# Patient Record
Sex: Male | Born: 1955 | Race: Black or African American | Hispanic: No | Marital: Single | Smoking: Never smoker
Health system: Southern US, Community
[De-identification: ages and names within clinical notes are randomized; demographics above are authoritative.]

---

## 2019-08-16 ENCOUNTER — Inpatient Hospital Stay (HOSPITAL_COMMUNITY): Payer: Self-pay

## 2019-08-16 ENCOUNTER — Inpatient Hospital Stay (HOSPITAL_COMMUNITY)
Admission: EM | Admit: 2019-08-16 | Discharge: 2019-09-12 | DRG: 870 | Disposition: E | Payer: Self-pay | Attending: Internal Medicine | Admitting: Internal Medicine

## 2019-08-16 ENCOUNTER — Emergency Department (HOSPITAL_COMMUNITY): Payer: Self-pay

## 2019-08-16 ENCOUNTER — Ambulatory Visit (HOSPITAL_COMMUNITY): Admit: 2019-08-16 | Payer: Self-pay | Admitting: Cardiology

## 2019-08-16 ENCOUNTER — Other Ambulatory Visit (HOSPITAL_COMMUNITY): Payer: Self-pay

## 2019-08-16 ENCOUNTER — Inpatient Hospital Stay (HOSPITAL_COMMUNITY)
Admission: EM | Admit: 2019-08-16 | Discharge: 2019-08-16 | Disposition: A | Discharge status: Still In Hospital | Payer: Self-pay | Source: Home / Self Care | Attending: Pulmonary Disease | Admitting: Pulmonary Disease

## 2019-08-16 ENCOUNTER — Encounter (HOSPITAL_COMMUNITY)
Admission: EM | Disposition: A | Discharge status: Still In Hospital | Payer: Self-pay | Source: Home / Self Care | Attending: Pulmonary Disease

## 2019-08-16 DIAGNOSIS — T85528A Displacement of other gastrointestinal prosthetic devices, implants and grafts, initial encounter: Secondary | ICD-10-CM

## 2019-08-16 DIAGNOSIS — Z452 Encounter for adjustment and management of vascular access device: Secondary | ICD-10-CM

## 2019-08-16 DIAGNOSIS — R0602 Shortness of breath: Secondary | ICD-10-CM

## 2019-08-16 DIAGNOSIS — E872 Acidosis: Secondary | ICD-10-CM | POA: Diagnosis present

## 2019-08-16 DIAGNOSIS — Z9889 Other specified postprocedural states: Secondary | ICD-10-CM

## 2019-08-16 DIAGNOSIS — R6521 Severe sepsis with septic shock: Secondary | ICD-10-CM | POA: Diagnosis present

## 2019-08-16 DIAGNOSIS — J690 Pneumonitis due to inhalation of food and vomit: Secondary | ICD-10-CM | POA: Diagnosis present

## 2019-08-16 DIAGNOSIS — R410 Disorientation, unspecified: Secondary | ICD-10-CM | POA: Diagnosis present

## 2019-08-16 DIAGNOSIS — J156 Pneumonia due to other aerobic Gram-negative bacteria: Secondary | ICD-10-CM | POA: Diagnosis present

## 2019-08-16 DIAGNOSIS — R404 Transient alteration of awareness: Secondary | ICD-10-CM

## 2019-08-16 DIAGNOSIS — J9383 Other pneumothorax: Secondary | ICD-10-CM | POA: Diagnosis present

## 2019-08-16 DIAGNOSIS — I742 Embolism and thrombosis of arteries of the upper extremities: Secondary | ICD-10-CM | POA: Diagnosis not present

## 2019-08-16 DIAGNOSIS — N179 Acute kidney failure, unspecified: Secondary | ICD-10-CM

## 2019-08-16 DIAGNOSIS — I469 Cardiac arrest, cause unspecified: Secondary | ICD-10-CM | POA: Diagnosis present

## 2019-08-16 DIAGNOSIS — Z6823 Body mass index (BMI) 23.0-23.9, adult: Secondary | ICD-10-CM

## 2019-08-16 DIAGNOSIS — Z7289 Other problems related to lifestyle: Secondary | ICD-10-CM

## 2019-08-16 DIAGNOSIS — I96 Gangrene, not elsewhere classified: Secondary | ICD-10-CM | POA: Diagnosis not present

## 2019-08-16 DIAGNOSIS — Z4682 Encounter for fitting and adjustment of non-vascular catheter: Secondary | ICD-10-CM

## 2019-08-16 DIAGNOSIS — A4153 Sepsis due to Serratia: Principal | ICD-10-CM | POA: Diagnosis present

## 2019-08-16 DIAGNOSIS — R680 Hypothermia, not associated with low environmental temperature: Secondary | ICD-10-CM | POA: Diagnosis present

## 2019-08-16 DIAGNOSIS — J948 Other specified pleural conditions: Secondary | ICD-10-CM

## 2019-08-16 DIAGNOSIS — Y838 Other surgical procedures as the cause of abnormal reaction of the patient, or of later complication, without mention of misadventure at the time of the procedure: Secondary | ICD-10-CM | POA: Diagnosis not present

## 2019-08-16 DIAGNOSIS — R6520 Severe sepsis without septic shock: Secondary | ICD-10-CM | POA: Diagnosis present

## 2019-08-16 DIAGNOSIS — R7401 Elevation of levels of liver transaminase levels: Secondary | ICD-10-CM | POA: Diagnosis present

## 2019-08-16 DIAGNOSIS — D649 Anemia, unspecified: Secondary | ICD-10-CM | POA: Diagnosis present

## 2019-08-16 DIAGNOSIS — E876 Hypokalemia: Secondary | ICD-10-CM | POA: Diagnosis not present

## 2019-08-16 DIAGNOSIS — Z9289 Personal history of other medical treatment: Secondary | ICD-10-CM

## 2019-08-16 DIAGNOSIS — J939 Pneumothorax, unspecified: Secondary | ICD-10-CM

## 2019-08-16 DIAGNOSIS — R451 Restlessness and agitation: Secondary | ICD-10-CM | POA: Diagnosis not present

## 2019-08-16 DIAGNOSIS — E43 Unspecified severe protein-calorie malnutrition: Secondary | ICD-10-CM

## 2019-08-16 DIAGNOSIS — L89811 Pressure ulcer of head, stage 1: Secondary | ICD-10-CM | POA: Diagnosis not present

## 2019-08-16 DIAGNOSIS — J9690 Respiratory failure, unspecified, unspecified whether with hypoxia or hypercapnia: Secondary | ICD-10-CM

## 2019-08-16 DIAGNOSIS — J9601 Acute respiratory failure with hypoxia: Secondary | ICD-10-CM | POA: Diagnosis present

## 2019-08-16 DIAGNOSIS — N19 Unspecified kidney failure: Secondary | ICD-10-CM

## 2019-08-16 DIAGNOSIS — J9602 Acute respiratory failure with hypercapnia: Secondary | ICD-10-CM | POA: Diagnosis present

## 2019-08-16 DIAGNOSIS — G931 Anoxic brain damage, not elsewhere classified: Secondary | ICD-10-CM | POA: Diagnosis present

## 2019-08-16 DIAGNOSIS — R739 Hyperglycemia, unspecified: Secondary | ICD-10-CM | POA: Diagnosis not present

## 2019-08-16 DIAGNOSIS — R0689 Other abnormalities of breathing: Secondary | ICD-10-CM

## 2019-08-16 DIAGNOSIS — Z789 Other specified health status: Secondary | ICD-10-CM

## 2019-08-16 DIAGNOSIS — J9811 Atelectasis: Secondary | ICD-10-CM | POA: Diagnosis not present

## 2019-08-16 DIAGNOSIS — I4892 Unspecified atrial flutter: Secondary | ICD-10-CM | POA: Diagnosis not present

## 2019-08-16 DIAGNOSIS — L89892 Pressure ulcer of other site, stage 2: Secondary | ICD-10-CM | POA: Diagnosis not present

## 2019-08-16 DIAGNOSIS — E162 Hypoglycemia, unspecified: Secondary | ICD-10-CM

## 2019-08-16 DIAGNOSIS — R64 Cachexia: Secondary | ICD-10-CM | POA: Diagnosis present

## 2019-08-16 DIAGNOSIS — T801XXA Vascular complications following infusion, transfusion and therapeutic injection, initial encounter: Secondary | ICD-10-CM | POA: Diagnosis not present

## 2019-08-16 DIAGNOSIS — Z515 Encounter for palliative care: Secondary | ICD-10-CM

## 2019-08-16 DIAGNOSIS — Z9911 Dependence on respirator [ventilator] status: Secondary | ICD-10-CM

## 2019-08-16 DIAGNOSIS — R791 Abnormal coagulation profile: Secondary | ICD-10-CM | POA: Diagnosis present

## 2019-08-16 DIAGNOSIS — Z20822 Contact with and (suspected) exposure to covid-19: Secondary | ICD-10-CM | POA: Diagnosis present

## 2019-08-16 DIAGNOSIS — J189 Pneumonia, unspecified organism: Secondary | ICD-10-CM

## 2019-08-16 DIAGNOSIS — G9341 Metabolic encephalopathy: Secondary | ICD-10-CM | POA: Diagnosis present

## 2019-08-16 DIAGNOSIS — R34 Anuria and oliguria: Secondary | ICD-10-CM | POA: Diagnosis present

## 2019-08-16 DIAGNOSIS — A419 Sepsis, unspecified organism: Secondary | ICD-10-CM | POA: Diagnosis present

## 2019-08-16 DIAGNOSIS — Z781 Physical restraint status: Secondary | ICD-10-CM

## 2019-08-16 DIAGNOSIS — I4891 Unspecified atrial fibrillation: Secondary | ICD-10-CM | POA: Diagnosis not present

## 2019-08-16 DIAGNOSIS — J984 Other disorders of lung: Secondary | ICD-10-CM | POA: Diagnosis present

## 2019-08-16 DIAGNOSIS — N170 Acute kidney failure with tubular necrosis: Secondary | ICD-10-CM | POA: Diagnosis present

## 2019-08-16 DIAGNOSIS — J95812 Postprocedural air leak: Secondary | ICD-10-CM | POA: Diagnosis not present

## 2019-08-16 DIAGNOSIS — I468 Cardiac arrest due to other underlying condition: Secondary | ICD-10-CM | POA: Diagnosis present

## 2019-08-16 DIAGNOSIS — R601 Generalized edema: Secondary | ICD-10-CM | POA: Diagnosis not present

## 2019-08-16 DIAGNOSIS — E870 Hyperosmolality and hypernatremia: Secondary | ICD-10-CM | POA: Diagnosis present

## 2019-08-16 DIAGNOSIS — G319 Degenerative disease of nervous system, unspecified: Secondary | ICD-10-CM | POA: Diagnosis present

## 2019-08-16 DIAGNOSIS — J9600 Acute respiratory failure, unspecified whether with hypoxia or hypercapnia: Secondary | ICD-10-CM

## 2019-08-16 DIAGNOSIS — Z9689 Presence of other specified functional implants: Secondary | ICD-10-CM

## 2019-08-16 DIAGNOSIS — F919 Conduct disorder, unspecified: Secondary | ICD-10-CM | POA: Diagnosis present

## 2019-08-16 DIAGNOSIS — Z66 Do not resuscitate: Secondary | ICD-10-CM | POA: Diagnosis not present

## 2019-08-16 DIAGNOSIS — R0600 Dyspnea, unspecified: Secondary | ICD-10-CM

## 2019-08-16 DIAGNOSIS — L8990 Pressure ulcer of unspecified site, unspecified stage: Secondary | ICD-10-CM | POA: Insufficient documentation

## 2019-08-16 DIAGNOSIS — T82594A Other mechanical complication of infusion catheter, initial encounter: Secondary | ICD-10-CM

## 2019-08-16 DIAGNOSIS — J90 Pleural effusion, not elsewhere classified: Secondary | ICD-10-CM | POA: Diagnosis present

## 2019-08-16 DIAGNOSIS — K7201 Acute and subacute hepatic failure with coma: Secondary | ICD-10-CM | POA: Diagnosis present

## 2019-08-16 LAB — CBC WITH DIFFERENTIAL/PLATELET
Abs Immature Granulocytes: 0.4 10*3/uL — ABNORMAL HIGH (ref 0.00–0.07)
Basophils Absolute: 0 10*3/uL (ref 0.0–0.1)
Basophils Relative: 0 %
Eosinophils Absolute: 0 10*3/uL (ref 0.0–0.5)
Eosinophils Relative: 0 %
HCT: 46.2 % (ref 39.0–52.0)
Hemoglobin: 13.8 g/dL (ref 13.0–17.0)
Lymphocytes Relative: 3 %
Lymphs Abs: 1.2 10*3/uL (ref 0.7–4.0)
MCH: 32.8 pg (ref 26.0–34.0)
MCHC: 29.9 g/dL — ABNORMAL LOW (ref 30.0–36.0)
MCV: 109.7 fL — ABNORMAL HIGH (ref 80.0–100.0)
Monocytes Absolute: 1.6 10*3/uL — ABNORMAL HIGH (ref 0.1–1.0)
Monocytes Relative: 4 %
Neutro Abs: 37.6 10*3/uL — ABNORMAL HIGH (ref 1.7–7.7)
Neutrophils Relative %: 92 %
Platelets: 476 10*3/uL — ABNORMAL HIGH (ref 150–400)
Promyelocytes Relative: 1 %
RBC: 4.21 MIL/uL — ABNORMAL LOW (ref 4.22–5.81)
RDW: 13.5 % (ref 11.5–15.5)
WBC: 40.9 10*3/uL — ABNORMAL HIGH (ref 4.0–10.5)
nRBC: 0 /100 WBC
nRBC: 0.2 % (ref 0.0–0.2)

## 2019-08-16 LAB — LACTIC ACID, PLASMA
Lactic Acid, Venous: >11 mmol/L (ref 0.5–1.9)
Lactic Acid, Venous: >11 mmol/L (ref 0.5–1.9)

## 2019-08-16 LAB — RESPIRATORY PANEL BY RT PCR (FLU A&B, COVID)
Influenza A by PCR: NEGATIVE
Influenza B by PCR: NEGATIVE
SARS Coronavirus 2 by RT PCR: NEGATIVE

## 2019-08-16 LAB — COMPREHENSIVE METABOLIC PANEL
ALT: 103 U/L — ABNORMAL HIGH (ref 0–44)
AST: 395 U/L — ABNORMAL HIGH (ref 15–41)
Albumin: 2.2 g/dL — ABNORMAL LOW (ref 3.5–5.0)
Alkaline Phosphatase: 80 U/L (ref 38–126)
Anion gap: 29 — ABNORMAL HIGH (ref 5–15)
BUN: 34 mg/dL — ABNORMAL HIGH (ref 8–23)
CO2: 8 mmol/L — ABNORMAL LOW (ref 22–32)
Calcium: 7.9 mg/dL — ABNORMAL LOW (ref 8.9–10.3)
Chloride: 99 mmol/L (ref 98–111)
Creatinine, Ser: 4.69 mg/dL — ABNORMAL HIGH (ref 0.61–1.24)
GFR calc Af Amer: 14 mL/min — ABNORMAL LOW (ref >60)
GFR calc non Af Amer: 12 mL/min — ABNORMAL LOW (ref >60)
Glucose, Bld: 57 mg/dL — ABNORMAL LOW (ref 70–99)
Potassium: 5.6 mmol/L — ABNORMAL HIGH (ref 3.5–5.1)
Sodium: 136 mmol/L (ref 135–145)
Total Bilirubin: 1.1 mg/dL (ref 0.3–1.2)
Total Protein: 7.2 g/dL (ref 6.5–8.1)

## 2019-08-16 LAB — I-STAT CHEM 8, ED
BUN: 37 mg/dL — ABNORMAL HIGH (ref 8–23)
Calcium, Ion: 0.9 mmol/L — ABNORMAL LOW (ref 1.15–1.40)
Chloride: 112 mmol/L — ABNORMAL HIGH (ref 98–111)
Creatinine, Ser: 4.6 mg/dL — ABNORMAL HIGH (ref 0.61–1.24)
Glucose, Bld: 49 mg/dL — ABNORMAL LOW (ref 70–99)
HCT: 45 % (ref 39.0–52.0)
Hemoglobin: 15.3 g/dL (ref 13.0–17.0)
Potassium: 5.1 mmol/L (ref 3.5–5.1)
Sodium: 132 mmol/L — ABNORMAL LOW (ref 135–145)
TCO2: 11 mmol/L — ABNORMAL LOW (ref 22–32)

## 2019-08-16 LAB — POCT I-STAT 7, (LYTES, BLD GAS, ICA,H+H)
Acid-base deficit: 22 mmol/L — ABNORMAL HIGH (ref 0.0–2.0)
Acid-base deficit: 15 mmol/L — ABNORMAL HIGH (ref 0.0–2.0)
Acid-base deficit: 8 mmol/L — ABNORMAL HIGH (ref 0.0–2.0)
Bicarbonate: 10.3 mmol/L — ABNORMAL LOW (ref 20.0–28.0)
Bicarbonate: 11.1 mmol/L — ABNORMAL LOW (ref 20.0–28.0)
Bicarbonate: 16.3 mmol/L — ABNORMAL LOW (ref 20.0–28.0)
Calcium, Ion: 1.05 mmol/L — ABNORMAL LOW (ref 1.15–1.40)
Calcium, Ion: 1.04 mmol/L — ABNORMAL LOW (ref 1.15–1.40)
Calcium, Ion: 1.07 mmol/L — ABNORMAL LOW (ref 1.15–1.40)
HCT: 44 % (ref 39.0–52.0)
HCT: 43 % (ref 39.0–52.0)
HCT: 45 % (ref 39.0–52.0)
Hemoglobin: 15 g/dL (ref 13.0–17.0)
Hemoglobin: 14.6 g/dL (ref 13.0–17.0)
Hemoglobin: 15.3 g/dL (ref 13.0–17.0)
O2 Saturation: 100 %
O2 Saturation: 94 %
O2 Saturation: 97 %
Patient temperature: 95.1
Patient temperature: 93.2
Patient temperature: 36.1
Potassium: 5.3 mmol/L — ABNORMAL HIGH (ref 3.5–5.1)
Potassium: 4.2 mmol/L (ref 3.5–5.1)
Potassium: 4.2 mmol/L (ref 3.5–5.1)
Sodium: 131 mmol/L — ABNORMAL LOW (ref 135–145)
Sodium: 136 mmol/L (ref 135–145)
Sodium: 139 mmol/L (ref 135–145)
TCO2: 12 mmol/L — ABNORMAL LOW (ref 22–32)
TCO2: 12 mmol/L — ABNORMAL LOW (ref 22–32)
TCO2: 17 mmol/L — ABNORMAL LOW (ref 22–32)
pCO2 arterial: 42 mmHg (ref 32.0–48.0)
pCO2 arterial: 24.2 mmHg — ABNORMAL LOW (ref 32.0–48.0)
pCO2 arterial: 29.9 mmHg — ABNORMAL LOW (ref 32.0–48.0)
pH, Arterial: 6.985 — CL (ref 7.350–7.450)
pH, Arterial: 7.251 — ABNORMAL LOW (ref 7.350–7.450)
pH, Arterial: 7.34 — ABNORMAL LOW (ref 7.350–7.450)
pO2, Arterial: 296 mmHg — ABNORMAL HIGH (ref 83.0–108.0)
pO2, Arterial: 69 mmHg — ABNORMAL LOW (ref 83.0–108.0)
pO2, Arterial: 94 mmHg (ref 83.0–108.0)

## 2019-08-16 LAB — ABO/RH: ABO/RH(D): A POS

## 2019-08-16 LAB — GLUCOSE, CAPILLARY: Glucose-Capillary: 131 mg/dL — ABNORMAL HIGH (ref 70–99)

## 2019-08-16 LAB — TROPONIN I (HIGH SENSITIVITY)
Troponin I (High Sensitivity): 40 ng/L — ABNORMAL HIGH (ref <18)
Troponin I (High Sensitivity): 57 ng/L — ABNORMAL HIGH (ref <18)

## 2019-08-16 LAB — ACETAMINOPHEN LEVEL: Acetaminophen (Tylenol), Serum: <10 ug/mL — ABNORMAL LOW (ref 10–30)

## 2019-08-16 LAB — CBG MONITORING, ED
Glucose-Capillary: 29 mg/dL — CL (ref 70–99)
Glucose-Capillary: 79 mg/dL (ref 70–99)
Glucose-Capillary: 111 mg/dL — ABNORMAL HIGH (ref 70–99)
Glucose-Capillary: 154 mg/dL — ABNORMAL HIGH (ref 70–99)

## 2019-08-16 LAB — PROTIME-INR
INR: 2 — ABNORMAL HIGH (ref 0.8–1.2)
Prothrombin Time: 23 seconds — ABNORMAL HIGH (ref 11.4–15.2)

## 2019-08-16 LAB — TYPE AND SCREEN
ABO/RH(D): A POS
Antibody Screen: NEGATIVE

## 2019-08-16 LAB — SALICYLATE LEVEL: Salicylate Lvl: <7 mg/dL — ABNORMAL LOW (ref 7.0–30.0)

## 2019-08-16 LAB — ETHANOL: Alcohol, Ethyl (B): <10 mg/dL (ref <10)

## 2019-08-16 LAB — BRAIN NATRIURETIC PEPTIDE: B Natriuretic Peptide: 1142.9 pg/mL — ABNORMAL HIGH (ref 0.0–100.0)

## 2019-08-16 LAB — MRSA PCR SCREENING: MRSA by PCR: NEGATIVE

## 2019-08-16 LAB — LIPASE, BLOOD: Lipase: 19 U/L (ref 11–51)

## 2019-08-16 SURGERY — LEFT HEART CATH AND CORONARY ANGIOGRAPHY
Anesthesia: LOCAL

## 2019-08-16 MED ORDER — FOLIC ACID 1 MG PO TABS: 1.0000 mg | ORAL_TABLET | Freq: Every day | ORAL | Status: DC

## 2019-08-16 MED ORDER — HEPARIN SODIUM (PORCINE) 5000 UNIT/ML IJ SOLN
5000.0000 [IU] | Freq: Three times a day (TID) | INTRAMUSCULAR | Status: DC
  Administered 2019-08-16 – 2019-08-28 (×34): 5000 [IU] via SUBCUTANEOUS
  Filled 2019-08-16 (×34): qty 1

## 2019-08-16 MED ORDER — VANCOMYCIN HCL IN DEXTROSE 1-5 GM/200ML-% IV SOLN: 1000.0000 mg | Freq: Once | INTRAVENOUS | Status: DC

## 2019-08-16 MED ORDER — HEPARIN SODIUM (PORCINE) 5000 UNIT/ML IJ SOLN: 5000.0000 [IU] | Freq: Three times a day (TID) | INTRAMUSCULAR | Status: DC

## 2019-08-16 MED ORDER — FENTANYL CITRATE (PF) 100 MCG/2ML IJ SOLN
50.0000 ug | Freq: Once | INTRAMUSCULAR | Status: AC
  Administered 2019-08-16: 50 ug via INTRAVENOUS
  Filled 2019-08-16: qty 2

## 2019-08-16 MED ORDER — MIDAZOLAM HCL 2 MG/2ML IJ SOLN
2.0000 mg | INTRAMUSCULAR | Status: DC | PRN
  Administered 2019-08-17 – 2019-08-20 (×7): 2 mg via INTRAVENOUS
  Filled 2019-08-16 (×7): qty 2

## 2019-08-16 MED ORDER — AMIODARONE HCL IN DEXTROSE 360-4.14 MG/200ML-% IV SOLN
60.0000 mg/h | INTRAVENOUS | Status: DC
  Administered 2019-08-16: 19:00:00 60 mg/h via INTRAVENOUS
  Filled 2019-08-16: qty 200

## 2019-08-16 MED ORDER — NOREPINEPHRINE 16 MG/250ML-% IV SOLN
0.0000 ug/min | INTRAVENOUS | Status: DC
  Administered 2019-08-16: 5 ug/min via INTRAVENOUS
  Administered 2019-08-17: 50 ug/min via INTRAVENOUS
  Administered 2019-08-17: 5 ug/min via INTRAVENOUS
  Administered 2019-08-17 (×2): 50 ug/min via INTRAVENOUS
  Administered 2019-08-18: 15:00:00 38 ug/min via INTRAVENOUS
  Administered 2019-08-18: 07:00:00 25 ug/min via INTRAVENOUS
  Administered 2019-08-19: 09:00:00 36 ug/min via INTRAVENOUS
  Administered 2019-08-19: 18:00:00 27 ug/min via INTRAVENOUS
  Administered 2019-08-19: 36 ug/min via INTRAVENOUS
  Administered 2019-08-20: 10 ug/min via INTRAVENOUS
  Filled 2019-08-16 (×7): qty 250
  Filled 2019-08-16: qty 500
  Filled 2019-08-16 (×3): qty 250

## 2019-08-16 MED ORDER — SODIUM BICARBONATE 8.4 % IV SOLN
INTRAVENOUS | Status: AC
  Filled 2019-08-16: qty 100

## 2019-08-16 MED ORDER — ETOMIDATE 2 MG/ML IV SOLN
INTRAVENOUS | Status: DC | PRN
  Administered 2019-08-16: 20 mg via INTRAVENOUS

## 2019-08-16 MED ORDER — LACTATED RINGERS IV BOLUS
1000.0000 mL | Freq: Once | INTRAVENOUS | Status: AC
  Administered 2019-08-16: 22:00:00 250 mL via INTRAVENOUS

## 2019-08-16 MED ORDER — FENTANYL CITRATE (PF) 100 MCG/2ML IJ SOLN: 50.0000 ug | INTRAMUSCULAR | Status: DC | PRN

## 2019-08-16 MED ORDER — SODIUM BICARBONATE 8.4 % IV SOLN
50.0000 meq | Freq: Once | INTRAVENOUS | Status: AC
  Administered 2019-08-16: 17:00:00 50 meq via INTRAVENOUS
  Filled 2019-08-16: qty 50

## 2019-08-16 MED ORDER — VANCOMYCIN VARIABLE DOSE PER UNSTABLE RENAL FUNCTION (PHARMACIST DOSING): Status: DC

## 2019-08-16 MED ORDER — VANCOMYCIN HCL IN DEXTROSE 1-5 GM/200ML-% IV SOLN
1000.0000 mg | Freq: Once | INTRAVENOUS | Status: AC
  Administered 2019-08-16: 1000 mg via INTRAVENOUS
  Filled 2019-08-16: qty 200

## 2019-08-16 MED ORDER — NOREPINEPHRINE 4 MG/250ML-% IV SOLN: 0.0000 ug/min | INTRAVENOUS | Status: DC

## 2019-08-16 MED ORDER — DILTIAZEM HCL-DEXTROSE 125-5 MG/125ML-% IV SOLN (PREMIX)
5.0000 mg/h | INTRAVENOUS | Status: DC
  Administered 2019-08-16: 16:00:00 5 mg/h via INTRAVENOUS
  Filled 2019-08-16: qty 125

## 2019-08-16 MED ORDER — BISACODYL 10 MG RE SUPP: 10.0000 mg | Freq: Every day | RECTAL | Status: DC | PRN

## 2019-08-16 MED ORDER — AMIODARONE LOAD VIA INFUSION
150.0000 mg | Freq: Once | INTRAVENOUS | Status: AC
  Administered 2019-08-16: 19:00:00 150 mg via INTRAVENOUS
  Filled 2019-08-16: qty 83.34

## 2019-08-16 MED ORDER — MIDAZOLAM HCL 2 MG/2ML IJ SOLN: 2.0000 mg | INTRAMUSCULAR | Status: DC | PRN

## 2019-08-16 MED ORDER — THIAMINE HCL 100 MG/ML IJ SOLN: 100.0000 mg | Freq: Every day | INTRAMUSCULAR | Status: DC

## 2019-08-16 MED ORDER — PROPOFOL 1000 MG/100ML IV EMUL
INTRAVENOUS | Status: AC
  Filled 2019-08-16: qty 100

## 2019-08-16 MED ORDER — SODIUM CHLORIDE 0.9 % IV SOLN: 2.0000 g | INTRAVENOUS | Status: DC

## 2019-08-16 MED ORDER — SODIUM CHLORIDE 0.9 % IV SOLN
80.0000 mg | Freq: Once | INTRAVENOUS | Status: DC
  Filled 2019-08-16: qty 80

## 2019-08-16 MED ORDER — SODIUM CHLORIDE 0.9 % IV SOLN
8.0000 mg/h | INTRAVENOUS | Status: DC
  Filled 2019-08-16: qty 80

## 2019-08-16 MED ORDER — SODIUM CHLORIDE 0.9 % IV SOLN
80.0000 mg | Freq: Once | INTRAVENOUS | Status: AC
  Administered 2019-08-16: 80 mg via INTRAVENOUS
  Filled 2019-08-16: qty 80

## 2019-08-16 MED ORDER — SODIUM CHLORIDE 0.9 % IV SOLN
2.0000 g | INTRAVENOUS | Status: DC
  Administered 2019-08-16 – 2019-08-19 (×4): 2 g via INTRAVENOUS
  Filled 2019-08-16 (×5): qty 2

## 2019-08-16 MED ORDER — AMIODARONE HCL IN DEXTROSE 360-4.14 MG/200ML-% IV SOLN
30.0000 mg/h | INTRAVENOUS | Status: DC
  Administered 2019-08-17 – 2019-08-20 (×6): 30 mg/h via INTRAVENOUS
  Filled 2019-08-16 (×5): qty 200

## 2019-08-16 MED ORDER — DOCUSATE SODIUM 50 MG/5ML PO LIQD
100.0000 mg | Freq: Two times a day (BID) | ORAL | Status: DC | PRN
  Filled 2019-08-16: qty 10

## 2019-08-16 MED ORDER — NOREPINEPHRINE 4 MG/250ML-% IV SOLN
0.0000 ug/min | INTRAVENOUS | Status: DC
  Administered 2019-08-16: 22:00:00 5 ug/min via INTRAVENOUS
  Filled 2019-08-16: qty 250

## 2019-08-16 MED ORDER — MIDAZOLAM HCL 2 MG/2ML IJ SOLN
2.0000 mg | INTRAMUSCULAR | Status: AC | PRN
  Administered 2019-08-16 – 2019-08-18 (×3): 2 mg via INTRAVENOUS
  Filled 2019-08-16 (×4): qty 2

## 2019-08-16 MED ORDER — DEXTROSE 50 % IV SOLN
INTRAVENOUS | Status: DC | PRN
  Administered 2019-08-16: 1 via INTRAVENOUS

## 2019-08-16 MED ORDER — AMIODARONE HCL IN DEXTROSE 360-4.14 MG/200ML-% IV SOLN
60.0000 mg/h | INTRAVENOUS | Status: AC
  Administered 2019-08-16 – 2019-08-17 (×3): 60 mg/h via INTRAVENOUS
  Filled 2019-08-16: qty 200

## 2019-08-16 MED ORDER — SODIUM BICARBONATE 8.4 % IV SOLN
INTRAVENOUS | Status: DC
  Filled 2019-08-16 (×5): qty 100

## 2019-08-16 MED ORDER — FENTANYL 2500MCG IN NS 250ML (10MCG/ML) PREMIX INFUSION
50.0000 ug/h | INTRAVENOUS | Status: DC
  Administered 2019-08-16: 21:00:00 100 ug/h via INTRAVENOUS
  Administered 2019-08-16: 23:00:00 50 ug/h via INTRAVENOUS
  Administered 2019-08-17: 12:00:00 200 ug/h via INTRAVENOUS
  Administered 2019-08-18 – 2019-08-19 (×4): 300 ug/h via INTRAVENOUS
  Administered 2019-08-20: 50 ug/h via INTRAVENOUS
  Administered 2019-08-20: 125 ug/h via INTRAVENOUS
  Administered 2019-08-21: 200 ug/h via INTRAVENOUS
  Filled 2019-08-16 (×11): qty 250

## 2019-08-16 MED ORDER — PANTOPRAZOLE SODIUM 40 MG IV SOLR: 40.0000 mg | Freq: Two times a day (BID) | INTRAVENOUS | Status: DC

## 2019-08-16 MED ORDER — SODIUM BICARBONATE 8.4 % IV SOLN: INTRAVENOUS | Status: DC

## 2019-08-16 MED ORDER — ASPIRIN 300 MG RE SUPP
300.0000 mg | RECTAL | Status: AC
  Administered 2019-08-16: 23:00:00 300 mg via RECTAL
  Filled 2019-08-16: qty 1

## 2019-08-16 MED ORDER — SODIUM CHLORIDE 0.9 % IV SOLN
8.0000 mg/h | INTRAVENOUS | Status: DC
  Administered 2019-08-16 – 2019-08-19 (×6): 8 mg/h via INTRAVENOUS
  Filled 2019-08-16 (×6): qty 80

## 2019-08-16 MED ORDER — SODIUM CHLORIDE 0.9 % IV BOLUS
1000.0000 mL | Freq: Once | INTRAVENOUS | Status: AC
  Administered 2019-08-16: 17:00:00 1000 mL via INTRAVENOUS

## 2019-08-16 MED ORDER — SUCCINYLCHOLINE CHLORIDE 20 MG/ML IJ SOLN
INTRAMUSCULAR | Status: DC | PRN
  Administered 2019-08-16: 150 mg via INTRAVENOUS

## 2019-08-16 MED ORDER — PHENYLEPHRINE HCL-NACL 10-0.9 MG/250ML-% IV SOLN
0.0000 ug/min | INTRAVENOUS | Status: DC
  Filled 2019-08-16: qty 250

## 2019-08-16 MED ORDER — CALCIUM GLUCONATE-NACL 1-0.675 GM/50ML-% IV SOLN
1.0000 g | Freq: Once | INTRAVENOUS | Status: AC
  Administered 2019-08-16: 18:00:00 1000 mg via INTRAVENOUS
  Filled 2019-08-16: qty 50

## 2019-08-16 MED ORDER — SODIUM BICARBONATE-DEXTROSE 150-5 MEQ/L-% IV SOLN
150.0000 meq | INTRAVENOUS | Status: DC
  Filled 2019-08-16: qty 1000

## 2019-08-16 MED ORDER — SODIUM CHLORIDE 0.9 % IV SOLN: INTRAVENOUS | Status: DC

## 2019-08-16 MED ORDER — PROPOFOL 1000 MG/100ML IV EMUL
5.0000 ug/kg/min | INTRAVENOUS | Status: DC
  Administered 2019-08-16: 16:00:00 5 ug/kg/min via INTRAVENOUS

## 2019-08-16 MED ORDER — AMIODARONE HCL IN DEXTROSE 360-4.14 MG/200ML-% IV SOLN: 30.0000 mg/h | INTRAVENOUS | Status: DC

## 2019-08-16 MED ORDER — FENTANYL BOLUS VIA INFUSION
50.0000 ug | INTRAVENOUS | Status: DC | PRN
  Administered 2019-08-17 – 2019-08-18 (×5): 50 ug via INTRAVENOUS
  Filled 2019-08-16: qty 50

## 2019-08-16 MED ORDER — MAGNESIUM SULFATE 2 GM/50ML IV SOLN
2.0000 g | Freq: Once | INTRAVENOUS | Status: AC
  Administered 2019-08-16: 18:00:00 2 g via INTRAVENOUS
  Filled 2019-08-16: qty 50

## 2019-08-16 NOTE — Progress Notes (Signed)
RT unsuccessful at Arterial Line. CCM at bedside to place.

## 2019-08-16 NOTE — Progress Notes (Signed)
Pharmacy Antibiotic Note  Philip Wells is a 64 y.o. male admitted on Aug 29, 2019 with post-cardiac arrest. Pharmacy has been consulted for vancomycin and cefepime dosing. TTM 36 degrees protocol underway. SCr 4.7 on admit.  **Note: orders previously placed on different patient during day-shift due to admission MRN error. Per MAR and nursing, no vancomycin or cefepime administered.  Plan: -Cefepime 2g IV q24hr  -Vancomycin 1000mg  IV x1 -F/U renal function in am for further doses   Height: 5\' 11"  (180.3 cm) Weight: 127 lb 5.1 oz (57.7 kg) IBW/kg (Calculated) : 75.3  Temp (24hrs), Avg:93.2 F (34 C), Min:93.2 F (34 C), Max:93.2 F (34 C)  No results for input(s): WBC, CREATININE, LATICACIDVEN, VANCOTROUGH, VANCOPEAK, VANCORANDOM, GENTTROUGH, GENTPEAK, GENTRANDOM, TOBRATROUGH, TOBRAPEAK, TOBRARND, AMIKACINPEAK, AMIKACINTROU, AMIKACIN in the last 168 hours.  CrCl cannot be calculated (No successful lab value found.).    No Known Allergies  Antimicrobials this admission: Cefepime 2/2 >>  Vancomycin 2/2 >>   , PharmD, BCPS Clinical Pharmacist [phone] Please check AMION for all [facility] Pharmacy numbers 08/29/19

## 2019-08-16 NOTE — Progress Notes (Addendum)
eLink Physician-Brief Progress Note Patient Name: Davarion Cuffee DOB: 19-Nov-1955 MRN: [id number]   Date of Service  2019-09-09  HPI/Events of Note  Called by Radiology - Patient has large L hydro pneumothorax.   eICU Interventions  Will notify PCCM ground team of radiology findings.      Intervention Category Major Interventions: Other:  Lenell Antu September 09, 2019, 8:40 PM

## 2019-08-16 NOTE — Plan of Care (Signed)
PCCM family communication note  I called the patient's wife, Ms. Booty, at number provided in chart. She verified name and DOB of her husband. I informed her of his presence in the patient in the ED. She states she will come to the hospital and ended the phone call.   Tessie Fass MSN, AGACNP-BC [place] [facility] [phone] If no answer, [phone] 08/20/2019, 6:23 PM

## 2019-08-16 NOTE — Progress Notes (Signed)
Responded to ED page to support patient and staff. Pt arrives via EMS from home as post CPR. Pt found my child and reports limited downtime. Pt. Going to get scan then possible admission.  No direct contact with patient.  Chaplain available as needed.  Venida Jarvis, [place], [facility], Pager [phone]

## 2019-08-16 NOTE — Procedures (Signed)
Central Venous Catheter Insertion Procedure Note Philip Wells [facility] [id number] 17-Sep-1955  Procedure: Insertion of Central Venous Catheter Indications: Drug and/or fluid administration  Procedure Details Consent: Unable to obtain consent because of altered level of consciousness. Time Out: Verified patient identification, verified procedure, site/side was marked, verified correct patient position, special equipment/implants available, medications/allergies/relevent history reviewed, required imaging and test results available.  Performed  Maximum sterile technique was used including antiseptics, cap, gloves, gown, hand hygiene, mask and sheet. Skin prep: Chlorhexidine; local anesthetic administered A antimicrobial bonded/coated triple lumen catheter was placed in the left subclavian vein using the Seldinger technique.  Evaluation Blood flow good Complications: No apparent complications Patient did tolerate procedure well. Chest X-ray ordered to verify placement.  CXR: normal.  Philip Wells 09/06/2019, 9:29 PM

## 2019-08-16 NOTE — ED Notes (Signed)
Delay in blood cultures due to central line placement procedure

## 2019-08-16 NOTE — Progress Notes (Signed)
Pharmacy Antibiotic Note  Philip Wells is a 64 y.o. male admitted on 09/09/19 post-cardiac arrest.  Pharmacy has been consulted for vancomycin and cefepime dosing for suspected sepsis.   WBC elevated at 40 on admission. Patient hypothermic. Variable vancomycin dosing ordered d/t possible acute renal failure with Scr of 4.6. Pharmacy will follow for further dosing.   Plan: Cefepime 2g IV q24hr  Vancomycin loading dose 1000 mg IV x1 Variable vancomycin dosing ordered; will f/u maintenance dose Monitor clinical improvement, c/s, vancomycin levels at steady state as needed  Height: 5\' 6"  (167.6 cm) Weight: 130 lb (59 kg) IBW/kg (Calculated) : 63.8  Temp (24hrs), Avg:93.9 F (34.4 C), Min:91.7 F (33.2 C), Max:95.1 F (35.1 C)  Recent Labs  Lab 09-09-19 1543 2019-09-09 1544 09-09-19 1554  WBC 40.9*  --   --   CREATININE 4.69*  --  4.60*  LATICACIDVEN  --  >11.0*  --     Estimated Creatinine Clearance: 13.7 mL/min (A) (by C-G formula based on SCr of 4.6 mg/dL (H)).    No Known Allergies  Antimicrobials this admission: Vancomcyin 2/2 >>  Cefepime 2/2 >>   Dose adjustments this admission: Vancomycin Cefepime  Microbiology results: 2/2 BCx: sent 2/2 UCx: sent  2/2 Resp Cx: sent  Thank you for allowing pharmacy to be a part of this patient's care.  10/14/19, PharmD PGY1 Pharmacy Resident

## 2019-08-16 NOTE — ED Triage Notes (Signed)
Pt arrives via EMS from home as post CPR. Pt found my child and reports limited donwtime. 6 rounds of chest compressions before ROSC given by Fire. 100 mcg fentanyl and 5 versed given by EMS. King airway.

## 2019-08-16 NOTE — H&P (Deleted)
NAME:  Philip Wells, MRN:  [id number], DOB:  07/01/1957 (verified [birth year]), LOS: 0 ADMISSION DATE:  08/25/2019, CONSULTATION DATE:   REFERRING MD:  Dr. Judd Lien, CHIEF COMPLAINT:  Cardiac arrest   Brief History   12 yoM presenting post cardiac arrest.  Little known but apparently found down, unknown down time, ROSC after 6 rounds of CPR, no shock advised.  Initial temp 33 celsius.  Since has remained unresponsive and progressively hypotensive complicated by afib with RVR, severe metabolic acidosis, and multiple metabolic derangements.    History of present illness   HPI obtained from sister at bedside, Philip Wells [phone].  Patient's initial registration was different [employer].  Sister verifies patient's DOB of Apr 19, 1956. MRN [id number] is not the correct patient.   Per sister, patient is 36 year AA male with no known past medical history. NKDA, no known medications, non smoker, unknown drug history, and does drink wine. Patient lives with his girlfriend, Philip Wells in [place].  Story is unclear, but he was found unresponsive, unclear time down.  CPR performed by Fire department, reportedly 6 rounds of CPR with non-shockable rhythm prior to ROSC with [facility] airway placed.    In ER, patient remained unresponsive, initial temp 33 degree celsius, initially normotensive, hypoglycemia, and in atrial tachycardia.  King airway replaced by ETT in ER.    Initially in coded by EMS as possible STEMI, however on cardiology eval in ER, initial rhythm showing aflutter with ratge 140s and STE in V3-4 which had resolved on arrival to ER.  Placed on cardizem gtt for rate control.    Workup noted for WBC 41K, Hgb 13.8, platelets 476, K 5.6, CO2 8, BUN 34, sCr 4.69, corrected Ca 9.3, glucose 57, AST 395, ALT 103, BNP 1142, trop hs 40, lactic  >11, INR 2, neg ETOH, neg tylenol/ salicylate level, SARS2/ flu neg, CXR with good placement of ETT/ OGT, and nonspecific right perihilar density in which    CT was recommended for followup.  CTH pending.  Patient treated D50, finishing 3L NS, and multiple bicarb pushes.  PCCM asked to admit.   Past Medical History  ETOH use- wine  Significant Hospital Events   2/2 Admit  Consults:  Cardiology in ER  Procedures:  2/2 ETT >> 2/2 foley >> 2/2 R [place] CVL >>  Significant Diagnostic Tests:  2/2 CXR >> 1. Right perihilar density, differential include scarring versus parenchymal lung mass. CT chest with IV contrast may be useful for further evaluation when clinical situation permits. 2. No complication after intubation. 3. Otherwise no acute process.  2/2 CTH >>  Micro Data:  2/2 SARS 2 / Flu A/b >> neg 2/2 BCx 2 >> 2/2 UC >> 2/2 trach asp >>  Antimicrobials:  2/2 vanc >> 2/2 cefepime >>  Interim history/subjective:    Objective   Blood pressure 116/72, pulse (!) 133, temperature (!) 93.9 F (34.4 C), resp. rate (!) 27, height 5\' 6"  (1.676 m), weight 59 kg, SpO2 96 %.    Vent Mode: PRVC FiO2 (%):  [100 %] 100 % Set Rate:  [16 bmp] 16 bmp Vt Set:  [510 mL] 510 mL PEEP:  [5 cmH20] 5 cmH20 Plateau Pressure:  [19 cmH20] 19 cmH20   Intake/Output Summary (Last 24 hours) at  1743 Last data filed at 08/23/2019 1738 Gross per 24 hour  Intake 1000 ml  Output --  Net 1000 ml   Filed Weights   09/08/2019 1553  Weight: 59 kg    Examination:  General:  Thin older male unresponsive on MV HEENT: MM pink/ dry, pupils 3/ reactive, absent corneals, ETT, OGT - with frank bloody secretions ~250 ml thus far Neuro:  Unresponsive, minimal reflex/ posturing in LE with noxious stimuli CV: irir, no murmur  PULM:  Breathing over MV rate, diffuse rhonchi, scattered wheeze GI: soft, bs hypoactive  Extremities: cool /dry, no edema  Skin: no rashes   Resolved Hospital Problem list    Assessment & Plan:   Cardiac Arrest - unclear etiology Hypotension P:  ICU admit [place] CVL being placed Check CVP Neo as needed for goal MAP >  65 Check CVP TTE in am  Trend troponin, lactic Assess cortisol  Check CK    Acute encephalopathy  Hx of ETOH use, wine - rule out toxic/ metabolic, concerning for anoxic injury as well as patient remains off sedation thus far without neurological improvement  P:  TTM 36, as patient is already cooled (poor prognostic indicator) and multiple metabolic derangements freuqent neuro exams LTM Correct metabolic derangements CTH pending official read, no obvious ICH, large ventricles  Consider MRI brain  Checking UDS  Empiric thiamine/ folate    Leukocytosis concerning for septic shock  - unclear etiology at this point P:  Pan culture UA pending  Empiric vanc / cefepime CT abd/ pelvis to look for possible infection Trend WBC./ fever curve    Hypoxic respiratory failure Right perihilar density P:  Full MV support, PRVC 8 cc/kg, rate 28 Wean FiO2 for sat goal > 94% ABG in 1 hour   VAP bundle Trend CXR Consider CT of chest when stable to further evaluate right perihilar density    New onset Afib with RVR P:  D/c cardizem given hypotension Getting calicum, mag bolus, and starting amio gtt with bolus/ infusion Hold AC at this time given bloody OGT   AKI Severe AGMA/ lactic acidosis  P:  Bicarb gtt  CT abd/ pelvis pending to rule out obstruction  Send urine lytes Continue foley, strict I/O  BMP q 12, check mag    Possible UGIB - initial Hgb reassuring  P:  protonix bolus and gtt Check stools/ hemoccult Trend H/H q 6    Mild Transaminitis - ? Shock vs ETOH hx   P:  Trend LFTs/ coags   Best practice:  Diet: NPO Pain/Anxiety/Delirium protocol (if indicated): prn only fentanyl/ versed  VAP protocol (if indicated): yes DVT prophylaxis: SCDs only for now till UGIB evaluated  GI prophylaxis: PPI  Glucose control: q 1hr or prn Mobility: BR Code Status: Full  Family Communication: Irving Burton [phone] Disposition: ICU  Labs   CBC: Recent  Labs  Lab 08/28/2019 1543 08/30/2019 1554 09/06/2019 1623  WBC 40.9*  --   --   NEUTROABS 37.6*  --   --   HGB 13.8 15.3 15.0  HCT 46.2 45.0 44.0  MCV 109.7*  --   --   PLT 476*  --   --     Basic Metabolic Panel: Recent Labs  Lab 08/31/2019 1543 08/20/2019 1554 08/20/2019 1623  NA 136 132* 131*  K 5.6* 5.1 5.3*  CL 99 112*  --   CO2 8*  --   --   GLUCOSE 57* 49*  --   BUN 34* 37*  --   CREATININE 4.69* 4.60*  --   CALCIUM 7.9*  --   --    GFR: Estimated Creatinine Clearance: 13.7 mL/min (A) (by C-G formula based on SCr of 4.6 mg/dL (H)).  Recent Labs  Lab 08/18/2019 1543 08/30/2019 1544  WBC 40.9*  --   LATICACIDVEN  --  >11.0*    Liver Function Tests: Recent Labs  Lab 09/04/2019 1543  AST 395*  ALT 103*  ALKPHOS 80  BILITOT 1.1  PROT 7.2  ALBUMIN 2.2*   Recent Labs  Lab 08/26/2019 1543  LIPASE 19   No results for input(s): AMMONIA in the last 168 hours.  ABG    Component Value Date/Time   PHART 6.985 (LL) 09/07/2019 1623   PCO2ART 42.0 08/15/2019 1623   PO2ART 296.0 (H) 08/25/2019 1623   HCO3 10.3 (L) 09/08/2019 1623   TCO2 12 (L) 08/23/2019 1623   ACIDBASEDEF 22.0 (H) 09/06/2019 1623   O2SAT 100.0 08/27/2019 1623     Coagulation Profile: Recent Labs  Lab 08/29/2019 1543  INR 2.0*    Cardiac Enzymes: No results for input(s): CKTOTAL, CKMB, CKMBINDEX, TROPONINI in the last 168 hours.  HbA1C: No results found for: HGBA1C  CBG: Recent Labs  Lab 09/11/2019 1547 09/07/2019 1549 09/08/2019 1600 09/01/2019 1632  GLUCAP 21* 29* 79 111*    Review of Systems:   Unable   Past Medical History  Unknown   Surgical History   unknown  Social History  Non smoker Unknown recreational drug use  Yes ETOH- wine  Family History   His family history is not on file.   Allergies No Known Allergies   Home Medications  Unknown   Critical care time: 63 mins       Kennieth Rad, MSN, AGACNP-BC [facility] 08/18/2019, 7:21  PM

## 2019-08-16 NOTE — Progress Notes (Signed)
eLink Physician-Brief Progress Note Patient Name: Philip Wells DOB: 12-21-1955 MRN: [id number]   Date of Service  Aug 19, 2019  HPI/Events of Note  Notified by Radiology of large L hydropneumothorax seen on CT Scan of Abdomen/Pelvis.  eICU Interventions  Ground team notified of radiology findings.      Intervention Category Major Interventions: Other:  Lenell Antu 08/19/19, 8:49 PM

## 2019-08-16 NOTE — ED Notes (Signed)
All CT rooms full at this time. CT stated pt is next

## 2019-08-16 NOTE — Progress Notes (Addendum)
RT Note:  Airway added to ventilator flowsheet. Pt intubated in ED prior to arrival to unit. 7.5 @ 23 cm at lips. Vent settings changed to reflect 8cc per CCM. 600-24-60%-5. Repeat ABG

## 2019-08-16 NOTE — Progress Notes (Signed)
  Amiodarone Drug - Drug Interaction Consult Note  Recommendations: Monitor for bradycardia while patient is receiving both amiodarone and diltiazem.   Amiodarone is metabolized by the cytochrome P450 system and therefore has the potential to cause many drug interactions. Amiodarone has an average plasma half-life of 50 days (range 20 to 100 days).   There is potential for drug interactions to occur several weeks or months after stopping treatment and the onset of drug interactions may be slow after initiating amiodarone.   []  Statins: Increased risk of myopathy. Simvastatin- restrict dose to 20mg  daily. Other statins: counsel patients to report any muscle pain or weakness immediately.  []  Anticoagulants: Amiodarone can increase anticoagulant effect. Consider warfarin dose reduction. Patients should be monitored closely and the dose of anticoagulant altered accordingly, remembering that amiodarone levels take several weeks to stabilize.  []  Antiepileptics: Amiodarone can increase plasma concentration of phenytoin, the dose should be reduced. Note that small changes in phenytoin dose can result in large changes in levels. Monitor patient and counsel on signs of toxicity.  []  Beta blockers: increased risk of bradycardia, AV block and myocardial depression. Sotalol - avoid concomitant use.  [x]   Calcium channel blockers (diltiazem and verapamil): increased risk of bradycardia, AV block and myocardial depression.  []   Cyclosporine: Amiodarone increases levels of cyclosporine. Reduced dose of cyclosporine is recommended.  []  Digoxin dose should be halved when amiodarone is started.  []  Diuretics: increased risk of cardiotoxicity if hypokalemia occurs.  []  Oral hypoglycemic agents (glyburide, glipizide, glimepiride): increased risk of hypoglycemia. Patient's glucose levels should be monitored closely when initiating amiodarone therapy.   []  Drugs that prolong the QT interval:  Torsades de  pointes risk may be increased with concurrent use - avoid if possible.  Monitor QTc, also keep magnesium/potassium WNL if concurrent therapy can't be avoided. Antibiotics: e.g. fluoroquinolones, erythromycin. . Antiarrhythmics: e.g. quinidine, procainamide, disopyramide, sotalol. . Antipsychotics: e.g. phenothiazines, haloperidol.  . Lithium, tricyclic antidepressants, and methadone. Thank You,   09/01/2019 6:46 PM

## 2019-08-16 NOTE — Consult Note (Addendum)
Cardiology Consultation:   Patient ID: Jeovany Huitron MRN: [id number]; DOB: 07/01/1957  Admit date: 08/15/2019 Date of Consult: 09/10/2019  Primary Care Provider: Patient, No Pcp Per Primary Cardiologist: New Primary Electrophysiologist:  None    Patient Profile:   Bayron Dalto is a 64 y.o. male with a hx of diabetes, seizures, possible prior drug use who is being seen today for the evaluation of cardiac arrest and possible STEMI at the request of Dr. Judd Lien.  History of Present Illness:   History was obtained through chart review given that patient was intubated and unresponsive. It appears he has not seen cardiology in the past. Chart review shows h/o of DM on metformin. EMS reported possible h/o of drug use and more recent history of possible acute illness.   Mr. Donaway was brought to the ED via EMS 09/04/2019 for possible STEMI and unwitnessed cardiac arrest. Initial EKG showed ?Aflutter, 142 bpm with STE in V3 and V4. EMS reported that he was at home and apparently there were other people at the house. He was found down in a room by a child. CPR was initiated. 6 rounds of CPR were done. EMS was called. No shocks given and no medications administered. He was intubated and brought to the ED. On the way to the ED rhythm noted to be in sinus tachycardia.  In the ED BP 114/72, pulse 140, RR 24. Rates slowly improved. The second EKG obtained showed sinus tachycardia, STE elevation had resolved and CODE STEMI was canceled. Third EKG showed sinus tach with PACs.   Heart Pathway Score:     Past Medical History:  Diagnosis Date  . Diabetes mellitus without complication (HCC)     No past surgical history on file.   Home Medications:  Prior to Admission medications   Medication Sig Start Date End Date Taking? Authorizing Provider  cyclobenzaprine (FLEXERIL) 10 MG tablet Take 1 tablet (10 mg total) by mouth at bedtime as needed for muscle spasms. 04/18/17   Cristina Gong, PA-C  gabapentin  (NEURONTIN) 100 MG capsule Take 2 capsules (200 mg total) by mouth at bedtime. 05/11/17   Judi Saa, DO  ibuprofen (ADVIL,MOTRIN) 200 MG tablet Take 200-600 mg by mouth every 6 (six) hours as needed (for headaches or pain).    [provider]  meloxicam (MOBIC) 15 MG tablet Take 1 tablet (15 mg total) by mouth daily. 04/27/17   Judi Saa, DO  METFORMIN HCL PO Take 1 tablet by mouth 3 (three) times daily.    [provider]  venlafaxine XR (EFFEXOR-XR) 75 MG 24 hr capsule Take 1 capsule (75 mg total) daily with breakfast by mouth. 06/01/17   Judi Saa, DO  Vitamin D, Ergocalciferol, (DRISDOL) 50000 units CAPS capsule Take 1 capsule (50,000 Units total) by mouth every 7 (seven) days. 04/20/17   Judi Saa, DO    Inpatient Medications: Scheduled Meds:  Continuous Infusions: . propofol     PRN Meds: etomidate, succinylcholine  Allergies:   No Known Allergies  Social History:   Social History   Socioeconomic History  . Marital status: Married    Spouse name: Not on file  . Number of children: Not on file  . Years of education: Not on file  . Highest education level: Not on file  Occupational History  . Not on file  Tobacco Use  . Smoking status: Never Smoker  . Smokeless tobacco: Never Used  Substance and Sexual Activity  . Alcohol use: Yes  .  Drug use: No  . Sexual activity: Not on file  Other Topics Concern  . Not on file  Social History Narrative  . Not on file   Social Determinants of Health   Financial Resource Strain:   . Difficulty of Paying Living Expenses: Not on file  Food Insecurity:   . Worried About Charity fundraiser in the Last Year: Not on file  . Ran Out of Food in the Last Year: Not on file  Transportation Needs:   . Lack of Transportation (Medical): Not on file  . Lack of Transportation (Non-Medical): Not on file  Physical Activity:   . Days of Exercise per Week: Not on file  . Minutes of Exercise per  Session: Not on file  Stress:   . Feeling of Stress : Not on file  Social Connections:   . Frequency of Communication with Friends and Family: Not on file  . Frequency of Social Gatherings with Friends and Family: Not on file  . Attends Religious Services: Not on file  . Active Member of Clubs or Organizations: Not on file  . Attends Archivist Meetings: Not on file  . Marital Status: Not on file  Intimate Partner Violence:   . Fear of Current or Ex-Partner: Not on file  . Emotionally Abused: Not on file  . Physically Abused: Not on file  . Sexually Abused: Not on file    Family History:   *No family history on file.   ROS:  Please see the history of present illness.  All other ROS reviewed and negative.     Physical Exam/Data:   Vitals:   09/01/2019 1533 09/05/2019 1543  BP: 114/72   Resp: (!) 24   Height:  5\' 6"  (1.676 m)   No intake or output data in the 24 hours ending 08/22/2019 1548 Last 3 Weights 06/01/2017 05/11/2017 04/27/2017  Weight (lbs) 197 lb 209 lb 191 lb  Weight (kg) 89.359 kg 94.802 kg 86.637 kg     Body mass index is 31.8 kg/m.  General:  Thin AAM intubated and nonresponsive HEENT: normal Lymph: no adenopathy Neck: no JVD Endocrine:  No thryomegaly Vascular: No carotid bruits; FA pulses 2+ bilaterally without bruits  Cardiac:  normal S1, S2; RRR; no murmur  Lungs:  clear to auscultation bilaterally, no wheezing, rhonchi or rales  Abd: soft, nontender, no hepatomegaly  Ext: no edema Musculoskeletal:  No deformities, BUE and BLE strength normal and equal Skin: warm and dry  Neuro:  CNs 2-12 intact, no focal abnormalities noted Psych:  Normal affect   EKG:  The EKG was personally reviewed and demonstrates:  Initial EKG with ?aflutter STE V3 and V4 Third EKG showed sinus tachycardia with PAcs Telemetry:  Telemetry was personally reviewed and demonstrates:  Sinus tachycardia with PACs  Relevant CV Studies:  Will order an echo  Laboratory  Data:  High Sensitivity Troponin:  No results for input(s): TROPONINIHS in the last 720 hours.   ChemistryNo results for input(s): NA, K, CL, CO2, GLUCOSE, BUN, CREATININE, CALCIUM, GFRNONAA, GFRAA, ANIONGAP in the last 168 hours.  No results for input(s): PROT, ALBUMIN, AST, ALT, ALKPHOS, BILITOT in the last 168 hours. HematologyNo results for input(s): WBC, RBC, HGB, HCT, MCV, MCH, MCHC, RDW, PLT in the last 168 hours. BNPNo results for input(s): BNP, PROBNP in the last 168 hours.  DDimer No results for input(s): DDIMER in the last 168 hours.   Radiology/Studies:  No results found. {  Assessment and Plan:  S/P cardiac arrest Patient had unwitnessed cardiac arrest. ROSC achieved after 6 rounds of CPR. No shockable rhythms noted and no meds given. Initial EKG showed possible alfutter with STE in V3 and V4 and Code STEMI was activated. However, on arrival rates slowed and subsequent EKGs showed sinus tachycardia with PACs, no STE. CODE STEMI was canceled. IV dilt ordered for tachycardia.  - Unsure etiology of cardiac arrest - istat labs creatinine 4.6 and glucose 29 - check echo - check Troponins  - Labs and CXR pending - will follow along - Further work-up per ED/CCM  For questions or updates, please contact [facility] Please consult [email or web address] for contact info under     Signed, Yerick Eggebrecht David Stall, PA-C  08/21/2019 3:48 PM

## 2019-08-16 NOTE — Procedures (Signed)
Chest Tube Insertion Procedure Note  Indications:  Clinically significant hydropneumothorax.  Pre-operative Diagnosis: hydropneumothorax.  Post-operative Diagnosis: hydropneumothorax.  Procedure Details  Informed consent was obtained for the procedure, including sedation.  Risks of lung perforation, hemorrhage, arrhythmia, and adverse drug reaction were discussed.   After sterile skin prep, using standard technique, a 14 French tube was placed in the left lateral 4th rib space.  Findings: Gush of air and yellow fluid.  Estimated Blood Loss:  Minimal         Specimens:  None              Complications:  None; patient tolerated the procedure well.         Disposition: ICU - intubated and critically ill.         Condition: stable.  Rutherford Guys, [place] [facility] 09/06/2019, 9:57 PM

## 2019-08-16 NOTE — H&P (Signed)
NAME:  Philip Wells, MRN:  [id number], DOB:  12/03/1955, LOS: 0 ADMISSION DATE:  08/24/2019, CONSULTATION DATE:  August 24, 2019 REFERRING MD:  ED - [facility], CHIEF COMPLAINT:  Status post cardiac arrest.   Brief History   33 yoM presenting post cardiac arrest.  Little known but apparently found down, unknown down time, ROSC after 6 rounds of CPR, no shock advised.  Initial temp 33 celsius.  Since has remained unresponsive and progressively hypotensive complicated by afib with RVR, severe metabolic acidosis, and multiple metabolic derangements.    History of present illness   HPI obtained from sister at bedside, Karlyne Greenspan [phone].  Patient's initial registration was different [employer].  Sister verifies patient's DOB of 08-28-1955. MRN [id number] is not the correct patient.   Per sister, patient is 44 year AA male with no known past medical history. NKDA, no known medications, non smoker, unknown drug history, and does drink wine. Patient lives with his girlfriend, Carlye Grippe in [place].  Story is unclear, but he was found unresponsive, unclear time down.  CPR performed by Fire department, reportedly 6 rounds of CPR with non-shockable rhythm prior to ROSC with [facility] airway placed.    In ER, patient remained unresponsive, initial temp 33 degree celsius, initially normotensive, hypoglycemia, and in atrial tachycardia.  King airway replaced by ETT in ER.    Initially in coded by EMS as possible STEMI, however on cardiology eval in ER, initial rhythm showing aflutter with ratge 140s and STE in V3-4 which had resolved on arrival to ER.  Placed on cardizem gtt for rate control.    Workup noted for WBC 41K, Hgb 13.8, platelets 476, K 5.6, CO2 8, BUN 34, sCr 4.69, corrected Ca 9.3, glucose 57, AST 395, ALT 103, BNP 1142, trop hs 40, lactic  >11, INR 2, neg ETOH, neg tylenol/ salicylate level, SARS2/ flu neg, CXR with good placement of ETT/ OGT, and nonspecific right perihilar density in which   CT was recommended for followup.  CTH pending.  Patient treated D50, finishing 3L NS, and multiple bicarb pushes.  PCCM asked to admit.   Past Medical History  ETOH use- wine  Significant Hospital Events   N/A  Consults:  Cardiology - Patient R/O for STEMI  Procedures:  2/2 ETT >> 2/2 foley >> 2/2 R [place] CVL >>  Significant Diagnostic Tests:  2/2 CXR >> 1. Right perihilar density, differential include scarring versus parenchymal lung mass. CT chest with IV contrast may be useful for further evaluation when clinical situation permits. 2. No complication after intubation. 3. Otherwise no acute process.  2/2 CTH >>  Micro Data:  2/2 SARS 2 / Flu A/b >> neg 2/2 BCx 2 >> 2/2 UC >> 2/2 trach asp >>  Antimicrobials:  2/2 vanc >> 2/2 cefepime >>  Interim history/subjective:  No neurological improvement off propofol.  Objective   Blood pressure 112/78, pulse (!) 118, temperature (!) 93.2 F (34 C), temperature source Bladder, resp. rate (!) 31, height 5\' 11"  (1.803 m), weight 57.7 kg, SpO2 94 %.    FiO2 (%):  [60 %] 60 %   Intake/Output Summary (Last 24 hours) at 2019-08-24 2049 Last data filed at 08-24-19 2000 Gross per 24 hour  Intake --  Output 25 ml  Net -25 ml   Filed Weights   24-Aug-2019 2000  Weight: 57.7 kg    Examination: General:  Thin older male unresponsive on MV HEENT: MM pink/ dry, pupils 3/ reactive, absent corneals, ETT, OGT -  with frank bloody secretions ~250 ml thus far Neuro:  Unresponsive, minimal reflex/ posturing in LE with noxious stimuli CV: irir, no murmur  PULM:  Breathing over MV rate, diffuse rhonchi, scattered wheeze GI: soft, bs hypoactive  Extremities: cool /dry, no edema  Skin: no rashes   I performed a bedside limited echocardiogram. Limited accoustic windows. Tachycardia made assessment of LV function difficult. LV/RV size appear normal. No regional WMA, LV function appears low normal. RV function normal. No valvular  abnormalities. IVC 1.9 with 15% respiratory variation.  Resolved Hospital Problem list   Cardiac arrest   Assessment & Plan:  Cardiac Arrest - unclear etiology. Doesn't appear to be primary cardiac or PE given relatively normal TTE. Given marked leukocytosis, septic shock strong possibility. No spontaneous neurological recovery to this point and downtime unknown. -TTM to 36 -EEG -Neuroprognositication at 48h.  Critically ill due to undifferentiated shock  -Titrate phenylephrine to keep MAP>65 - CVP monitoring to guide fluid therapy - Assess cortisol   Acute kidney injury Check CK to rule out rhabdomyolysis Follow Creatinine  Acute encephalopathy  Hx of ETOH use, wine - rule out toxic/ metabolic, concerning for anoxic injury as well as patient remains off sedation thus far without neurological improvement  -EEG -Thiamine -Follow up on ventriculomegaly on CT.   Leukocytosis concerning for septic shock  - unclear etiology at this point P:  Pan culture UA pending  Empiric vanc / cefepime CT abd/ pelvis to look for possible infection Trend WBC./ fever curve   Critically ill due to hypoxic respiratory failure Right perihilar density -Full MV support, PRVC 8 cc/kg, rate 28 -Wean FiO2 for sat goal > 90%  Critically ill due New onset Afib with RVR P:  D/c cardizem given hypotension Getting calicum, mag bolus, and starting amio gtt with bolus/ infusion Hold AC at this time given bloody OGT   AKI Severe AGMA/ lactic acidosis  P:  Bicarb gtt  CT abd/ pelvis pending to rule out obstruction  Send urine lytes Continue foley, strict I/O  BMP q 12, check mag    Possible UGIB - initial Hgb reassuring  P:  protonix bolus and gtt Check stools/ hemoccult Trend H/H q 6    Mild Transaminitis - ? Shock vs ETOH hx   P:  Trend LFTs/ coags   Daily Goals Checklist  Pain/Anxiety/Delirium protocol (if indicated): no sedation  VAP protocol (if indicated): bundle  in place  Respiratory support goals: full support Blood pressure target: PE to keep MAP>65 DVT prophylaxis: SCD's only Nutrition Status: high nutritional risk, initiate feeds in am GI prophylaxis: PPI infusion Fluid status goals: CVP targeted fluid resuscitation Urinary catheter: Assessment of intravascular volume Central line: left subclavian TLC Glucose control: monitor for hypoglycemia, consider stress steroids Mobility/therapy needs: bed rest Antibiotic de-escalation: continue empiric pending cultures. Home medication reconciliation: none relevant. Daily labs: CBC, CMP Code Status: Full Family Communication: Spoke to sister and have explained seriousness of his condition. Explained risk of poor neurological recovery. Disposition: ICU   Labs   CBC: No results for input(s): WBC, NEUTROABS, HGB, HCT, MCV, PLT in the last 168 hours.  Basic Metabolic Panel: No results for input(s): NA, K, CL, CO2, GLUCOSE, BUN, CREATININE, CALCIUM, MG, PHOS in the last 168 hours. GFR: CrCl cannot be calculated (No successful lab value found.). No results for input(s): PROCALCITON, WBC, LATICACIDVEN in the last 168 hours.  Liver Function Tests: No results for input(s): AST, ALT, ALKPHOS, BILITOT, PROT, ALBUMIN in the last 168 hours.  No results for input(s): LIPASE, AMYLASE in the last 168 hours. No results for input(s): AMMONIA in the last 168 hours.  ABG No results found for: PHART, PCO2ART, PO2ART, HCO3, TCO2, ACIDBASEDEF, O2SAT   Coagulation Profile: No results for input(s): INR, PROTIME in the last 168 hours.  Cardiac Enzymes: No results for input(s): CKTOTAL, CKMB, CKMBINDEX, TROPONINI in the last 168 hours.  HbA1C: No results found for: HGBA1C  CBG: No results for input(s): GLUCAP in the last 168 hours.  Review of Systems:   Unable to perform  Past Medical History  He,  has no past medical history on file.   Surgical History   None    Social History      Family  History   His family history is not on file.   Allergies No Known Allergies   Home Medications  Prior to Admission medications   Not on File    CRITICAL CARE Performed by: Kipp Brood   Total critical care time: 45 minutes  Critical care time was exclusive of separately billable procedures and treating other patients.  Critical care was necessary to treat or prevent imminent or life-threatening deterioration.  Critical care was time spent personally by me on the following activities: development of treatment plan with patient and/or surrogate as well as nursing, discussions with consultants, evaluation of patient's response to treatment, examination of patient, obtaining history from patient or surrogate, ordering and performing treatments and interventions, ordering and review of laboratory studies, ordering and review of radiographic studies, pulse oximetry, re-evaluation of patient's condition and participation in multidisciplinary rounds.  Kipp Brood, MD [facility] ICU Physician [place]  Pager: [phone] Mobile: [phone] After hours: [phone].

## 2019-08-16 NOTE — ED Provider Notes (Signed)
[facility] EMERGENCY DEPARTMENT Provider Note   CSN: [id number] Arrival date & time: 08/22/19  1527     History Chief Complaint  Patient presents with  . post CPR    Dalonte Hardage is a 64 y.o. male.  Patient is a 64 year old male with past medical history of diabetes.  He is brought by EMS for evaluation of altered mental status.  According to EMS, the patient was with his family.  His wife left the room, then returned and found him not responding.  911 was called and the first on scene was the fire department.  Apparently they performed several rounds of CPR.  When paramedics arrived, no shock was advised on the AED and the patient had apparently achieved ROSC.  A King airway was placed and the patient transported here.  Patient has no additional history secondary to acuity of condition.  The history is provided by the patient.       Past Medical History:  Diagnosis Date  . Diabetes mellitus without complication [facility])     Patient Active Problem List   Diagnosis Date Noted  . Mild concussion 04/20/2017    No past surgical history on file.     No family history on file.  Social History   Tobacco Use  . Smoking status: Never Smoker  . Smokeless tobacco: Never Used  Substance Use Topics  . Alcohol use: Yes  . Drug use: No    Home Medications Prior to Admission medications   Medication Sig Start Date End Date Taking? Authorizing Provider  cyclobenzaprine (FLEXERIL) 10 MG tablet Take 1 tablet (10 mg total) by mouth at bedtime as needed for muscle spasms. 04/18/17   Cristina Gong, PA-C  gabapentin (NEURONTIN) 100 MG capsule Take 2 capsules (200 mg total) by mouth at bedtime. 05/11/17   Judi Saa, DO  ibuprofen (ADVIL,MOTRIN) 200 MG tablet Take 200-600 mg by mouth every 6 (six) hours as needed (for headaches or pain).    [provider]  meloxicam (MOBIC) 15 MG tablet Take 1 tablet (15 mg total) by mouth daily. 04/27/17   Judi Saa, DO  metFORMIN (GLUCOPHAGE) 850 MG tablet Take 850 mg by mouth 3 (three) times daily. 07/07/19   [provider]  METFORMIN HCL PO Take 1 tablet by mouth 3 (three) times daily.    [provider]  venlafaxine XR (EFFEXOR-XR) 75 MG 24 hr capsule Take 1 capsule (75 mg total) daily with breakfast by mouth. 06/01/17   Judi Saa, DO  Vitamin D, Ergocalciferol, (DRISDOL) 50000 units CAPS capsule Take 1 capsule (50,000 Units total) by mouth every 7 (seven) days. 04/20/17   Judi Saa, DO    Allergies    Patient has no known allergies.  Review of Systems   Review of Systems  Unable to perform ROS: Acuity of condition    Physical Exam Updated Vital Signs BP 140/83   Pulse (!) 133   Temp (!) 95 F (35 C)   Resp (!) 21   Ht 5\' 6"  (1.676 m)   Wt 59 kg   SpO2 96%   BMI 20.98 kg/m   Physical Exam Vitals and nursing note reviewed.  Constitutional:      General: He is not in acute distress.    Appearance: He is well-developed. He is not diaphoretic.  HENT:     Head: Normocephalic and atraumatic.  Cardiovascular:     Rate and Rhythm: Normal rate and regular rhythm.  Heart sounds: No murmur. No friction rub.  Pulmonary:     Comments: Patient being ventilated with a King airway upon arrival.  Breath sounds are coarse bilaterally. Abdominal:     General: Bowel sounds are normal. There is no distension.     Palpations: Abdomen is soft.     Tenderness: There is no abdominal tenderness.  Musculoskeletal:        General: Normal range of motion.     Cervical back: Normal range of motion and neck supple.  Skin:    General: Skin is warm and dry.  Neurological:     Mental Status: He is alert.     Comments: Upon arrival, not making any spontaneous movement.  He is attempting to take spontaneous respirations.     ED Results / Procedures / Treatments   Labs (all labs ordered are listed, but only abnormal results are displayed) Labs Reviewed  CBC  WITH DIFFERENTIAL/PLATELET - Abnormal; Notable for the following components:      Result Value   WBC 40.9 (*)    RBC 4.21 (*)    MCV 109.7 (*)    MCHC 29.9 (*)    Platelets 476 (*)    All other components within normal limits  I-STAT CHEM 8, ED - Abnormal; Notable for the following components:   Sodium 132 (*)    Chloride 112 (*)    BUN 37 (*)    Creatinine, Ser 4.60 (*)    Glucose, Bld 49 (*)    Calcium, Ion 0.90 (*)    TCO2 11 (*)    All other components within normal limits  CBG MONITORING, ED - Abnormal; Notable for the following components:   Glucose-Capillary 21 (*)    All other components within normal limits  CBG MONITORING, ED - Abnormal; Notable for the following components:   Glucose-Capillary 29 (*)    All other components within normal limits  RESPIRATORY PANEL BY RT PCR (FLU A&B, COVID)  COMPREHENSIVE METABOLIC PANEL  ETHANOL  LIPASE, BLOOD  BRAIN NATRIURETIC PEPTIDE  SALICYLATE LEVEL  ACETAMINOPHEN LEVEL  PROTIME-INR  LACTIC ACID, PLASMA  LACTIC ACID, PLASMA  BLOOD GAS, ARTERIAL  CBG MONITORING, ED  TYPE AND SCREEN  TROPONIN I (HIGH SENSITIVITY)    EKG EKG Interpretation  Date/Time:  Tuesday August 16 2019 15:39:36 EST Ventricular Rate:  122 PR Interval:    QRS Duration: 76 QT Interval:  251 QTC Calculation: 347 R Axis:   82 Text Interpretation: Sinus tachycardia Atrial premature complexes Borderline right axis deviation Borderline T wave abnormalities ST elevation, consider inferior injury Confirmed by Veryl Speak [phone]) on 08/30/2019 3:46:13 PM   Radiology No results found.  Procedures Procedures (including critical care time)  Medications Ordered in ED Medications  etomidate (AMIDATE) injection (20 mg Intravenous Given 08/20/2019 1534)  succinylcholine (ANECTINE) injection (150 mg Intravenous Given 08/20/2019 1535)  dextrose 50 % solution (1 ampule Intravenous Given 08/20/2019 1550)  propofol (DIPRIVAN) 1000 MG/100ML infusion (5 mcg/kg/min  59  kg Intravenous New Bag/Given 08/26/2019 1605)  diltiazem (CARDIZEM) 125 mg in dextrose 5% 125 mL (1 mg/mL) infusion (has no administration in time range)    ED Course  I have reviewed the triage vital signs and the nursing notes.  Pertinent labs & imaging results that were available during my care of the patient were reviewed by me and considered in my medical decision making (see chart for details).    MDM Rules/Calculators/A&P  Patient brought by EMS for evaluation of altered mental status.  Patient with his significant other when he became acutely unresponsive.  There was CPR performed by the fire department.  When EMS arrived, patient had achieved ROSC and a King airway was placed.  The patient was transported here as a possible code STEMI due to potential EKG changes.  Patient arrived here unresponsive being ventilated with a King airway.  He was in atrial fibrillation with adequate blood pressure.  Using etomidate and succinylcholine for RSI, the [facility] airway was replaced with a 7.5 endotracheal tube.  The cords were easily visualized using a #3 glide scope blade.  Tube placement confirmed with direct visualization, end-tidal CO2, and auscultation over the heart and lungs.  Chest x-ray shows appropriate positioning.  Patient's work-up reveals multiple abnormalities including white cell count of 41,000, lactate greater than 11,000, acute renal failure, evidence for shock liver.  His head CT was performed and was unremarkable.  I am uncertain as to the etiology of the altered mental status, however sepsis was considered.  Blood cultures were obtained and the patient given broad-spectrum antibiotics.  I have consulted critical care who will evaluate the patient and admit.  I updated the patient's nephew and sister on the patient's status.  CRITICAL CARE Performed by: Geoffery Lyons Total critical care time: 70 minutes Critical care time was exclusive of separately billable procedures and  treating other patients. Critical care was necessary to treat or prevent imminent or life-threatening deterioration. Critical care was time spent personally by me on the following activities: development of treatment plan with patient and/or surrogate as well as nursing, discussions with consultants, evaluation of patient's response to treatment, examination of patient, obtaining history from patient or surrogate, ordering and performing treatments and interventions, ordering and review of laboratory studies, ordering and review of radiographic studies, pulse oximetry and re-evaluation of patient's condition.   Final Clinical Impression(s) / ED Diagnoses Final diagnoses:  None    Rx / DC Orders ED Discharge Orders    None       Geoffery Lyons, MD 08/30/2019 2207

## 2019-08-16 NOTE — Progress Notes (Signed)
Multiple attempts to do EEG  Patient having other procedures.   Per RN do first in am tomorrow

## 2019-08-17 ENCOUNTER — Inpatient Hospital Stay (HOSPITAL_COMMUNITY): Payer: Self-pay

## 2019-08-17 DIAGNOSIS — J939 Pneumothorax, unspecified: Secondary | ICD-10-CM

## 2019-08-17 DIAGNOSIS — J9602 Acute respiratory failure with hypercapnia: Secondary | ICD-10-CM

## 2019-08-17 DIAGNOSIS — J930 Spontaneous tension pneumothorax: Secondary | ICD-10-CM

## 2019-08-17 DIAGNOSIS — I469 Cardiac arrest, cause unspecified: Secondary | ICD-10-CM

## 2019-08-17 DIAGNOSIS — J9601 Acute respiratory failure with hypoxia: Secondary | ICD-10-CM

## 2019-08-17 LAB — BASIC METABOLIC PANEL
Anion gap: 16 — ABNORMAL HIGH (ref 5–15)
BUN: 38 mg/dL — ABNORMAL HIGH (ref 8–23)
CO2: 18 mmol/L — ABNORMAL LOW (ref 22–32)
Calcium: 6.7 mg/dL — ABNORMAL LOW (ref 8.9–10.3)
Chloride: 105 mmol/L (ref 98–111)
Creatinine, Ser: 2.7 mg/dL — ABNORMAL HIGH (ref 0.61–1.24)
GFR calc Af Amer: 28 mL/min — ABNORMAL LOW (ref >60)
GFR calc non Af Amer: 24 mL/min — ABNORMAL LOW (ref >60)
Glucose, Bld: 128 mg/dL — ABNORMAL HIGH (ref 70–99)
Potassium: 4.1 mmol/L (ref 3.5–5.1)
Sodium: 139 mmol/L (ref 135–145)

## 2019-08-17 LAB — CBC WITH DIFFERENTIAL/PLATELET
Abs Immature Granulocytes: 0 10*3/uL (ref 0.00–0.07)
Basophils Absolute: 0 10*3/uL (ref 0.0–0.1)
Basophils Relative: 0 %
Eosinophils Absolute: 0 10*3/uL (ref 0.0–0.5)
Eosinophils Relative: 0 %
HCT: 41 % (ref 39.0–52.0)
Hemoglobin: 13.5 g/dL (ref 13.0–17.0)
Lymphocytes Relative: 1 %
Lymphs Abs: 0.2 10*3/uL — ABNORMAL LOW (ref 0.7–4.0)
MCH: 32.5 pg (ref 26.0–34.0)
MCHC: 32.9 g/dL (ref 30.0–36.0)
MCV: 98.8 fL (ref 80.0–100.0)
Monocytes Absolute: 0.5 10*3/uL (ref 0.1–1.0)
Monocytes Relative: 3 %
Neutro Abs: 15.7 10*3/uL — ABNORMAL HIGH (ref 1.7–7.7)
Neutrophils Relative %: 96 %
Platelets: 337 10*3/uL (ref 150–400)
RBC: 4.15 MIL/uL — ABNORMAL LOW (ref 4.22–5.81)
RDW: 13.2 % (ref 11.5–15.5)
WBC: 16.4 10*3/uL — ABNORMAL HIGH (ref 4.0–10.5)
nRBC: 1.3 % — ABNORMAL HIGH (ref 0.0–0.2)
nRBC: 3 /100 WBC — ABNORMAL HIGH

## 2019-08-17 LAB — PROTIME-INR
INR: 2.1 — ABNORMAL HIGH (ref 0.8–1.2)
Prothrombin Time: 23 seconds — ABNORMAL HIGH (ref 11.4–15.2)

## 2019-08-17 LAB — POCT I-STAT 7, (LYTES, BLD GAS, ICA,H+H)
Acid-base deficit: 6 mmol/L — ABNORMAL HIGH (ref 0.0–2.0)
Acid-base deficit: 12 mmol/L — ABNORMAL HIGH (ref 0.0–2.0)
Acid-base deficit: 5 mmol/L — ABNORMAL HIGH (ref 0.0–2.0)
Bicarbonate: 19.8 mmol/L — ABNORMAL LOW (ref 20.0–28.0)
Bicarbonate: 15.8 mmol/L — ABNORMAL LOW (ref 20.0–28.0)
Bicarbonate: 20.5 mmol/L (ref 20.0–28.0)
Calcium, Ion: 1.03 mmol/L — ABNORMAL LOW (ref 1.15–1.40)
Calcium, Ion: 0.95 mmol/L — ABNORMAL LOW (ref 1.15–1.40)
Calcium, Ion: 0.82 mmol/L — CL (ref 1.15–1.40)
HCT: 45 % (ref 39.0–52.0)
HCT: 43 % (ref 39.0–52.0)
HCT: 34 % — ABNORMAL LOW (ref 39.0–52.0)
Hemoglobin: 15.3 g/dL (ref 13.0–17.0)
Hemoglobin: 14.6 g/dL (ref 13.0–17.0)
Hemoglobin: 11.6 g/dL — ABNORMAL LOW (ref 13.0–17.0)
O2 Saturation: 94 %
O2 Saturation: 94 %
O2 Saturation: 95 %
Patient temperature: 36.2
Patient temperature: 36
Patient temperature: 36
Potassium: 4 mmol/L (ref 3.5–5.1)
Potassium: 4.5 mmol/L (ref 3.5–5.1)
Potassium: 4.1 mmol/L (ref 3.5–5.1)
Sodium: 139 mmol/L (ref 135–145)
Sodium: 139 mmol/L (ref 135–145)
Sodium: 145 mmol/L (ref 135–145)
TCO2: 21 mmol/L — ABNORMAL LOW (ref 22–32)
TCO2: 17 mmol/L — ABNORMAL LOW (ref 22–32)
TCO2: 22 mmol/L (ref 22–32)
pCO2 arterial: 38.3 mmHg (ref 32.0–48.0)
pCO2 arterial: 41 mmHg (ref 32.0–48.0)
pCO2 arterial: 38.8 mmHg (ref 32.0–48.0)
pH, Arterial: 7.318 — ABNORMAL LOW (ref 7.350–7.450)
pH, Arterial: 7.187 — CL (ref 7.350–7.450)
pH, Arterial: 7.326 — ABNORMAL LOW (ref 7.350–7.450)
pO2, Arterial: 72 mmHg — ABNORMAL LOW (ref 83.0–108.0)
pO2, Arterial: 84 mmHg (ref 83.0–108.0)
pO2, Arterial: 80 mmHg — ABNORMAL LOW (ref 83.0–108.0)

## 2019-08-17 LAB — PROCALCITONIN: Procalcitonin: 72.79 ng/mL

## 2019-08-17 LAB — BASIC METABOLIC PANEL WITH GFR
Anion gap: 17 — ABNORMAL HIGH (ref 5–15)
BUN: 37 mg/dL — ABNORMAL HIGH (ref 8–23)
CO2: 15 mmol/L — ABNORMAL LOW (ref 22–32)
Calcium: 7.2 mg/dL — ABNORMAL LOW (ref 8.9–10.3)
Chloride: 103 mmol/L (ref 98–111)
Creatinine, Ser: 3.38 mg/dL — ABNORMAL HIGH (ref 0.61–1.24)
GFR calc Af Amer: 21 mL/min — ABNORMAL LOW
GFR calc non Af Amer: 18 mL/min — ABNORMAL LOW
Glucose, Bld: 204 mg/dL — ABNORMAL HIGH (ref 70–99)
Potassium: 4.2 mmol/L (ref 3.5–5.1)
Sodium: 135 mmol/L (ref 135–145)

## 2019-08-17 LAB — ECHOCARDIOGRAM COMPLETE
Height: 71 in
Weight: 2197.9 oz

## 2019-08-17 LAB — CBC
HCT: 43.3 % (ref 39.0–52.0)
Hemoglobin: 14.3 g/dL (ref 13.0–17.0)
MCH: 32.4 pg (ref 26.0–34.0)
MCHC: 33 g/dL (ref 30.0–36.0)
MCV: 98.2 fL (ref 80.0–100.0)
Platelets: 416 K/uL — ABNORMAL HIGH (ref 150–400)
RBC: 4.41 MIL/uL (ref 4.22–5.81)
RDW: 13.2 % (ref 11.5–15.5)
WBC: 20.7 K/uL — ABNORMAL HIGH (ref 4.0–10.5)
nRBC: 0.2 % (ref 0.0–0.2)

## 2019-08-17 LAB — APTT: aPTT: 44 seconds — ABNORMAL HIGH (ref 24–36)

## 2019-08-17 LAB — TSH: TSH: 4.704 u[IU]/mL — ABNORMAL HIGH (ref 0.350–4.500)

## 2019-08-17 LAB — COOXEMETRY PANEL
Carboxyhemoglobin: 0.7 % (ref 0.5–1.5)
Methemoglobin: 1 % (ref 0.0–1.5)
O2 Saturation: 74.2 %
Total hemoglobin: 12.2 g/dL (ref 12.0–16.0)

## 2019-08-17 LAB — GLUCOSE, CAPILLARY
Glucose-Capillary: 152 mg/dL — ABNORMAL HIGH (ref 70–99)
Glucose-Capillary: 116 mg/dL — ABNORMAL HIGH (ref 70–99)
Glucose-Capillary: 61 mg/dL — ABNORMAL LOW (ref 70–99)
Glucose-Capillary: 221 mg/dL — ABNORMAL HIGH (ref 70–99)
Glucose-Capillary: 94 mg/dL (ref 70–99)
Glucose-Capillary: 90 mg/dL (ref 70–99)
Glucose-Capillary: 83 mg/dL (ref 70–99)

## 2019-08-17 LAB — LACTIC ACID, PLASMA: Lactic Acid, Venous: 6.5 mmol/L (ref 0.5–1.9)

## 2019-08-17 LAB — CORTISOL: Cortisol, Plasma: 34.3 ug/dL

## 2019-08-17 MED ORDER — DEXTROSE 50 % IV SOLN
INTRAVENOUS | Status: AC
  Administered 2019-08-17: 12:00:00 50 mL
  Filled 2019-08-17: qty 50

## 2019-08-17 MED ORDER — PHENYLEPHRINE HCL-NACL 10-0.9 MG/250ML-% IV SOLN
0.0000 ug/min | INTRAVENOUS | Status: DC
  Administered 2019-08-17: 400 ug/min via INTRAVENOUS
  Administered 2019-08-17: 20:00:00 20 ug/min via INTRAVENOUS
  Filled 2019-08-17: qty 250
  Filled 2019-08-17 (×2): qty 500

## 2019-08-17 MED ORDER — SODIUM BICARBONATE 8.4 % IV SOLN
INTRAVENOUS | Status: DC
  Filled 2019-08-17 (×4): qty 150

## 2019-08-17 MED ORDER — SODIUM BICARBONATE 8.4 % IV SOLN
50.0000 meq | Freq: Once | INTRAVENOUS | Status: AC
  Administered 2019-08-17: 50 meq via INTRAVENOUS
  Filled 2019-08-17: qty 50

## 2019-08-17 MED ORDER — CALCIUM GLUCONATE-NACL 1-0.675 GM/50ML-% IV SOLN
INTRAVENOUS | Status: AC
  Administered 2019-08-17: 22:00:00 1000 mg via INTRAVENOUS
  Filled 2019-08-17: qty 100

## 2019-08-17 MED ORDER — SODIUM CHLORIDE 0.9 % IV BOLUS
500.0000 mL | Freq: Once | INTRAVENOUS | Status: AC
  Administered 2019-08-17: 500 mL via INTRAVENOUS

## 2019-08-17 MED ORDER — SODIUM CHLORIDE 0.9% FLUSH
10.0000 mL | INTRAVENOUS | Status: DC | PRN
  Administered 2019-08-22: 10 mL

## 2019-08-17 MED ORDER — SODIUM CHLORIDE 0.9% FLUSH
10.0000 mL | Freq: Two times a day (BID) | INTRAVENOUS | Status: DC
  Administered 2019-08-17 – 2019-08-26 (×14): 10 mL
  Administered 2019-08-26 – 2019-08-27 (×2): 30 mL
  Administered 2019-08-28 – 2019-08-30 (×5): 10 mL
  Administered 2019-08-30: 20 mL
  Administered 2019-08-31: 09:00:00 10 mL
  Administered 2019-09-01: 20 mL
  Administered 2019-09-01: 09:00:00 10 mL
  Administered 2019-09-02: 09:00:00 30 mL

## 2019-08-17 MED ORDER — PHENYLEPHRINE CONCENTRATED 100MG/250ML (0.4 MG/ML) INFUSION SIMPLE
0.0000 ug/min | INTRAVENOUS | Status: DC
  Administered 2019-08-17: 20 ug/min via INTRAVENOUS
  Administered 2019-08-18: 400 ug/min via INTRAVENOUS
  Administered 2019-08-18: 330 ug/min via INTRAVENOUS
  Filled 2019-08-17 (×9): qty 250

## 2019-08-17 MED ORDER — SODIUM CHLORIDE 0.9 % IV BOLUS
1000.0000 mL | Freq: Once | INTRAVENOUS | Status: AC
  Administered 2019-08-17: 05:00:00 1000 mL via INTRAVENOUS

## 2019-08-17 MED ORDER — HYDROCORTISONE NA SUCCINATE PF 100 MG IJ SOLR
100.0000 mg | Freq: Two times a day (BID) | INTRAMUSCULAR | Status: DC
  Administered 2019-08-17: 23:00:00 100 mg via INTRAVENOUS
  Filled 2019-08-17: qty 2

## 2019-08-17 MED ORDER — EPINEPHRINE 1 MG/10ML IJ SOSY
PREFILLED_SYRINGE | INTRAMUSCULAR | Status: AC
  Filled 2019-08-17: qty 10

## 2019-08-17 MED ORDER — SODIUM BICARBONATE 8.4 % IV SOLN
100.0000 meq | Freq: Once | INTRAVENOUS | Status: AC
  Administered 2019-08-17: 100 meq via INTRAVENOUS

## 2019-08-17 MED ORDER — PHENYLEPHRINE HCL-NACL 10-0.9 MG/250ML-% IV SOLN
INTRAVENOUS | Status: AC
  Filled 2019-08-17: qty 500

## 2019-08-17 MED ORDER — ORAL CARE MOUTH RINSE
15.0000 mL | OROMUCOSAL | Status: DC
  Administered 2019-08-17 – 2019-08-22 (×57): 15 mL via OROMUCOSAL

## 2019-08-17 MED ORDER — CALCIUM GLUCONATE-NACL 2-0.675 GM/100ML-% IV SOLN
2.0000 g | Freq: Once | INTRAVENOUS | Status: AC
  Administered 2019-08-17: 22:00:00 2000 mg via INTRAVENOUS
  Filled 2019-08-17: qty 100

## 2019-08-17 MED ORDER — SODIUM BICARBONATE 8.4 % IV SOLN
INTRAVENOUS | Status: AC
  Filled 2019-08-17: qty 100

## 2019-08-17 MED ORDER — SODIUM BICARBONATE 8.4 % IV SOLN
INTRAVENOUS | Status: DC
  Filled 2019-08-17: qty 100

## 2019-08-17 MED ORDER — CHLORHEXIDINE GLUCONATE 0.12% ORAL RINSE (MEDLINE KIT)
15.0000 mL | Freq: Two times a day (BID) | OROMUCOSAL | Status: DC
  Administered 2019-08-17 – 2019-08-22 (×12): 15 mL via OROMUCOSAL

## 2019-08-17 MED ORDER — VASOPRESSIN 20 UNIT/ML IV SOLN
0.0300 [IU]/min | INTRAVENOUS | Status: DC
  Administered 2019-08-17 – 2019-08-19 (×3): 0.03 [IU]/min via INTRAVENOUS
  Filled 2019-08-17 (×5): qty 2

## 2019-08-17 MED ORDER — CHLORHEXIDINE GLUCONATE CLOTH 2 % EX PADS
6.0000 | MEDICATED_PAD | Freq: Every day | CUTANEOUS | Status: DC
  Administered 2019-08-17 – 2019-09-02 (×17): 6 via TOPICAL

## 2019-08-17 MED ORDER — SODIUM CHLORIDE 0.9 % IV BOLUS
1000.0000 mL | Freq: Once | INTRAVENOUS | Status: AC
  Administered 2019-08-17: 1000 mL via INTRAVENOUS

## 2019-08-17 NOTE — Progress Notes (Signed)
EEG complete - results pending 

## 2019-08-17 NOTE — Progress Notes (Signed)
eLink Physician-Brief Progress Note Patient Name: Philip Wells DOB: 01-22-1956 MRN: [id number]   Date of Service  08/17/2019  HPI/Events of Note  Oliguria - CVP = 6-8.   eICU Interventions  Will order: 1. Bolus with 0.9 NaCl 1 liter IV over 1 hour now.      Intervention Category Major Interventions: Other:  Philip Wells Dennard Nip 08/17/2019, 4:23 AM

## 2019-08-17 NOTE — Procedures (Signed)
Arterial Catheter Insertion Procedure Note Philip Wells [facility] [id number] [birth year]  Procedure: Insertion of Arterial Catheter  Indications: Blood pressure monitoring and Frequent blood sampling  Procedure Details Consent: Unable to obtain consent because of emergent medical necessity. Time Out: Verified patient identification, verified procedure, site/side was marked, verified correct patient position, special equipment/implants available, medications/allergies/relevent history reviewed, required imaging and test results available.  Performed  Maximum sterile technique was used including antiseptics, cap, gloves, hand hygiene, mask and sheet. Skin prep: Chlorhexidine; local anesthetic administered 20 gauge catheter was inserted into right radial artery using the Seldinger technique. ULTRASOUND GUIDANCE USED: YES Evaluation Blood flow good; BP tracing good. Complications: No apparent complications.  Initially attempted on R Radial after RT had attempted, unable to access tiny artery.    Charlotte Sanes 08/17/2019

## 2019-08-17 NOTE — Procedures (Signed)
Chest Tube Insertion Procedure Note  Indications:  Clinically significant LT Pneumothorax inspite of chest tube #1  Pre-operative Diagnosis: Pneumothorax  Post-operative Diagnosis: Pneumothorax  Procedure Details  Informed consent was obtained for the procedure, including sedation.  Risks of lung perforation, hemorrhage, arrhythmia, and adverse drug reaction were discussed.   After sterile skin prep, using standard technique, a second  14 French tube was placed in the left anterior 6th rib space.  Findings: Gush of air , good tidaling in pleurovac  Estimated Blood Loss:  Minimal         Specimens:  None              Complications:  None; patient tolerated the procedure well.         Disposition: ICU - intubated and critically ill.         Condition: stable  Attending Attestation: I performed the procedure.

## 2019-08-17 NOTE — Progress Notes (Signed)
eLink Physician-Brief Progress Note Patient Name: Philip Wells DOB: [birth year] MRN: [id number]   Date of Service  08/17/2019  HPI/Events of Note  Bedside RN calling with worsening shock despite max norepi, vasopressin, HCO3 gtt. SBP 70-80 MAP 50's.   eICU Interventions  Stat CBC, CXR, ABG  Monitor CVP  NS bolus  Add neo gtt      Intervention Category Major Interventions: Shock - evaluation and management  Danford Bad 08/17/2019, 7:59 PM

## 2019-08-17 NOTE — Progress Notes (Signed)
LTM EEG running.  No initial skin breakdown. Push button tested. Neuro notified. 

## 2019-08-17 NOTE — Progress Notes (Signed)
  Echocardiogram 2D Echocardiogram has been performed.  Philip Wells 08/17/2019, 10:27 AM

## 2019-08-17 NOTE — Progress Notes (Signed)
eLink Physician-Brief Progress Note Patient Name: Philip Wells DOB: 22-Jun-1956 MRN: [id number]   Date of Service  08/17/2019  HPI/Events of Note  Oliguria - BP = 94/63 with MAP = 74. Hgb = 14.3.   eICU Interventions  Will order: 1. Monitor CVP now and Q 4 hours.  2. Bolus with 0.9 NaCl 1 liter IV over 1 hour now.      Intervention Category Major Interventions: Other:  Tiny Chaudhary Dennard Nip 08/17/2019, 1:12 AM

## 2019-08-17 NOTE — Progress Notes (Signed)
Hypoglycemic Event  CBG: 61 Treatment: 1 amp D50  Symptoms: none  Follow-up CBG: Time:1230 CBG Result:221  Possible Reasons for Event: NPO  Comments/MD notified:    Marinda Elk

## 2019-08-17 NOTE — Procedures (Signed)
Patient Name: Mikolaj Woolstenhulme  MRN: [id number]  Epilepsy Attending: Charlsie Quest  Referring Physician/Provider: Rutherford Guys, NP Date: 08/17/2019 Duration: 31.41 mins  Patient history: 15 yoM presenting post cardiac arrest. Little known but apparently found down, unknown down time, ROSC after 6 rounds of CPR. Now on TTM. EEG to evaluate for seizure.  Level of alertness: comatose  AEDs during EEG study: None   Technical aspects: This EEG study was done with scalp electrodes positioned according to the 10-20 International system of electrode placement. Electrical activity was acquired at a sampling rate of 500Hz  and reviewed with a high frequency filter of 70Hz  and a low frequency filter of 1Hz . EEG data were recorded continuously and digitally stored.   DESCRIPTION: EEG showed continuous generalized low amplitude 3 to 5 Hz theta-delta slowing.  EEG was reactive to tactile stimulation.  Hyperventilation and photic stimulation were not performed due to AMS.  Abnormality -Continuous slow, generalized  IMPRESSION: This study is suggestive of severe to profound diffuse encephalopathy, nonspecific etiology. No seizures or epileptiform discharges were seen throughout the recording.      Tequilla Cousineau 

## 2019-08-17 NOTE — Progress Notes (Signed)
NAME:  Philip Wells, MRN:  [id number], DOB:  1955-11-29, LOS: 1 ADMISSION DATE:  08/23/2019, CONSULTATION DATE:  09/01/2019 REFERRING MD:  ED - [facility], CHIEF COMPLAINT:  Status post cardiac arrest.   Brief History   13 yoM presenting post cardiac arrest.  Little known but apparently found down, unknown down time, ROSC after 6 rounds of CPR, no shock advised.  Initial temp 33 celsius.  Since has remained unresponsive and progressively hypotensive complicated by afib with RVR, severe metabolic acidosis, and multiple metabolic derangements.    History of present illness   HPI obtained from sister at bedside, Philip Wells [phone].  Patient's initial registration was different [employer].  Sister verifies patient's DOB of September 14, 1955. MRN [id number] is not the correct patient.   Per sister, patient is 83 year AA male with no known past medical history. NKDA, no known medications, non smoker, unknown drug history, and does drink wine. Patient lives with his girlfriend, Philip Wells in [place].  Story is unclear, but he was found unresponsive, unclear time down.  CPR performed by Fire department, reportedly 6 rounds of CPR with non-shockable rhythm prior to [place] [facility] airway placed.    In ER, patient remained unresponsive, initial temp 33 degree celsius, initially normotensive, hypoglycemia, and in atrial tachycardia.  King airway replaced by ETT in ER.    Initially in coded by EMS as possible STEMI, however on cardiology eval in ER, initial rhythm showing aflutter with ratge 140s and STE in V3-4 which had resolved on arrival to ER.  Placed on cardizem gtt for rate control.    Workup noted for WBC 41K, Hgb 13.8, platelets 476, K 5.6, CO2 8, BUN 34, sCr 4.69, corrected Ca 9.3, glucose 57, AST 395, ALT 103, BNP 1142, trop hs 40, lactic  >11, INR 2, neg ETOH, neg tylenol/ salicylate level, MBWG6/ flu neg, CXR with good placement of ETT/ OGT, and nonspecific right perihilar density in which   CT was recommended for followup.  CTH pending.  Patient treated D50, finishing 3L NS, and multiple bicarb pushes.  PCCM asked to admit.   Past Medical History  ETOH use- wine  Significant Hospital Events   N/A  Consults:  Cardiology - Patient R/O for STEMI  Procedures:  2/2 ETT >> 2/2 foley >> 2/2 lSC CVL >> 08/30/2019 left chest tube>> 09/10/2019 left radial A-line>> Significant Diagnostic Tests:  2/2 CXR >> 1. Right perihilar density, differential include scarring versus parenchymal lung mass. CT chest with IV contrast may be useful for further evaluation when clinical situation permits. 2. No complication after intubation. 3. Otherwise no acute process.  2/2 CTH >>  Micro Data:  2/2 SARS 2 / Flu A/b >> neg 2/2 BCx 2 >> 2/2 UC >> 2/2 trach asp >>  Antimicrobials:  2/2 vanc >> 2/2 cefepime >>  Interim history/subjective:  Remains on low-dose vasopressor.  Demonstrates purposeful movement.  Objective   Blood pressure 116/62, pulse 71, temperature 97.7 F (36.5 C), temperature source Bladder, resp. rate (!) 24, height 5\' 11"  (1.803 m), weight 62.3 kg, SpO2 (!) 89 %. CVP:  [6 mmHg-18 mmHg] 11 mmHg  Vent Mode: PRVC FiO2 (%):  [50 %-60 %] 50 % Set Rate:  [24 bmp-28 bmp] 24 bmp Vt Set:  [510 mL-600 mL] 600 mL PEEP:  [5 cmH20] 5 cmH20 Plateau Pressure:  [21 cmH20-29 cmH20] 27 cmH20   Intake/Output Summary (Last 24 hours) at 08/17/2019 0946 Last data filed at 08/17/2019 0900 Gross per 24 hour  Intake 4171.36 ml  Output 1445 ml  Net 2726.36 ml   Filed Weights   06-Sep-2019 2000 08/17/19 0500  Weight: 57.7 kg 62.3 kg    Examination: General: Thin male who sedated on full mechanical ventilatory support HEENT: MM pink/moist, endotracheal tube is in place gastric tube is in place Neuro: Currently sedated on fentanyl.  Reaches for tube attempts to pull out CV: Heart sounds are regular currently in sinus rhythm with a rate of 72 on amiodarone drip PULM: Diminished in bases  left chest tube approximately 1000 cc of cloudy drainage Vent pressure regulated volume control FIO2 50% with sats of 100% PEEP 5 RATE 24 VT 600 plateau pressure of 27  GI: soft, bsx4 active  GU: Extremities: warm/dry,  edema  Skin: no rashes or lesions   Resolved Hospital Problem list   Cardiac arrest   Assessment & Plan:  Cardiac Arrest - unclear etiology. Doesn't appear to be primary cardiac or PE given relatively normal TTE. 08/17/2019 showing purposeful movement EEG is in place Continue to monitor  Critically ill due to undifferentiated shock  Levophed drip wean per protocol    Acute encephalopathy  Hx of ETOH use, wine Minimal sedation Shows purposeful movement EEG   Leukocytosis concerning for septic shock  - unclear etiology at this point P:  Panculture Continue antimicrobial therapy day 2 of vancomycin and cefepime Check procalcitonin   Critically ill due to hypoxic respiratory failure Right perihilar density Vent bundle Status post left chest tube with over 1000 cc for left hydropneumothorax drain Continue left chest tube Wean per protocol  Critically ill due New onset Afib with RVR P:  Currently on amiodarone and has reverted to a sinus rhythm  Monitor electrolytes T   AKI Severe AGMA/ lactic acidosis  Lab Results  Component Value Date   CREATININE 2.70 (H) 08/17/2019   CREATININE 3.38 (H) 09/06/2019    P:  Acidosis resolved but total CO2 remains 18 therefore continue bicarb drip Renal ultrasound without acute obstruction Continue to monitor creatinine noted to be slowly improving   Possible UGIB Recent Labs    09-06-19 2322 06-Sep-2019 2347  HGB 15.3 14.3    P:  Continue to monitor   Mild Transaminitis - ? Shock vs ETOH hx   P:  Continue to monitor LFT and coagulations   Daily Goals Checklist  Pain/Anxiety/Delirium protocol (if indicated): no sedation  VAP protocol (if indicated): bundle in place  Respiratory  support goals: full support Blood pressure target: PE to keep MAP>65 DVT prophylaxis: SCD's only Nutrition Status: high nutritional risk, initiate feeds in am GI prophylaxis: PPI infusion Fluid status goals: CVP targeted fluid resuscitation Urinary catheter: Assessment of intravascular volume Central line: left subclavian TLC Glucose control: monitor for hypoglycemia, consider stress steroids Mobility/therapy needs: bed rest Antibiotic de-escalation: continue empiric pending cultures. Home medication reconciliation: none relevant. Daily labs: CBC, CMP Code Status: Full Family Communication: Spoke to sister and have explained seriousness of his condition. Explained risk of poor neurological recovery. Disposition: ICU   Labs   CBC: Recent Labs  Lab 2019/09/06 2322 Sep 06, 2019 2347  WBC  --  20.7*  HGB 15.3 14.3  HCT 45.0 43.3  MCV  --  98.2  PLT  --  416*    Basic Metabolic Panel: Recent Labs  Lab 09-06-2019 2322 09-06-2019 2347 08/17/19 0714  NA 139 135 139  K 4.2 4.2 4.1  CL  --  103 105  CO2  --  15* 18*  GLUCOSE  --  204* 128*  BUN  --  37* 38*  CREATININE  --  3.38* 2.70*  CALCIUM  --  7.2* 6.7*   GFR: Estimated Creatinine Clearance: 24.4 mL/min (A) (by C-G formula based on SCr of 2.7 mg/dL (H)). Recent Labs  Lab 08/19/2019 2347  WBC 20.7*    Liver Function Tests: No results for input(s): AST, ALT, ALKPHOS, BILITOT, PROT, ALBUMIN in the last 168 hours. No results for input(s): LIPASE, AMYLASE in the last 168 hours. No results for input(s): AMMONIA in the last 168 hours.  ABG    Component Value Date/Time   PHART 7.340 (L) 08/28/2019 2322   PCO2ART 29.9 (L) 08/15/2019 2322   PO2ART 94.0 09/06/2019 2322   HCO3 16.3 (L) 08/15/2019 2322   TCO2 17 (L) 09/06/2019 2322   ACIDBASEDEF 8.0 (H) 08/26/2019 2322   O2SAT 97.0 09/07/2019 2322     Coagulation Profile: Recent Labs  Lab 08/26/2019 2347  INR 2.1*    Cardiac Enzymes: No results for input(s): CKTOTAL,  CKMB, CKMBINDEX, TROPONINI in the last 168 hours.  HbA1C: No results found for: HGBA1C  CBG: Recent Labs  Lab 09/05/2019 2329 08/17/19 0434 08/17/19 0718  GLUCAP 131* 152* 116*     App cct 37 min  Brett Canales Karuna Balducci ACNP Acute Care Nurse Practitioner [facility]/Critical Care Please consult Amion 08/17/2019, 9:46 AM

## 2019-08-17 NOTE — Progress Notes (Signed)
Pt arrived from ED around 1950. Pt was a 36 degree TTM, which was initiated at 2000. Pt did not have the correct identification when he arrived, but the problem was resolved approximately 2130 and correct pt identification and orders were placed. Pt was on an amiodarone gtt of 60mg /hr when he arrived.

## 2019-08-17 NOTE — ED Triage Notes (Signed)
Registration clerk advised RN that patient can be discharge from Epic , new chart/corrected name being used at ICU.

## 2019-08-18 ENCOUNTER — Inpatient Hospital Stay (HOSPITAL_COMMUNITY): Payer: Self-pay

## 2019-08-18 DIAGNOSIS — R6521 Severe sepsis with septic shock: Secondary | ICD-10-CM

## 2019-08-18 DIAGNOSIS — J9690 Respiratory failure, unspecified, unspecified whether with hypoxia or hypercapnia: Secondary | ICD-10-CM

## 2019-08-18 DIAGNOSIS — Z9889 Other specified postprocedural states: Secondary | ICD-10-CM

## 2019-08-18 DIAGNOSIS — Z4682 Encounter for fitting and adjustment of non-vascular catheter: Secondary | ICD-10-CM

## 2019-08-18 DIAGNOSIS — A419 Sepsis, unspecified organism: Secondary | ICD-10-CM

## 2019-08-18 DIAGNOSIS — N179 Acute kidney failure, unspecified: Secondary | ICD-10-CM

## 2019-08-18 LAB — PROCALCITONIN: Procalcitonin: 41.05 ng/mL

## 2019-08-18 LAB — CBG MONITORING, ED: Glucose-Capillary: 21 mg/dL — CL (ref 70–99)

## 2019-08-18 LAB — POCT I-STAT 7, (LYTES, BLD GAS, ICA,H+H)
Acid-Base Excess: 8 mmol/L — ABNORMAL HIGH (ref 0.0–2.0)
Bicarbonate: 29.6 mmol/L — ABNORMAL HIGH (ref 20.0–28.0)
Calcium, Ion: 0.87 mmol/L — CL (ref 1.15–1.40)
HCT: 34 % — ABNORMAL LOW (ref 39.0–52.0)
Hemoglobin: 11.6 g/dL — ABNORMAL LOW (ref 13.0–17.0)
O2 Saturation: 93 %
Patient temperature: 37.4
Potassium: 3.9 mmol/L (ref 3.5–5.1)
Sodium: 141 mmol/L (ref 135–145)
TCO2: 31 mmol/L (ref 22–32)
pCO2 arterial: 32.1 mmHg (ref 32.0–48.0)
pH, Arterial: 7.573 — ABNORMAL HIGH (ref 7.350–7.450)
pO2, Arterial: 57 mmHg — ABNORMAL LOW (ref 83.0–108.0)

## 2019-08-18 LAB — COMPREHENSIVE METABOLIC PANEL
ALT: 226 U/L — ABNORMAL HIGH (ref 0–44)
AST: 941 U/L — ABNORMAL HIGH (ref 15–41)
Albumin: 1.1 g/dL — ABNORMAL LOW (ref 3.5–5.0)
Alkaline Phosphatase: 41 U/L (ref 38–126)
Anion gap: 13 (ref 5–15)
BUN: 39 mg/dL — ABNORMAL HIGH (ref 8–23)
CO2: 19 mmol/L — ABNORMAL LOW (ref 22–32)
Calcium: 6.1 mg/dL — CL (ref 8.9–10.3)
Chloride: 108 mmol/L (ref 98–111)
Creatinine, Ser: 2.38 mg/dL — ABNORMAL HIGH (ref 0.61–1.24)
GFR calc Af Amer: 32 mL/min — ABNORMAL LOW (ref >60)
GFR calc non Af Amer: 28 mL/min — ABNORMAL LOW (ref >60)
Glucose, Bld: 169 mg/dL — ABNORMAL HIGH (ref 70–99)
Potassium: 4.2 mmol/L (ref 3.5–5.1)
Sodium: 140 mmol/L (ref 135–145)
Total Bilirubin: 1.1 mg/dL (ref 0.3–1.2)
Total Protein: 4.1 g/dL — ABNORMAL LOW (ref 6.5–8.1)

## 2019-08-18 LAB — CBC
HCT: 35.8 % — ABNORMAL LOW (ref 39.0–52.0)
Hemoglobin: 11.9 g/dL — ABNORMAL LOW (ref 13.0–17.0)
MCH: 32 pg (ref 26.0–34.0)
MCHC: 33.2 g/dL (ref 30.0–36.0)
MCV: 96.2 fL (ref 80.0–100.0)
Platelets: 277 10*3/uL (ref 150–400)
RBC: 3.72 MIL/uL — ABNORMAL LOW (ref 4.22–5.81)
RDW: 13.2 % (ref 11.5–15.5)
WBC: 13.2 10*3/uL — ABNORMAL HIGH (ref 4.0–10.5)
nRBC: 1.1 % — ABNORMAL HIGH (ref 0.0–0.2)

## 2019-08-18 LAB — BASIC METABOLIC PANEL
Anion gap: 16 — ABNORMAL HIGH (ref 5–15)
BUN: 36 mg/dL — ABNORMAL HIGH (ref 8–23)
CO2: 16 mmol/L — ABNORMAL LOW (ref 22–32)
Calcium: 6.4 mg/dL — CL (ref 8.9–10.3)
Chloride: 112 mmol/L — ABNORMAL HIGH (ref 98–111)
Creatinine, Ser: 2.29 mg/dL — ABNORMAL HIGH (ref 0.61–1.24)
GFR calc Af Amer: 34 mL/min — ABNORMAL LOW (ref >60)
GFR calc non Af Amer: 29 mL/min — ABNORMAL LOW (ref >60)
Glucose, Bld: 99 mg/dL (ref 70–99)
Potassium: 3.7 mmol/L (ref 3.5–5.1)
Sodium: 144 mmol/L (ref 135–145)

## 2019-08-18 LAB — GLUCOSE, CAPILLARY
Glucose-Capillary: 168 mg/dL — ABNORMAL HIGH (ref 70–99)
Glucose-Capillary: 187 mg/dL — ABNORMAL HIGH (ref 70–99)
Glucose-Capillary: 210 mg/dL — ABNORMAL HIGH (ref 70–99)
Glucose-Capillary: 230 mg/dL — ABNORMAL HIGH (ref 70–99)

## 2019-08-18 LAB — PHOSPHORUS
Phosphorus: 4 mg/dL (ref 2.5–4.6)
Phosphorus: 2.9 mg/dL (ref 2.5–4.6)

## 2019-08-18 LAB — MAGNESIUM
Magnesium: 1.6 mg/dL — ABNORMAL LOW (ref 1.7–2.4)
Magnesium: 2.3 mg/dL (ref 1.7–2.4)

## 2019-08-18 LAB — LACTIC ACID, PLASMA: Lactic Acid, Venous: 8.9 mmol/L (ref 0.5–1.9)

## 2019-08-18 LAB — VANCOMYCIN, RANDOM: Vancomycin Rm: 4

## 2019-08-18 MED ORDER — MAGNESIUM SULFATE 2 GM/50ML IV SOLN
2.0000 g | Freq: Once | INTRAVENOUS | Status: AC
  Administered 2019-08-18: 2 g via INTRAVENOUS
  Filled 2019-08-18: qty 50

## 2019-08-18 MED ORDER — SODIUM BICARBONATE-DEXTROSE 150-5 MEQ/L-% IV SOLN
150.0000 meq | INTRAVENOUS | Status: DC
  Administered 2019-08-18 – 2019-08-19 (×4): 150 meq via INTRAVENOUS
  Filled 2019-08-18 (×6): qty 1000

## 2019-08-18 MED ORDER — CALCIUM GLUCONATE-NACL 1-0.675 GM/50ML-% IV SOLN
1.0000 g | Freq: Once | INTRAVENOUS | Status: AC
  Filled 2019-08-18: qty 50

## 2019-08-18 MED ORDER — SODIUM CHLORIDE 0.9 % IV SOLN
1.2500 ng/kg/min | INTRAVENOUS | Status: DC
  Administered 2019-08-18: 5 ng/kg/min via INTRAVENOUS
  Filled 2019-08-18: qty 1

## 2019-08-18 MED ORDER — LEVETIRACETAM IN NACL 500 MG/100ML IV SOLN
500.0000 mg | Freq: Two times a day (BID) | INTRAVENOUS | Status: DC
  Administered 2019-08-18 – 2019-08-24 (×12): 500 mg via INTRAVENOUS
  Filled 2019-08-18 (×12): qty 100

## 2019-08-18 MED ORDER — VITAL HIGH PROTEIN PO LIQD: 1000.0000 mL | ORAL | Status: DC

## 2019-08-18 MED ORDER — MIDAZOLAM 50MG/50ML (1MG/ML) PREMIX INFUSION
0.5000 mg/h | INTRAVENOUS | Status: DC
  Administered 2019-08-18: 0.5 mg/h via INTRAVENOUS
  Administered 2019-08-18: 1.5 mg/h via INTRAVENOUS
  Filled 2019-08-18 (×2): qty 50

## 2019-08-18 MED ORDER — CALCIUM GLUCONATE-NACL 1-0.675 GM/50ML-% IV SOLN
1.0000 g | Freq: Once | INTRAVENOUS | Status: AC
  Administered 2019-08-18: 1000 mg via INTRAVENOUS
  Filled 2019-08-18: qty 50

## 2019-08-18 MED ORDER — HYDROCORTISONE NA SUCCINATE PF 100 MG IJ SOLR
50.0000 mg | Freq: Four times a day (QID) | INTRAMUSCULAR | Status: DC
  Administered 2019-08-18 – 2019-08-21 (×13): 50 mg via INTRAVENOUS
  Filled 2019-08-18 (×13): qty 2

## 2019-08-18 MED ORDER — PRO-STAT SUGAR FREE PO LIQD
30.0000 mL | Freq: Two times a day (BID) | ORAL | Status: DC
  Filled 2019-08-18: qty 30

## 2019-08-18 MED ORDER — VANCOMYCIN HCL 1250 MG/250ML IV SOLN
1250.0000 mg | Freq: Once | INTRAVENOUS | Status: AC
  Administered 2019-08-18: 1250 mg via INTRAVENOUS
  Filled 2019-08-18: qty 250

## 2019-08-18 MED ORDER — VITAL AF 1.2 CAL PO LIQD
1000.0000 mL | ORAL | Status: DC
  Administered 2019-08-18 – 2019-08-23 (×4): 1000 mL
  Filled 2019-08-18 (×2): qty 1000

## 2019-08-18 NOTE — Progress Notes (Addendum)
eLink Physician-Brief Progress Note Patient Name: Philip Wells DOB: [birth year] MRN: [id number]   Date of Service  08/18/2019  HPI/Events of Note  Ca++ = 6.4., K+ = 3.7 (normal range) and Creatinine = 2.29  eICU Interventions  Will order: 1. Replace Ca++.     Intervention Category Major Interventions: Electrolyte abnormality - evaluation and management  TRUE Shackleford Eugene 08/18/2019, 1:52 AM

## 2019-08-18 NOTE — Procedures (Addendum)
Patient Name: Philip Wells  MRN: [id number]  Epilepsy Attending: Charlsie Quest  Referring Physician/Provider: Rutherford Guys, NP Duration: 08/17/2019 0947 to 08/18/2019 1001  Patient history: 1 yoM presenting post cardiac arrest. Little known but apparently found down, unknown down time, ROSC after 6 rounds of CPR. Now on TTM. EEG to evaluate for seizure.  Level of alertness: awake, asleep ( sedated)  AEDs during EEG study: Versed  Technical aspects: This EEG study was done with scalp electrodes positioned according to the 10-20 International system of electrode placement. Electrical activity was acquired at a sampling rate of 500Hz  and reviewed with a high frequency filter of 70Hz  and a low frequency filter of 1Hz . EEG data were recorded continuously and digitally stored.   DESCRIPTION: No posterior dominant rhythm was seen. EEG showed continuous generalized low amplitude 3 to 6 Hz theta-delta slowing.  EEG was reactive to tactile stimulation. Hyperventilation and photic stimulation were not performed due to AMS.  Abnormality -Continuous slow, generalized  IMPRESSION: This study is suggestive of severe to profound diffuse encephalopathy, nonspecific etiology. No seizures or epileptiform discharges were seen throughout the recording.

## 2019-08-18 NOTE — Progress Notes (Signed)
NAME:  Philip Wells, MRN:  [id number], DOB:  [birth year], LOS: 2 ADMISSION DATE:  08/30/2019, CONSULTATION DATE:  08/24/2019 REFERRING MD:  ED - [facility], CHIEF COMPLAINT:  Status post cardiac arrest.   Brief History   71 yoM presenting post cardiac arrest.  Little known but apparently found down, unknown down time, ROSC after 6 rounds of CPR, no shock advised.  Initial temp 33 celsius.  Since has remained unresponsive and progressively hypotensive complicated by afib with RVR, severe metabolic acidosis, and multiple metabolic derangements.    History of present illness   HPI obtained from sister at bedside, Philip Wells [phone].  Patient's initial registration was different [employer].  Sister verifies patient's DOB of Apr 16, 1956. MRN [id number] is not the correct patient.   Per sister, patient is 23 year AA male with no known past medical history. NKDA, no known medications, non smoker, unknown drug history, and does drink wine. Patient lives with his girlfriend, Philip Wells in [place].  Story is unclear, but he was found unresponsive, unclear time down.  CPR performed by Fire department, reportedly 6 rounds of CPR with non-shockable rhythm prior to [place] [facility] airway placed.    In ER, patient remained unresponsive, initial temp 33 degree celsius, initially normotensive, hypoglycemia, and in atrial tachycardia.  [facility] airway replaced by ETT in ER.    Initially in coded by EMS as possible STEMI, however on cardiology eval in ER, initial rhythm showing aflutter with ratge 140s and STE in V3-4 which had resolved on arrival to ER.  Placed on cardizem gtt for rate control.    Workup noted for WBC 41K, Hgb 13.8, platelets 476, K 5.6, CO2 8, BUN 34, sCr 4.69, corrected Ca 9.3, glucose 57, AST 395, ALT 103, BNP 1142, trop hs 40, lactic  >11, INR 2, neg ETOH, neg tylenol/ salicylate level, EXBM8/ flu neg, CXR with good placement of ETT/ OGT, and nonspecific right perihilar density in which  CT was recommended for followup.  CTH pending.  Patient treated D50, finishing 3L NS, and multiple bicarb pushes.  PCCM asked to admit.   Past Medical History  ETOH use- wine  Significant Hospital Events   N/A  Consults:  Cardiology - Patient R/O for STEMI  Procedures:  2/2 ETT >> 2/2 foley >> 2/2 lSC CVL >> 08/17/2019 left chest tube>> 2/3 left Chest tube #2 >> 09/07/2019 left radial A-line>>  Significant Diagnostic Tests:   2/2 CTH > on separate chart, needs merged. I'm told it is negative for hemorrhage.   2/3 Renal US > Increased echogenicity consistent with medical renal disease  Micro Data:  2/2 SARS 2 / Flu A/b >> neg 2/2 BCx 2 >> 2/2 UC >> 2/2 trach asp >> mod GPC >>>  Antimicrobials:  2/2 vanc >> 2/2 cefepime >>  Interim history/subjective:  Escalating pressor needs last 24 hours. Maxed on 3 pressors and giaprezza added. Additional L chest tube placed overnight for clinically significant pneumothorax despite existing chest tube. Code status changed to DNR overnight.   Objective   Blood pressure 105/67, pulse (!) 57, temperature 98.4 F (36.9 C), temperature source Bladder, resp. rate (!) 24, height 5' 11"  (1.803 m), weight 62.3 kg, SpO2 100 %. CVP:  [3 mmHg-16 mmHg] 16 mmHg  Vent Mode: PRVC FiO2 (%):  [50 %] 50 % Set Rate:  [24 bmp] 24 bmp Vt Set:  [600 mL] 600 mL PEEP:  [5 cmH20] 5 cmH20 Plateau Pressure:  [21 cmH20-30 cmH20] 30 cmH20  Intake/Output Summary (Last 24 hours) at 08/18/2019 0736 Last data filed at 08/18/2019 0700 Gross per 24 hour  Intake 7605.58 ml  Output 1218 ml  Net 6387.58 ml   Filed Weights   08/17/2019 2000 08/17/19 0500  Weight: 57.7 kg 62.3 kg    Examination:  General: Elderly male of normal body habitus on vent.  HEENT: /AT, PERRL, no JVD Neuro: Deeply sedated. RN tells me when he wakes he will posture vs reach for tube.  CV: RRR, no MRG.  PULM: Scattered rhonchi GI: soft, bsx4 active  GU: Foley draining scant clear yellow  urine Extremities: warm/dry,  edema  Skin: no rashes or lesions   Resolved Hospital Problem list   Cardiac arrest   Assessment & Plan:   Cardiac Arrest - unclear etiology. Doesn't appear to be primary cardiac or PE given relatively normal TTE. Some questions of whether he was moving purposefully yesterday before requiring deep sedation.  - EEG is in place - Continue to monitor - DNR if arrests  Critically ill due to undifferentiated shock: Questionably septic shock given GPC on tracheal aspirate, leukocytosis, and PCT 40. Echo with LVEF 50% and no improvement with PTC reexpansion.  - ICU hemodynamic monitoring - Levophed, Vaso, Giaprezza, and phenylephrine - MAP goal > 71mHG - ABX as above, follow cultures - Stress steroids - Wean phenylephrine as able. Seems to have responded well to [place]    Acute encephalopathy  Hx of ETOH use, wine - Requiring deep sedation. Becomes hemodynamically unstable when agitated.   Acute hypoxemic respiratory failure - Full vent support - Minimal settings - ABG PRN - VAP bundle  Left sided hydropneumothorax: CT placed 2/2 (With 1L fluid drained), worsening PTX on 2/3 so additional tube placed.  - Approx 10% L sided ptx remains despite both chest tubes to suction - Continue left chest tube to suction - Flush/Wean per protocol  Critically ill due New onset Afib with RVR: converted with amio - Continue amiodarone - Monitor electrolytes  AKI minimal UOP, seems volume up on exam (8L pos) Hypomag Hypocalcemia (corrects to near normal) - Continue bicarb infusion for now - Follow BMP - Supp Mag - Consider lasix infusion - Hope he does not develop a need for RRT.  Possible UGIB  - Protonix infusion until tomorrow then BID  Mild Transaminitis- ? Shock vs ETOH hx   - Continue to monitor LFT and coagulations   Daily Goals Checklist  Pain/Anxiety/Delirium protocol (if indicated): Versed, fentanyl. RASS goal -2 VAP protocol (if  indicated): bundle in place  DVT prophylaxis: SCD's only Nutrition Status: start TF GI prophylaxis: PPI infusion Urinary catheter: Assessment of intravascular volume Central line: left subclavian TLC Glucose control: monitor for hypoglycemia, consider stress steroids Mobility/therapy needs: bed rest Code Status: DNR Family: Disposition: ICU   Labs   CBC: Recent Labs  Lab 09/08/2019 2347 08/23/2019 2347 08/17/19 1025 08/17/19 2015 08/17/19 2017 08/17/19 2152 08/18/19 0457  WBC 20.7*  --   --  16.4*  --   --  13.2*  NEUTROABS  --   --   --  15.7*  --   --   --   HGB 14.3   < > 15.3 13.5 14.6 11.6* 11.9*  HCT 43.3   < > 45.0 41.0 43.0 34.0* 35.8*  MCV 98.2  --   --  98.8  --   --  96.2  PLT 416*  --   --  337  --   --  277   < > =  values in this interval not displayed.    Basic Metabolic Panel: Recent Labs  Lab 08/26/2019 2347 08/15/2019 2347 08/17/19 0714 08/17/19 0714 08/17/19 1025 08/17/19 2017 08/17/19 2152 08/18/19 0012 08/18/19 0457  NA 135   < > 139   < > 139 139 145 144 140  K 4.2   < > 4.1   < > 4.0 4.5 4.1 3.7 4.2  CL 103  --  105  --   --   --   --  112* 108  CO2 15*  --  18*  --   --   --   --  16* 19*  GLUCOSE 204*  --  128*  --   --   --   --  99 169*  BUN 37*  --  38*  --   --   --   --  36* 39*  CREATININE 3.38*  --  2.70*  --   --   --   --  2.29* 2.38*  CALCIUM 7.2*  --  6.7*  --   --   --   --  6.4* 6.1*  MG  --   --   --   --   --   --   --   --  1.6*  PHOS  --   --   --   --   --   --   --   --  4.0   < > = values in this interval not displayed.   GFR: Estimated Creatinine Clearance: 27.6 mL/min (A) (by C-G formula based on SCr of 2.38 mg/dL (H)). Recent Labs  Lab 09/01/2019 2347 08/17/19 1000 08/17/19 2015 08/17/19 2035 08/17/19 2315 08/18/19 0457  PROCALCITON  --  72.79  --   --   --  41.05  WBC 20.7*  --  16.4*  --   --  13.2*  LATICACIDVEN  --   --   --  6.5* 8.9*  --     Liver Function Tests: Recent Labs  Lab 08/18/19 0457  AST  941*  ALT 226*  ALKPHOS 41  BILITOT 1.1  PROT 4.1*  ALBUMIN 1.1*   No results for input(s): LIPASE, AMYLASE in the last 168 hours. No results for input(s): AMMONIA in the last 168 hours.  ABG    Component Value Date/Time   PHART 7.326 (L) 08/17/2019 2152   PCO2ART 38.8 08/17/2019 2152   PO2ART 80.0 (L) 08/17/2019 2152   HCO3 20.5 08/17/2019 2152   TCO2 22 08/17/2019 2152   ACIDBASEDEF 5.0 (H) 08/17/2019 2152   O2SAT 74.2 08/17/2019 2220     Coagulation Profile: Recent Labs  Lab 09/05/2019 2347  INR 2.1*    Cardiac Enzymes: No results for input(s): CKTOTAL, CKMB, CKMBINDEX, TROPONINI in the last 168 hours.  HbA1C: No results found for: HGBA1C  CBG: Recent Labs  Lab 08/17/19 1240 08/17/19 1558 08/17/19 1952 08/17/19 2306 08/18/19 0543  GLUCAP 221* 94 90 83 168*     Georgann Housekeeper, AGACNP-BC [place] for personal pager PCCM on call pager [phone]  08/18/2019 7:41 AM

## 2019-08-18 NOTE — Progress Notes (Signed)
Pharmacy Antibiotic Note  Philip Wells is a 64 y.o. male admitted on 09/04/2019 with post-cardiac arrest. Vanc random level 4 mcg/ml  Plan: -Vancomycin 1250mg  IV x1 -F/U random level in ~2 days for redose   Height: 5\' 11"  (180.3 cm) Weight: 137 lb 5.9 oz (62.3 kg) IBW/kg (Calculated) : 75.3  Temp (24hrs), Avg:96.8 F (36 C), Min:95.5 F (35.3 C), Max:98.2 F (36.8 C)  Recent Labs  Lab 08/30/2019 2347 08/17/19 0714 08/17/19 2015 08/17/19 2035 08/17/19 2315  WBC 20.7*  --  16.4*  --   --   CREATININE 3.38* 2.70*  --   --   --   LATICACIDVEN  --   --   --  6.5* 8.9*  VANCORANDOM  --   --   --   --  4    Estimated Creatinine Clearance: 24.4 mL/min (A) (by C-G formula based on SCr of 2.7 mg/dL (H)).    No Known Allergies  Antimicrobials this admission: Cefepime 2/2 >>  Vancomycin 2/2 >>   Thanks for allowing pharmacy to be a part of this patient's care.  2036, PharmD Clinical Pharmacist  08/18/2019

## 2019-08-18 NOTE — Progress Notes (Signed)
LTM EEG complete. No skin breakdown °

## 2019-08-18 NOTE — Progress Notes (Signed)
eLink Physician-Brief Progress Note Patient Name: Philip Wells DOB: 1956-06-01 MRN: [id number]   Date of Service  08/18/2019  HPI/Events of Note  Hypocalcemia - Ca++ = 6.1.   eICU Interventions  Will replace Ca++.     Intervention Category Major Interventions: Electrolyte abnormality - evaluation and management  Hiroyuki Ozanich Eugene 08/18/2019, 6:33 AM

## 2019-08-18 NOTE — Progress Notes (Signed)
After spiking a new bag of fentanyl, I saw that it was leaking on the floor. The seam was split on the bottom of the bag. I showed Myles, RN so that he could witness and waste in the pyxis for me.

## 2019-08-18 NOTE — Progress Notes (Signed)
Initial Nutrition Assessment  DOCUMENTATION CODES:   Non-severe (moderate) malnutrition in context of chronic illness  INTERVENTION:   Tube Feeding:  Vital AF 1.2 at 60 ml/hr Begin at 30 ml/hr; titrate by 10 mL q 8 hours until goal rate of 60 ml/hr Provides 1728 kcals, 108 g of protein and 1166 mL of free water   NUTRITION DIAGNOSIS:   Moderate Malnutrition related to chronic illness, social / environmental circumstances as evidenced by mild fat depletion, moderate muscle depletion.   GOAL:   Patient will meet greater than or equal to 90% of their needs  MONITOR:   Vent status, Labs, Weight trends, TF tolerance, Skin  REASON FOR ASSESSMENT:   Consult, Ventilator Enteral/tube feeding initiation and management  ASSESSMENT:   64 yo male admitted post cardiac arrest with nonshockable rhythm with estimated downtime of 30 minutes, acute respiratory failure requiring intubation, shock, ARF with metabolic acidosis and oliguria. PMH includes EtOH use (negative EtOH on admission)   Currently on TTM, 36 degrees, no paralytic  Patient is currently intubated on ventilator support, sedated with fentanyl and versed drips. Pt requiring levophed and vasopressin as well as amiodarone and angiotensin II drips MV: 15.2 L/min Temp (24hrs), Avg:97.5 F (36.4 C), Min:95.5 F (35.3 C), Max:98.8 F (37.1 C)  Admission weight 57.7 kg; current wt 62.3 kg from 2/03. No new weight today. Net +9 L  OG tube enters stomach per chest xray report  Labs: CBGs 43-210, phosphorus wdl, magnesium 1.6 (L), potassium wdl, Creatinine 2.38, BUN 39 Meds: sodium bicarb at 150 ml/hr   NUTRITION - FOCUSED PHYSICAL EXAM:    Most Recent Value  Orbital Region  Moderate depletion  Upper Arm Region  Mild depletion  Buccal Region  Unable to assess  Temple Region  Moderate depletion  Clavicle Bone Region  Moderate depletion  Clavicle and Acromion Bone Region  Moderate depletion  Scapular Bone Region   Moderate depletion  Dorsal Hand  Unable to assess  Patellar Region  Unable to assess  Anterior Thigh Region  Unable to assess  Posterior Calf Region  Unable to assess  Edema (RD Assessment)  Mild       Diet Order:   Diet Order    None      EDUCATION NEEDS:   Not appropriate for education at this time  Skin:  Skin Assessment: Reviewed RN Assessment  Last BM:  no documented BM  Height:   Ht Readings from Last 1 Encounters:  08/19/2019 5\' 11"  (1.803 m)    Weight:   Wt Readings from Last 1 Encounters:  08/17/19 62.3 kg    BMI:  Body mass index is 19.16 kg/m.  Estimated Nutritional Needs:   Kcal:  1774 kcals  Protein:  87-116 g  Fluid:  >/= 1.7 L   10/15/19 MS, RDN, LDN, CNSC RD Pager Number and RD Weekend/On-CallAfter Hours Pager Located in [place]

## 2019-08-18 NOTE — Progress Notes (Addendum)
PCCM Interval Progress Note  Called to assess pt bedside for ongoing shock.  SBP anywhere from 60 to low 90s.  Currently 87 with MAP 56. On 121mcg/min norepinephrine, 0.03u/min vasopressin, 464mcg/min phenylephrine. CVP 16.  ABG from 2200 better then expected and acidosis improved.  Chest tube #2 placed earlier in the night for new PTX. 100mg  hydrocortisone administered at 2215. BMP pending.  Will add giapreza infusion as last ditch effort. ELINK to follow BMP once resulted. Continue abx, HCO3 infusion, pressors.  Attempted to call pt's sister [phone]) but no answer despite two calls.   ADDENDUM: I was able to reach pt's sister Philip Wells on the phone.  I had an extensive discussion with her regarding Philip Wells current circumstances, organ failures, and refractory shock.  I informed her that I am attempting to add giapreza and if he does not respond despite this and all current meds, then I do not have much else to offer unfortunately. We also discussed patient's prior wishes under circumstances such as this. Hal Neer has agreed that if pt continues to decline despite all above efforts, then CPR / defib would be futile.  She is in agreement to change code status to DNR if arrest were to occur, but to otherwise continue with current medical support / therapies.  Total additional CC time (from 2/3 into AM 2/4):  35 min.   Steward Drone, [facility] 08/18/2019, 12:25 AM

## 2019-08-18 NOTE — Progress Notes (Signed)
LTM maint complete - no skin breakdown under: FP1, FP2,F3,A1

## 2019-08-18 NOTE — Progress Notes (Signed)
eLink Physician-Brief Progress Note Patient Name: Philip Wells DOB: 11-Jul-1956 MRN: [id number]   Date of Service  08/18/2019  HPI/Events of Note  Agitation   eICU Interventions  Will order: 1. Increase ceiling on Fentanyl IV infusion to 300 mcg/hour. Titrate to RASS = 0.  2. Low dose Versed IV infusion. Titrate to RASS = 0.      Intervention Category Major Interventions: Delirium, psychosis, severe agitation - evaluation and management  [name] 08/18/2019, 3:54 AM

## 2019-08-18 NOTE — Progress Notes (Signed)
Upon arrival to ED, pt was registered under wrong name, wrong birth date, and wrong MRN #. This error was caught, however the testing done while in the ED is still showing under the wrong patient. I have made some calls and hopefully the results from labs and imaging will be viewable in Epic under this MRN soon. Meanwhile,  I printed hard copies of the results and placed in patient's hard chart.

## 2019-08-18 NOTE — Progress Notes (Signed)
CRITICAL VALUE ALERT  Critical Value:  Calcium Ionized 0.87  Date & Time Notied:  08/18/19 2357  Provider Notified: Elink/Sommer MD  Orders Received/Actions taken: will replace see new orders  Malva Limes RN

## 2019-08-19 ENCOUNTER — Inpatient Hospital Stay (HOSPITAL_COMMUNITY): Payer: Self-pay

## 2019-08-19 DIAGNOSIS — E43 Unspecified severe protein-calorie malnutrition: Secondary | ICD-10-CM

## 2019-08-19 LAB — POCT I-STAT 7, (LYTES, BLD GAS, ICA,H+H)
Acid-Base Excess: 4 mmol/L — ABNORMAL HIGH (ref 0.0–2.0)
Acid-Base Excess: 3 mmol/L — ABNORMAL HIGH (ref 0.0–2.0)
Bicarbonate: 29 mmol/L — ABNORMAL HIGH (ref 20.0–28.0)
Bicarbonate: 28.2 mmol/L — ABNORMAL HIGH (ref 20.0–28.0)
Calcium, Ion: 0.97 mmol/L — ABNORMAL LOW (ref 1.15–1.40)
Calcium, Ion: 0.85 mmol/L — CL (ref 1.15–1.40)
HCT: 35 % — ABNORMAL LOW (ref 39.0–52.0)
HCT: 29 % — ABNORMAL LOW (ref 39.0–52.0)
Hemoglobin: 11.9 g/dL — ABNORMAL LOW (ref 13.0–17.0)
Hemoglobin: 9.9 g/dL — ABNORMAL LOW (ref 13.0–17.0)
O2 Saturation: 90 %
O2 Saturation: 91 %
Patient temperature: 37.3
Patient temperature: 36.9
Potassium: 3.6 mmol/L (ref 3.5–5.1)
Potassium: 3.3 mmol/L — ABNORMAL LOW (ref 3.5–5.1)
Sodium: 140 mmol/L (ref 135–145)
Sodium: 144 mmol/L (ref 135–145)
TCO2: 30 mmol/L (ref 22–32)
TCO2: 30 mmol/L (ref 22–32)
pCO2 arterial: 43.6 mmHg (ref 32.0–48.0)
pCO2 arterial: 44.5 mmHg (ref 32.0–48.0)
pH, Arterial: 7.433 (ref 7.350–7.450)
pH, Arterial: 7.41 (ref 7.350–7.450)
pO2, Arterial: 58 mmHg — ABNORMAL LOW (ref 83.0–108.0)
pO2, Arterial: 60 mmHg — ABNORMAL LOW (ref 83.0–108.0)

## 2019-08-19 LAB — CULTURE, RESPIRATORY W GRAM STAIN

## 2019-08-19 LAB — CBC WITH DIFFERENTIAL/PLATELET
Abs Immature Granulocytes: 0 10*3/uL (ref 0.00–0.07)
Basophils Absolute: 0 10*3/uL (ref 0.0–0.1)
Basophils Relative: 0 %
Eosinophils Absolute: 0 10*3/uL (ref 0.0–0.5)
Eosinophils Relative: 0 %
HCT: 32.1 % — ABNORMAL LOW (ref 39.0–52.0)
Hemoglobin: 11 g/dL — ABNORMAL LOW (ref 13.0–17.0)
Lymphocytes Relative: 1 %
Lymphs Abs: 0.1 10*3/uL — ABNORMAL LOW (ref 0.7–4.0)
MCH: 32.1 pg (ref 26.0–34.0)
MCHC: 34.3 g/dL (ref 30.0–36.0)
MCV: 93.6 fL (ref 80.0–100.0)
Monocytes Absolute: 1.4 10*3/uL — ABNORMAL HIGH (ref 0.1–1.0)
Monocytes Relative: 11 %
Neutro Abs: 11.5 10*3/uL — ABNORMAL HIGH (ref 1.7–7.7)
Neutrophils Relative %: 88 %
Platelets: 239 10*3/uL (ref 150–400)
RBC: 3.43 MIL/uL — ABNORMAL LOW (ref 4.22–5.81)
RDW: 13 % (ref 11.5–15.5)
WBC: 13.1 10*3/uL — ABNORMAL HIGH (ref 4.0–10.5)
nRBC: 21 /100 WBC — ABNORMAL HIGH
nRBC: 5.6 % — ABNORMAL HIGH (ref 0.0–0.2)

## 2019-08-19 LAB — GLUCOSE, CAPILLARY
Glucose-Capillary: 189 mg/dL — ABNORMAL HIGH (ref 70–99)
Glucose-Capillary: 189 mg/dL — ABNORMAL HIGH (ref 70–99)
Glucose-Capillary: 53 mg/dL — ABNORMAL LOW (ref 70–99)
Glucose-Capillary: 205 mg/dL — ABNORMAL HIGH (ref 70–99)
Glucose-Capillary: 122 mg/dL — ABNORMAL HIGH (ref 70–99)
Glucose-Capillary: 119 mg/dL — ABNORMAL HIGH (ref 70–99)
Glucose-Capillary: 131 mg/dL — ABNORMAL HIGH (ref 70–99)

## 2019-08-19 LAB — COMPREHENSIVE METABOLIC PANEL
ALT: 248 U/L — ABNORMAL HIGH (ref 0–44)
AST: 653 U/L — ABNORMAL HIGH (ref 15–41)
Albumin: 1.1 g/dL — ABNORMAL LOW (ref 3.5–5.0)
Alkaline Phosphatase: 57 U/L (ref 38–126)
Anion gap: 9 (ref 5–15)
BUN: 43 mg/dL — ABNORMAL HIGH (ref 8–23)
CO2: 30 mmol/L (ref 22–32)
Calcium: 6.4 mg/dL — CL (ref 8.9–10.3)
Chloride: 101 mmol/L (ref 98–111)
Creatinine, Ser: 2.49 mg/dL — ABNORMAL HIGH (ref 0.61–1.24)
GFR calc Af Amer: 30 mL/min — ABNORMAL LOW (ref >60)
GFR calc non Af Amer: 26 mL/min — ABNORMAL LOW (ref >60)
Glucose, Bld: 188 mg/dL — ABNORMAL HIGH (ref 70–99)
Potassium: 3.8 mmol/L (ref 3.5–5.1)
Sodium: 140 mmol/L (ref 135–145)
Total Bilirubin: 0.7 mg/dL (ref 0.3–1.2)
Total Protein: 4.1 g/dL — ABNORMAL LOW (ref 6.5–8.1)

## 2019-08-19 LAB — HEMOGLOBIN A1C
Hgb A1c MFr Bld: 5.3 % (ref 4.8–5.6)
Mean Plasma Glucose: 105.41 mg/dL

## 2019-08-19 LAB — VANCOMYCIN, RANDOM: Vancomycin Rm: 14

## 2019-08-19 LAB — MAGNESIUM
Magnesium: 1.9 mg/dL (ref 1.7–2.4)
Magnesium: 1.9 mg/dL (ref 1.7–2.4)

## 2019-08-19 LAB — PHOSPHORUS
Phosphorus: 3.2 mg/dL (ref 2.5–4.6)
Phosphorus: 4.4 mg/dL (ref 2.5–4.6)

## 2019-08-19 LAB — PROCALCITONIN: Procalcitonin: 38.9 ng/mL

## 2019-08-19 LAB — PATHOLOGIST SMEAR REVIEW

## 2019-08-19 MED ORDER — PANTOPRAZOLE SODIUM 40 MG IV SOLR
40.0000 mg | Freq: Two times a day (BID) | INTRAVENOUS | Status: DC
  Administered 2019-08-19 – 2019-08-20 (×4): 40 mg via INTRAVENOUS
  Filled 2019-08-19 (×4): qty 40

## 2019-08-19 MED ORDER — FUROSEMIDE 10 MG/ML IJ SOLN
80.0000 mg | Freq: Once | INTRAMUSCULAR | Status: AC
  Administered 2019-08-19: 10:00:00 80 mg via INTRAVENOUS
  Filled 2019-08-19: qty 8

## 2019-08-19 MED ORDER — INSULIN ASPART 100 UNIT/ML ~~LOC~~ SOLN
0.0000 [IU] | SUBCUTANEOUS | Status: DC
  Administered 2019-08-19 (×2): 1 [IU] via SUBCUTANEOUS
  Administered 2019-08-19 (×2): 2 [IU] via SUBCUTANEOUS
  Administered 2019-08-23: 1 [IU] via SUBCUTANEOUS
  Administered 2019-08-23: 2 [IU] via SUBCUTANEOUS
  Administered 2019-08-24 – 2019-08-25 (×5): 1 [IU] via SUBCUTANEOUS
  Administered 2019-08-25: 2 [IU] via SUBCUTANEOUS
  Administered 2019-08-25 – 2019-08-27 (×12): 1 [IU] via SUBCUTANEOUS
  Administered 2019-08-27: 2 [IU] via SUBCUTANEOUS
  Administered 2019-08-27 – 2019-08-28 (×5): 1 [IU] via SUBCUTANEOUS
  Administered 2019-08-30 (×2): 2 [IU] via SUBCUTANEOUS

## 2019-08-19 MED ORDER — DEXTROSE 50 % IV SOLN
INTRAVENOUS | Status: AC
  Administered 2019-08-19: 09:00:00 50 mL
  Filled 2019-08-19: qty 50

## 2019-08-19 MED ORDER — CALCIUM GLUCONATE-NACL 1-0.675 GM/50ML-% IV SOLN
1.0000 g | Freq: Once | INTRAVENOUS | Status: AC
  Administered 2019-08-19: 1000 mg via INTRAVENOUS
  Filled 2019-08-19: qty 50

## 2019-08-19 MED ORDER — POTASSIUM CHLORIDE 20 MEQ/15ML (10%) PO SOLN
40.0000 meq | Freq: Once | ORAL | Status: AC
  Administered 2019-08-19: 40 meq via ORAL
  Filled 2019-08-19: qty 30

## 2019-08-19 MED ORDER — ACETAMINOPHEN 325 MG PO TABS
650.0000 mg | ORAL_TABLET | Freq: Four times a day (QID) | ORAL | Status: DC | PRN
  Administered 2019-08-19: 650 mg via ORAL
  Filled 2019-08-19: qty 2

## 2019-08-19 MED ORDER — VANCOMYCIN HCL 1250 MG/250ML IV SOLN
1250.0000 mg | Freq: Once | INTRAVENOUS | Status: AC
  Administered 2019-08-19: 1250 mg via INTRAVENOUS
  Filled 2019-08-19: qty 250

## 2019-08-19 NOTE — Progress Notes (Signed)
eLink Physician-Brief Progress Note Patient Name: Philip Wells DOB: 09-28-1955 MRN: [id number]   Date of Service  08/19/2019  HPI/Events of Note  Hypocalcemia - Ca++ = 6.4 which corrects to 8.72 (slightly low) given albumin = 1.1.   eICU Interventions  Will replace Ca++.     Intervention Category Major Interventions: Electrolyte abnormality - evaluation and management  Ieisha Gao Eugene 08/19/2019, 6:39 AM

## 2019-08-19 NOTE — Progress Notes (Signed)
RT was called to patient room due to patient's sats reading in the 70s. Upon arrival, FIO2 increased to 100%.  Noted that patient's peak pressures on ventilator were reading in the high 30s-40s.  Attempted to lavage patient only to obtain a moderate amount of pink tinged, frothy, secretions.  Patient's sats still reading in 70s.  RT obtained ABG to assess sats for correlation.  Left patient's FIO2 on 100% due to sats only reading 91% on ABG results.  Will continue to monitor.    Ref. Range 08/19/2019 07:50  Sample type Unknown ARTERIAL  pH, Arterial Latest Ref Range: 7.350 - 7.450  7.410  pCO2 arterial Latest Ref Range: 32.0 - 48.0 mmHg 44.5  pO2, Arterial Latest Ref Range: 83.0 - 108.0 mmHg 60.0 (L)  TCO2 Latest Ref Range: 22 - 32 mmol/L 30  Acid-Base Excess Latest Ref Range: 0.0 - 2.0 mmol/L 3.0 (H)  Bicarbonate Latest Ref Range: 20.0 - 28.0 mmol/L 28.2 (H)  O2 Saturation Latest Units: % 91.0  Patient temperature Unknown 36.9 C  Collection site Unknown ARTERIAL LINE

## 2019-08-19 NOTE — Progress Notes (Signed)
NAME:  Philip Wells, MRN:  [id number], DOB:  [birth year], LOS: 3 ADMISSION DATE:  08/19/2019, CONSULTATION DATE:  08/20/2019 REFERRING MD:  ED - [facility], CHIEF COMPLAINT:  Status post cardiac arrest.   Brief History   64 yoM presenting post cardiac arrest.  Little known but apparently found down, unknown down time, ROSC after 6 rounds of CPR, no shock advised.  Initial temp 33 celsius.  Since has remained unresponsive and progressively hypotensive complicated by afib with RVR, severe metabolic acidosis, and multiple metabolic derangements.    History of present illness   HPI obtained from sister at bedside, [name] [phone].  Patient's initial registration was different [employer].  Sister verifies patient's DOB of [birth year]. MRN [id number] is not the correct patient.   Per sister, patient is 32 year AA male with no known past medical history. NKDA, no known medications, non smoker, unknown drug history, and does drink wine. Patient lives with his girlfriend, [name] in [place].  Story is unclear, but he was found unresponsive, unclear time down.  CPR performed by Fire department, reportedly 6 rounds of CPR with non-shockable rhythm prior to [place] with [facility] airway placed.    In ER, patient remained unresponsive, initial temp 33 degree celsius, initially normotensive, hypoglycemia, and in atrial tachycardia.  King airway replaced by ETT in ER.    Initially in coded by EMS as possible STEMI, however on cardiology eval in ER, initial rhythm showing aflutter with ratge 140s and STE in V3-4 which had resolved on arrival to ER.  Placed on cardizem gtt for rate control.    Workup noted for WBC 41K, Hgb 13.8, platelets 476, K 5.6, CO2 8, BUN 34, sCr 4.69, corrected Ca 9.3, glucose 57, AST 395, ALT 103, BNP 1142, trop hs 40, lactic  >11, INR 2, neg ETOH, neg tylenol/ salicylate level, IRCV8/ flu neg, CXR with good placement of ETT/ OGT, and nonspecific right perihilar density in which  CT was recommended for followup.  CTH pending.  Patient treated D50, finishing 3L NS, and multiple bicarb pushes.  PCCM asked to admit.   Past Medical History  ETOH use- wine  Significant Hospital Events   N/A  Consults:  Cardiology - Patient R/O for STEMI  Procedures:  2/2 ETT >> 2/2 foley >> 2/2 lSC CVL >> 08/29/2019 left chest tube>> 2/3 left Chest tube #2 >> 08/20/2019 left radial A-line>>  Significant Diagnostic Tests:   2/2 CTH > on separate chart, needs merged. I'm told it is negative for hemorrhage.   2/3 Renal US > Increased echogenicity consistent with medical renal disease  2/3 Echocardiogram> normal EF and Grade 2 diastolic dysfunction.  2/4 EEG shows diffuse encephalopathy but no seizures  Micro Data:  2/2 SARS 2 / Flu A/b >> neg 2/2 BCx 2 >> 2/2 UC >> 2/2 trach asp >> mod GPC >>>  Antimicrobials:  2/2 vanc >> 2/2 cefepime >>  Interim history/subjective:  Weaning pressors. Off Giapreza.  Objective   Blood pressure 126/87, pulse (!) 58, temperature 98.6 F (37 C), resp. rate 16, height 5' 11"  (1.803 m), weight 80.5 kg, SpO2 92 %. CVP:  [14 mmHg-17 mmHg] 16 mmHg  Vent Mode: PRVC FiO2 (%):  [40 %-60 %] 60 % Set Rate:  [16 bmp-24 bmp] 16 bmp Vt Set:  [600 mL] 600 mL PEEP:  [5 cmH20] 5 cmH20 Plateau Pressure:  [26 cmH20-34 cmH20] 29 cmH20   Intake/Output Summary (Last 24 hours) at 08/19/2019 0820 Last data filed at 08/19/2019  0600 Gross per 24 hour  Intake 7036.49 ml  Output 872 ml  Net 6164.49 ml   Filed Weights   09/10/2019 2000 08/17/19 0500 08/19/19 0500  Weight: 57.7 kg 62.3 kg 80.5 kg    Examination:  General: Elderly male of normal body habitus on vent.  HEENT: [place]/AT, PERRL, no JVD Neuro: Deeply sedated. RN tells me when he wakes he will posture vs reach for tube.  CV: RRR, no MRG.  PULM: Scattered rhonchi GI: soft, bsx4 active  GU: Foley draining scant clear yellow urine Extremities: warm/dry,  edema  Skin: no rashes or  lesions   Resolved Hospital Problem list   Cardiac arrest   Assessment & Plan:   Cardiac Arrest - unclear etiology. Doesn't appear to be primary cardiac or PE given relatively normal TTE. Some questions of whether he was moving purposefully yesterday before requiring deep sedation.  - DNR if arrests  Critically ill due to undifferentiated shock: Questionably septic shock given GPC on tracheal aspirate, leukocytosis, and PCT 40. Echo with LVEF 50% and no improvement with PTC reexpansion.  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 Did require sedation to control agitation.  - Sedation vacation.  - prognosis for meaningful neurological recovery is guarded given degree of cerebral atrophy.  Critically ill due to acute hypoxemic respiratory failure requiring mechanical ventilation - Continue full ventilatory support.  Left sided hydropneumothorax: CT placed 2/2 (With 1L fluid drained), worsening PTX on 2/3 so additional tube placed.  No residual on today CXR - Continue left chest tube to suction - Flush/Wean per protocol  Critically ill due New onset Afib with RVR: converted with amio - Continue amiodarone - Monitor electrolytes  AKI minimal UOP, seems volume up on exam (8L pos) Hypomag Hypocalcemia (corrects to near normal) - Stop bicarb infusion. - Follow BMP - Supp Mag - Consider lasix infusiononce   Possible UGIB  - Switch to BID Protonix.  Mild Transaminitis- ? Shock vs ETOH hx - improving. - Continue to monitor LFT and coagulations   Daily Goals Checklist  Pain/Anxiety/Delirium protocol (if indicated): Sedation interruption. Prn only VAP protocol (if indicated): bundle in place. Respiratory support goals: continue full ventilatory support. Blood pressure target: Keep MAP>65 DVT prophylaxis: BID UFH Nutrition Status: Nutrition Problem: Moderate Malnutrition Etiology: chronic illness,  social / environmental circumstances Signs/Symptoms: mild fat depletion, moderate muscle depletion Interventions: Tube feeding GI prophylaxis: Protonix bid Fluid status goals: attempt diuresis if vasopressor requirement continues to improve. Urinary catheter: Guide hemodynamic management Glucose control: episodes of hypoglycemia Mobility/therapy needs: bed rest Antibiotic de-escalation: continue current antibiotics for 7 days Home medication reconciliation: none relevant. Daily labs: CMP. CBC Code Status: DNR Family Communication:  Sister updated yesterday.  Disposition: ICU   Labs   CBC: Recent Labs  Lab 08/30/2019 1543 08/20/2019 1554 09/10/2019 2347 08/17/19 1025 08/17/19 2015 08/17/19 2017 08/18/19 0457 08/18/19 2333 08/19/19 0454 08/19/19 0500 08/19/19 0750  WBC 40.9*  --  20.7*  --  16.4*  --  13.2*  --   --  13.1*  --   NEUTROABS 37.6*  --   --   --  15.7*  --   --   --   --  11.5*  --   HGB 13.8   < > 14.3   < > 13.5   < > 11.9* 11.6* 11.9* 11.0* 9.9*  HCT 46.2   < > 43.3   < > 41.0   < >  35.8* 34.0* 35.0* 32.1* 29.0*  MCV 109.7*  --  98.2  --  98.8  --  96.2  --   --  93.6  --   PLT 476*  --  416*  --  337  --  277  --   --  239  --    < > = values in this interval not displayed.    Basic Metabolic Panel: Recent Labs  Lab 08/22/2019 2347 08/22/2019 2347 08/17/19 0714 08/17/19 1025 08/18/19 0012 08/18/19 0012 08/18/19 0457 08/18/19 1630 08/18/19 2333 08/19/19 0454 08/19/19 0500 08/19/19 0750  NA 135   < > 139   < > 144   < > 140  --  141 140 140 144  K 4.2   < > 4.1   < > 3.7   < > 4.2  --  3.9 3.6 3.8 3.3*  CL 103  --  105  --  112*  --  108  --   --   --  101  --   CO2 15*  --  18*  --  16*  --  19*  --   --   --  30  --   GLUCOSE 204*  --  128*  --  99  --  169*  --   --   --  188*  --   BUN 37*  --  38*  --  36*  --  39*  --   --   --  43*  --   CREATININE 3.38*  --  2.70*  --  2.29*  --  2.38*  --   --   --  2.49*  --   CALCIUM 7.2*  --  6.7*  --  6.4*   --  6.1*  --   --   --  6.4*  --   MG  --   --   --   --   --   --  1.6* 2.3  --   --  1.9  --   PHOS  --   --   --   --   --   --  4.0 2.9  --   --  3.2  --    < > = values in this interval not displayed.   GFR: Estimated Creatinine Clearance: 31.9 mL/min (A) (by C-G formula based on SCr of 2.49 mg/dL (H)). Recent Labs  Lab 08/31/2019 1543 08/24/2019 1544 09/11/2019 1744 09/06/2019 2347 08/17/19 1000 08/17/19 2015 08/17/19 2035 08/17/19 2315 08/18/19 0457 08/19/19 0500  PROCALCITON  --   --   --   --  72.79  --   --   --  41.05 38.90  WBC   < >  --   --  20.7*  --  16.4*  --   --  13.2* 13.1*  LATICACIDVEN  --  >11.0* >11.0*  --   --   --  6.5* 8.9*  --   --    < > = values in this interval not displayed.    Liver Function Tests: Recent Labs  Lab 09/07/2019 1543 08/18/19 0457 08/19/19 0500  AST 395* 941* 653*  ALT 103* 226* 248*  ALKPHOS 80 41 57  BILITOT 1.1 1.1 0.7  PROT 7.2 4.1* 4.1*  ALBUMIN 2.2* 1.1* 1.1*   Recent Labs  Lab 08/20/2019 1543  LIPASE 19   No results for input(s): AMMONIA in the last 168 hours.  ABG    Component Value Date/Time  PHART 7.410 08/19/2019 0750   PCO2ART 44.5 08/19/2019 0750   PO2ART 60.0 (L) 08/19/2019 0750   HCO3 28.2 (H) 08/19/2019 0750   TCO2 30 08/19/2019 0750   ACIDBASEDEF 5.0 (H) 08/17/2019 2152   O2SAT 91.0 08/19/2019 0750     Coagulation Profile: Recent Labs  Lab 08/23/2019 1543 08/30/2019 2347  INR 2.0* 2.1*    Cardiac Enzymes: No results for input(s): CKTOTAL, CKMB, CKMBINDEX, TROPONINI in the last 168 hours.  HbA1C: Hgb A1c MFr Bld  Date/Time Value Ref Range Status  08/19/2019 01:16 AM 5.3 4.8 - 5.6 % Final    Comment:    (NOTE) Pre diabetes:          5.7%-6.4% Diabetes:              >6.4% Glycemic control for   <7.0% adults with diabetes     CBG: Recent Labs  Lab 08/18/19 1117 08/18/19 1618 08/19/19 0046 08/19/19 0452 08/19/19 0755  GLUCAP 210* 230* 189* 189* 53*   CRITICAL CARE Performed by:  Kipp Brood   Total critical care time: 45 minutes  Critical care time was exclusive of separately billable procedures and treating other patients.  Critical care was necessary to treat or prevent imminent or life-threatening deterioration.  Critical care was time spent personally by me on the following activities: development of treatment plan with patient and/or surrogate as well as nursing, discussions with consultants, evaluation of patient's response to treatment, examination of patient, obtaining history from patient or surrogate, ordering and performing treatments and interventions, ordering and review of laboratory studies, ordering and review of radiographic studies, pulse oximetry, re-evaluation of patient's condition and participation in multidisciplinary rounds.  Kipp Brood, MD [facility] ICU Physician [place]  Pager: [phone] Mobile: [phone] After hours: [phone].    08/19/2019 8:20 AM

## 2019-08-19 NOTE — Progress Notes (Signed)
RT called to patient room due to patient having a drop in sats.  Upon arrival patient had a sat of 89%, on FIO2 of 100%.  Per MD, chest xray showed mucus plug on right side.  Ordered to start CPT on patient and lavage.  Took patient off of ventilator and bag lavaged patient and was able to obtain a small amount of thick, yellow/tan plugs.  Sats improved to 97%.  Placed patient back on ventilator and increased PEEP to 8.  Sats currently maintaining at 93%.  Will continue to monitor.

## 2019-08-19 NOTE — Procedures (Signed)
Central Venous Catheter Insertion Procedure Note Philip Wells [facility] [id number] [birth year]  Procedure: Insertion of Central Venous Catheter Indications: Assessment of intravascular volume and Drug and/or fluid administration  Procedure Details Consent: Unable to obtain consent because of emergent medical necessity. Time Out: Verified patient identification, verified procedure, site/side was marked, verified correct patient position, special equipment/implants available, medications/allergies/relevent history reviewed, required imaging and test results available.  Performed  Maximum sterile technique was used including antiseptics, cap, gloves, gown, hand hygiene, mask and sheet. Skin prep: Chlorhexidine; local anesthetic administered A antimicrobial bonded/coated triple lumen catheter was placed in the left subclavian vein using the Seldinger technique under ultrasound guidance.   Evaluation Blood flow good Complications: No apparent complications Patient did tolerate procedure well. Chest X-ray ordered to verify placement.  CXR: pending.  Philip Wells 08/19/2019, 9:11 AM

## 2019-08-19 NOTE — Progress Notes (Signed)
CRITICAL VALUE ALERT  Critical Value:  Calcium 6.4  Date & Time Notied:  08/19/19 0631  Provider Notified: Elink/Sommer MD  Orders Received/Actions taken: replace-see new orders  Malva Limes RN

## 2019-08-19 NOTE — Progress Notes (Signed)
eLink Physician-Brief Progress Note Patient Name: Philip Wells DOB: Dec 15, 1955 MRN: [id number]   Date of Service  08/19/2019  HPI/Events of Note  Hyperglycemia - Blood glucose = 230.   eICU Interventions  Will order: 1. Q 4 hour sensitive Novolog SSI.     Intervention Category Major Interventions: Hyperglycemia - active titration of insulin therapy  Jerell Demery Eugene 08/19/2019, 1:16 AM

## 2019-08-19 NOTE — Progress Notes (Signed)
eLink Physician-Brief Progress Note Patient Name: Philip Wells DOB: [birth year] MRN: [id number]   Date of Service  08/19/2019  HPI/Events of Note  Multiple issues: 1. ABG on 40%/PRVC 24/TVV 600/P 5 = 7.57/32.1/57.0 and 2. ICa++ = 0.87.  eICU Interventions  Will order: 1. Decrease PRVC rate to 16.  2. Repeat ABG at 5 AM. 3. Replace Ca++.     Intervention Category Major Interventions: Respiratory failure - evaluation and management;Electrolyte abnormality - evaluation and management  Aryaa Bunting Eugene 08/19/2019, 12:12 AM

## 2019-08-20 ENCOUNTER — Inpatient Hospital Stay (HOSPITAL_COMMUNITY): Payer: Self-pay

## 2019-08-20 LAB — CBC WITH DIFFERENTIAL/PLATELET
Abs Immature Granulocytes: 0.26 10*3/uL — ABNORMAL HIGH (ref 0.00–0.07)
Basophils Absolute: 0.1 10*3/uL (ref 0.0–0.1)
Basophils Relative: 1 %
Eosinophils Absolute: 0 10*3/uL (ref 0.0–0.5)
Eosinophils Relative: 0 %
HCT: 33.6 % — ABNORMAL LOW (ref 39.0–52.0)
Hemoglobin: 11.4 g/dL — ABNORMAL LOW (ref 13.0–17.0)
Immature Granulocytes: 2 %
Lymphocytes Relative: 5 %
Lymphs Abs: 0.8 10*3/uL (ref 0.7–4.0)
MCH: 32.7 pg (ref 26.0–34.0)
MCHC: 33.9 g/dL (ref 30.0–36.0)
MCV: 96.3 fL (ref 80.0–100.0)
Monocytes Absolute: 2.1 10*3/uL — ABNORMAL HIGH (ref 0.1–1.0)
Monocytes Relative: 13 %
Neutro Abs: 13.4 10*3/uL — ABNORMAL HIGH (ref 1.7–7.7)
Neutrophils Relative %: 79 %
Platelets: 201 10*3/uL (ref 150–400)
RBC: 3.49 MIL/uL — ABNORMAL LOW (ref 4.22–5.81)
RDW: 13.6 % (ref 11.5–15.5)
WBC: 16.7 10*3/uL — ABNORMAL HIGH (ref 4.0–10.5)
nRBC: 3.5 % — ABNORMAL HIGH (ref 0.0–0.2)

## 2019-08-20 LAB — COMPREHENSIVE METABOLIC PANEL
ALT: 224 U/L — ABNORMAL HIGH (ref 0–44)
AST: 499 U/L — ABNORMAL HIGH (ref 15–41)
Albumin: 1.2 g/dL — ABNORMAL LOW (ref 3.5–5.0)
Alkaline Phosphatase: 98 U/L (ref 38–126)
Anion gap: 11 (ref 5–15)
BUN: 47 mg/dL — ABNORMAL HIGH (ref 8–23)
CO2: 29 mmol/L (ref 22–32)
Calcium: 6.8 mg/dL — ABNORMAL LOW (ref 8.9–10.3)
Chloride: 101 mmol/L (ref 98–111)
Creatinine, Ser: 2.62 mg/dL — ABNORMAL HIGH (ref 0.61–1.24)
GFR calc Af Amer: 29 mL/min — ABNORMAL LOW (ref >60)
GFR calc non Af Amer: 25 mL/min — ABNORMAL LOW (ref >60)
Glucose, Bld: 126 mg/dL — ABNORMAL HIGH (ref 70–99)
Potassium: 4.2 mmol/L (ref 3.5–5.1)
Sodium: 141 mmol/L (ref 135–145)
Total Bilirubin: 1.5 mg/dL — ABNORMAL HIGH (ref 0.3–1.2)
Total Protein: 4.8 g/dL — ABNORMAL LOW (ref 6.5–8.1)

## 2019-08-20 LAB — GLUCOSE, CAPILLARY
Glucose-Capillary: 101 mg/dL — ABNORMAL HIGH (ref 70–99)
Glucose-Capillary: 107 mg/dL — ABNORMAL HIGH (ref 70–99)
Glucose-Capillary: 109 mg/dL — ABNORMAL HIGH (ref 70–99)
Glucose-Capillary: 110 mg/dL — ABNORMAL HIGH (ref 70–99)
Glucose-Capillary: 113 mg/dL — ABNORMAL HIGH (ref 70–99)
Glucose-Capillary: 119 mg/dL — ABNORMAL HIGH (ref 70–99)
Glucose-Capillary: 17 mg/dL — CL (ref 70–99)
Glucose-Capillary: 31 mg/dL — CL (ref 70–99)
Glucose-Capillary: 35 mg/dL — CL (ref 70–99)
Glucose-Capillary: 45 mg/dL — ABNORMAL LOW (ref 70–99)
Glucose-Capillary: 82 mg/dL (ref 70–99)
Glucose-Capillary: 97 mg/dL (ref 70–99)

## 2019-08-20 MED ORDER — DEXTROSE 50 % IV SOLN: 25.0000 g | INTRAVENOUS | Status: AC

## 2019-08-20 MED ORDER — DEXTROSE 50 % IV SOLN
INTRAVENOUS | Status: AC
  Administered 2019-08-20: 25 mL
  Filled 2019-08-20: qty 50

## 2019-08-20 MED ORDER — DEXTROSE 50 % IV SOLN
25.0000 mL | Freq: Once | INTRAVENOUS | Status: AC
  Administered 2019-08-20: 25 mL via INTRAVENOUS

## 2019-08-20 MED ORDER — QUETIAPINE FUMARATE 50 MG PO TABS
50.0000 mg | ORAL_TABLET | Freq: Two times a day (BID) | ORAL | Status: DC
  Administered 2019-08-20 – 2019-08-24 (×8): 50 mg
  Filled 2019-08-20 (×8): qty 1

## 2019-08-20 MED ORDER — AMIODARONE HCL 200 MG PO TABS
200.0000 mg | ORAL_TABLET | Freq: Every day | ORAL | Status: DC
  Administered 2019-08-20 – 2019-08-26 (×7): 200 mg via ORAL
  Filled 2019-08-20 (×7): qty 1

## 2019-08-20 MED ORDER — SODIUM CHLORIDE 0.9 % IV SOLN
2.0000 g | Freq: Two times a day (BID) | INTRAVENOUS | Status: DC
  Administered 2019-08-20 – 2019-08-22 (×5): 2 g via INTRAVENOUS
  Filled 2019-08-20 (×6): qty 2

## 2019-08-20 MED ORDER — DEXTROSE 50 % IV SOLN
INTRAVENOUS | Status: AC
  Administered 2019-08-20: 25 g via INTRAVENOUS
  Filled 2019-08-20: qty 50

## 2019-08-20 MED ORDER — FUROSEMIDE 10 MG/ML IJ SOLN
100.0000 mg | Freq: Two times a day (BID) | INTRAVENOUS | Status: DC
  Administered 2019-08-20 – 2019-08-24 (×9): 100 mg via INTRAVENOUS
  Filled 2019-08-20 (×11): qty 10

## 2019-08-20 MED ORDER — HALOPERIDOL LACTATE 5 MG/ML IJ SOLN
INTRAMUSCULAR | Status: AC
  Filled 2019-08-20: qty 1

## 2019-08-20 MED ORDER — HALOPERIDOL LACTATE 5 MG/ML IJ SOLN
5.0000 mg | Freq: Once | INTRAMUSCULAR | Status: AC
  Administered 2019-08-20: 5 mg via INTRAVENOUS

## 2019-08-20 MED ORDER — DEXTROSE 10 % IV SOLN: INTRAVENOUS | Status: DC

## 2019-08-20 NOTE — Progress Notes (Signed)
eLink Physician-Brief Progress Note Patient Name: Philip Wells DOB: August 30, 1955 MRN: [id number]   Date of Service  08/20/2019  HPI/Events of Note  Notified of leak in arterial line and there appears to be a hole on the catheter where it was kinked.  eICU Interventions  RT to insert a new aline as patient is still on 2 pressors     Intervention Category Intermediate Interventions: Other:  [name] 08/20/2019, 4:02 AM

## 2019-08-20 NOTE — Progress Notes (Signed)
Spoke with MD in regards to Aline order.  Nightshift RT tried multiple times and was unsuccessful.  MD said he would f/u with family in regards to goals of care.  RT will continue to monitor.

## 2019-08-20 NOTE — Progress Notes (Signed)
RT attempted Aline with no success, times 2. RN notified.

## 2019-08-20 NOTE — Progress Notes (Signed)
Called by RN to assess Philip Wells again. Site was leaking and waveform wasn't showing up on monitor. Flushed Philip Wells and fluids leaked out side of kinked catheter. Philip Wells removed due to small hole and pressure held. MD notified.

## 2019-08-20 NOTE — Progress Notes (Signed)
Called by RN to redress Philip Wells due to bleeding and loosening of securing device from patient slinging of arms. Philip Wells redressed and secured.

## 2019-08-20 NOTE — Progress Notes (Signed)
Pharmacy Antibiotic Note  Philip Wells is a 65 y.o. male admitted on 08/15/2019 with post-cardiac arrest. Pharmacy consulted to dose cefepime for serratia pneumonia. WBC= 13>16.7, PCT= 40. Afeb. Vancomycin off yesterday. SCr increased to 2.62 (est CrCl 31 mL/min)  Blood cultures 2/3 ngtd. Sputum culture 2/2 with serratia - R cefazolin MRSA negative  Plan: -Increase cefepime to 2 g IV q12h due to CrCl >30 -Cefepime x 7 days total per CCM   Height: 5\' 11"  (180.3 cm) Weight: 168 lb 14 oz (76.6 kg) IBW/kg (Calculated) : 75.3  Temp (24hrs), Avg:98.4 F (36.9 C), Min:97.5 F (36.4 C), Max:99.5 F (37.5 C)  Recent Labs  Lab 09/07/2019 1543 09/01/2019 1544 08/15/2019 1554 09/05/2019 1744 08/17/2019 2347 09/06/2019 2347 08/17/19 0714 08/17/19 2015 08/17/19 2035 08/17/19 2315 08/18/19 0012 08/18/19 0457 08/19/19 0500 08/20/19 0322  WBC   < >  --   --   --  20.7*  --   --  16.4*  --   --   --  13.2* 13.1* 16.7*  CREATININE   < >  --    < >  --  3.38*   < > 2.70*  --   --   --  2.29* 2.38* 2.49* 2.62*  LATICACIDVEN  --  >11.0*  --  >11.0*  --   --   --   --  6.5* 8.9*  --   --   --   --   VANCORANDOM  --   --   --   --   --   --   --   --   --  4  --   --  14  --    < > = values in this interval not displayed.    Estimated Creatinine Clearance: 30.3 mL/min (A) (by C-G formula based on SCr of 2.62 mg/dL (H)).    No Known Allergies  Antimicrobials this admission: Cefepime 2/2 >>  Vancomycin 2/2 >> 2/5  Thanks for allowing pharmacy to be a part of this patient's care.  10/18/19, PharmD, BCPS PGY2 Cardiology Pharmacy Resident Phone [phone] 08/20/2019       9:16 AM  Please check [email or web address] for unit-specific pharmacist phone numbers

## 2019-08-20 NOTE — Progress Notes (Addendum)
Patient pulled ett and og tubes out approximately 5 cm. RT replaced and chest xray ordered. Patient has milky white saliva and a taut abdomen so tube feed held and gastric residual was . Dr. Denese Killings paged. Soft restraints ordered and implemented.

## 2019-08-20 NOTE — Progress Notes (Addendum)
Patient BG 14 at 0800. Administered 50 dextrose per protocol

## 2019-08-20 NOTE — Progress Notes (Signed)
Aline very positional more so than before and more oozing at site. Aline redressed and secured. Waveform acceptable at this time.

## 2019-08-20 NOTE — Progress Notes (Signed)
eLink Physician-Brief Progress Note Patient Name: Philip Wells DOB: 18-Aug-1955 MRN: [id number]   Date of Service  08/20/2019  HPI/Events of Note  Recurrent hypoglycemia (POCT glucose as low as 31) in this intubated patient who does not have tube feeds at present. He was previously on tube feeds but these were held for high residuals (and he was hypoglycemic even on tube feeds raising possibility of impaired absorption).   eICU Interventions  Start D10W drip at 30cc/hr to provide dextrose source to prevent hypoglycemia, while minimizing additional volume. Continue to monitor POCT glucose closely and adjust D10 drip rate as needed to maintain euglycemic state.     Intervention Category Intermediate Interventions: Other:  Janae Bridgeman 08/20/2019, 8:08 PM

## 2019-08-20 NOTE — Progress Notes (Signed)
NAME:  Philip Wells, MRN:  [id number], DOB:  Nov 26, 1955, LOS: 4 ADMISSION DATE:  08/17/2019, CONSULTATION DATE:  08/15/2019 REFERRING MD:  ED - [facility], CHIEF COMPLAINT:  Status post cardiac arrest.   Brief History   [name] with PEA cardiac arrest and subsequent distributive shock, presumably due to sepsis.  History of present illness   HPI obtained from sister at bedside, Philip Wells [phone].  Patient's initial registration was different [employer].  Sister verifies patient's DOB of 31-May-1956. MRN [id number] is not the correct patient.   Per sister, patient is 87 year AA male with no known past medical history. NKDA, no known medications, non smoker, unknown drug history, and does drink wine. Patient lives with his girlfriend, Philip Wells in [place].  Story is unclear, but he was found unresponsive, unclear time down.  CPR performed by Fire department, reportedly 6 rounds of CPR with non-shockable rhythm prior to [place] with [facility] airway placed.    In ER, patient remained unresponsive, initial temp 33 degree celsius, initially normotensive, hypoglycemia, and in atrial tachycardia.  [facility] airway replaced by ETT in ER.    Initially in coded by EMS as possible STEMI, however on cardiology eval in ER, initial rhythm showing aflutter with ratge 140s and STE in V3-4 which had resolved on arrival to ER.  Placed on cardizem gtt for rate control.    Workup noted for WBC 41K, Hgb 13.8, platelets 476, K 5.6, CO2 8, BUN 34, sCr 4.69, corrected Ca 9.3, glucose 57, AST 395, ALT 103, BNP 1142, trop hs 40, lactic  >11, INR 2, neg ETOH, neg tylenol/ salicylate level, CZYS0/ flu neg, CXR with good placement of ETT/ OGT, and nonspecific right perihilar density in which CT was recommended for followup.  CTH pending.  Patient treated D50, finishing 3L NS, and multiple bicarb pushes.  PCCM asked to admit.   Past Medical History  ETOH use- wine  Significant Hospital Events   2/2 admitted  with undifferentiated shock following cardiac arrest 2/4 continued pressor requirements.  Completed 36 degree TTM 2/6 more awake today.  Weaning pressor requirements.  Consults:  Cardiology - Patient R/O for STEMI  Procedures:  2/2 ETT >> 2/2 foley >> 2/2 LSC CVL >> replaced 2/5 09/09/2019 left chest tube>> 2/3 left Chest tube #2 >> 08/20/2019 left radial A-line>>  Significant Diagnostic Tests:   2/2 CTH >d severe atrophy but no hemorrhage.  2/3 Renal US > Increased echogenicity consistent with medical renal disease  2/3 Echocardiogram> normal EF and Grade 2 diastolic dysfunction.  2/4 EEG shows diffuse encephalopathy but no seizures  2/6 CXR (personally reviewed) right lower lobe chest infiltrate.  Small residual left-sided pneumothorax.  Micro Data:  2/2 SARS 2 / Flu A/b >> neg 2/2 BCx 2 >> 2/2 UC >> 2/2 trach asp >> moderate Serratia marcescens  Antimicrobials:  2/2 vanc >> 2/2 cefepime >>  Interim history/subjective:  Continues to wean off vasopressors.  More awake today but not following commands.  Objective   Blood pressure 126/65, pulse 96, temperature 98.6 F (37 C), temperature source Oral, resp. rate 14, height 5' 11"  (1.803 m), weight 76.6 kg, SpO2 98 %. CVP:  [14 mmHg-20 mmHg] 20 mmHg  Vent Mode: PRVC FiO2 (%):  [70 %-100 %] 70 % Set Rate:  [16 bmp] 16 bmp Vt Set:  [600 mL] 600 mL PEEP:  [8 cmH20] 8 cmH20 Plateau Pressure:  [22 cmH20-35 cmH20] 22 cmH20   Intake/Output Summary (Last 24 hours) at 08/20/2019  1052 Last data filed at 08/20/2019 1000 Gross per 24 hour  Intake 2592.48 ml  Output 1195 ml  Net 1397.48 ml   Filed Weights   08/17/19 0500 08/19/19 0500 08/20/19 0500  Weight: 62.3 kg 80.5 kg 76.6 kg    Examination:  General: Elderly male of normal body habitus on vent.  HEENT: ET tube OG tube in place with no skin breakdown Neuro: On fentanyl infusion.  Periodically agitated moving his upper extremities.  Not purposeful not following  commands. CV: Heart sounds are unremarkable. PULM: Chest clear to auscultation no ventilator dyssynchrony. GI: soft, bsx4 active  GU: Foley draining scant clear yellow urine Extremities: warm/dry, increasing peripheral edema. Skin: no rashes or lesions   Resolved Hospital Problem list   Cardiac arrest   Assessment & Plan:   Cardiac Arrest - unclear etiology. Doesn't appear to be primary cardiac or PE given relatively normal TTE. Some questions of whether he was moving purposefully yesterday before requiring deep sedation.  - DNR if arrests  Critically ill due to undifferentiated shock: Questionably septic shock given GPC on tracheal aspirate, leukocytosis, and PCT 40. Echo with LVEF 50% and no improvement with PTC reexpansion.  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 Did require sedation to control agitation.  - prognosis for meaningful neurological recovery is guarded given degree of cerebral atrophy, however increasing level of agitation is encouraging. -Limit sedative infusions.  Critically ill due to acute hypoxemic respiratory failure requiring mechanical ventilation - Continue full ventilatory support. -We will start daily weaning trials.  Presumed Serratia marcescens pneumonia.  Likely cause of sepsis. -Complete course of antibiotics for 7 days.  Left sided hydropneumothorax: CT placed 2/2 (With 1L fluid drained), worsening PTX on 2/3 so additional tube placed.  No residual on today CXR - Continue left chest tube to suction - Flush/Wean per protocol  Critically ill due New onset Afib with RVR: converted with amio - Continue amiodarone - Monitor electrolytes  AKI minimal UOP, seems volume up on exam (8L pos) Hypomag Hypocalcemia (corrects to near normal) - Stop bicarb infusion. - Follow BMP - Supp Mag - Consider lasix infusiononce   Possible UGIB.  Hemoglobin has been stable -  Switch to BID Protonix.  Mild Transaminitis- ? Shock vs ETOH hx -continues to improve - Continue to monitor LFT and coagulations   Daily Goals Checklist  Pain/Anxiety/Delirium protocol (if indicated): Fentanyl infusion only, as needed Versed. VAP protocol (if indicated): bundle in place. Respiratory support goals: continue full ventilatory support. Blood pressure target: Keep MAP>65 DVT prophylaxis: BID UFH Nutrition Status: Nutrition Problem: Moderate Malnutrition Etiology: chronic illness, social / environmental circumstances Signs/Symptoms: mild fat depletion, moderate muscle depletion Interventions: Tube feeding GI prophylaxis: Protonix bid Fluid status goals: attempt diuresis if vasopressor requirement continues to improve. Urinary catheter: Guide hemodynamic management Glucose control: episodes of hypoglycemia Mobility/therapy needs: bed rest Antibiotic de-escalation: continue current antibiotics for 7 days Home medication reconciliation: none relevant. Daily labs: CMP. CBC Code Status: DNR Family Communication:  Sister updated yesterday.  Disposition: ICU   Labs   CBC: Recent Labs  Lab 08/25/2019 1543 08/28/2019 1554 08/20/2019 2347 08/17/19 1025 08/17/19 2015 08/17/19 2017 08/18/19 0457 08/18/19 0457 08/18/19 2333 08/19/19 0454 08/19/19 0500 08/19/19 0750 08/20/19 0322  WBC 40.9*   < > 20.7*  --  16.4*  --  13.2*  --   --   --  13.1*  --  16.7*  NEUTROABS 37.6*  --   --   --  15.7*  --   --   --   --   --  11.5*  --  13.4*  HGB 13.8   < > 14.3   < > 13.5   < > 11.9*   < > 11.6* 11.9* 11.0* 9.9* 11.4*  HCT 46.2   < > 43.3   < > 41.0   < > 35.8*   < > 34.0* 35.0* 32.1* 29.0* 33.6*  MCV 109.7*   < > 98.2  --  98.8  --  96.2  --   --   --  93.6  --  96.3  PLT 476*   < > 416*  --  337  --  277  --   --   --  239  --  201   < > = values in this interval not displayed.    Basic Metabolic Panel: Recent Labs  Lab 08/17/19 0714 08/17/19 1025 08/18/19 0012  08/18/19 0012 08/18/19 0457 08/18/19 0457 08/18/19 1630 08/18/19 2333 08/19/19 0454 08/19/19 0500 08/19/19 0750 08/19/19 1600 08/20/19 0322  NA 139   < > 144   < > 140   < >  --  141 140 140 144  --  141  K 4.1   < > 3.7   < > 4.2   < >  --  3.9 3.6 3.8 3.3*  --  4.2  CL 105  --  112*  --  108  --   --   --   --  101  --   --  101  CO2 18*  --  16*  --  19*  --   --   --   --  30  --   --  29  GLUCOSE 128*  --  99  --  169*  --   --   --   --  188*  --   --  126*  BUN 38*  --  36*  --  39*  --   --   --   --  43*  --   --  47*  CREATININE 2.70*  --  2.29*  --  2.38*  --   --   --   --  2.49*  --   --  2.62*  CALCIUM 6.7*  --  6.4*  --  6.1*  --   --   --   --  6.4*  --   --  6.8*  MG  --   --   --   --  1.6*  --  2.3  --   --  1.9  --  1.9  --   PHOS  --   --   --   --  4.0  --  2.9  --   --  3.2  --  4.4  --    < > = values in this interval not displayed.   GFR: Estimated Creatinine Clearance: 30.3 mL/min (A) (by C-G formula based on SCr of 2.62 mg/dL (H)). Recent Labs  Lab 08/29/2019 1544 08/17/2019 1744 09/09/2019 2347 08/17/19 1000 08/17/19 2015 08/17/19 2035 08/17/19 2315 08/18/19 0457 08/19/19 0500 08/20/19 0322  PROCALCITON  --   --   --  72.79  --   --   --  41.05 38.90  --   WBC  --   --    < >  --  16.4*  --   --  13.2* 13.1* 16.7*  LATICACIDVEN >11.0* >11.0*  --   --   --  6.5* 8.9*  --   --   --    < > = values in this interval not displayed.    Liver Function Tests: Recent Labs  Lab 09/04/2019 1543 08/18/19 0457 08/19/19 0500 08/20/19 0322  AST 395* 941* 653* 499*  ALT 103* 226* 248* 224*  ALKPHOS 80 41 57 98  BILITOT 1.1 1.1 0.7 1.5*  PROT 7.2 4.1* 4.1* 4.8*  ALBUMIN 2.2* 1.1* 1.1* 1.2*   Recent Labs  Lab 08/25/2019 1543  LIPASE 19   No results for input(s): AMMONIA in the last 168 hours.  ABG    Component Value Date/Time   PHART 7.410 08/19/2019 0750   PCO2ART 44.5 08/19/2019 0750   PO2ART 60.0 (L) 08/19/2019 0750   HCO3 28.2 (H) 08/19/2019  0750   TCO2 30 08/19/2019 0750   ACIDBASEDEF 5.0 (H) 08/17/2019 2152   O2SAT 91.0 08/19/2019 0750     Coagulation Profile: Recent Labs  Lab 08/20/2019 1543 08/31/2019 2347  INR 2.0* 2.1*    Cardiac Enzymes: No results for input(s): CKTOTAL, CKMB, CKMBINDEX, TROPONINI in the last 168 hours.  HbA1C: Hgb A1c MFr Bld  Date/Time Value Ref Range Status  08/19/2019 01:16 AM 5.3 4.8 - 5.6 % Final    Comment:    (NOTE) Pre diabetes:          5.7%-6.4% Diabetes:              >6.4% Glycemic control for   <7.0% adults with diabetes     CBG: Recent Labs  Lab 08/20/19 0319 08/20/19 0754 08/20/19 0756 08/20/19 0831 08/20/19 0927  GLUCAP 119* 14* 17* 45* 113*   CRITICAL CARE Performed by: Kipp Brood   Total critical care time: 45 minutes  Critical care time was exclusive of separately billable procedures and treating other patients.  Critical care was necessary to treat or prevent imminent or life-threatening deterioration.  Critical care was time spent personally by me on the following activities: development of treatment plan with patient and/or surrogate as well as nursing, discussions with consultants, evaluation of patient's response to treatment, examination of patient, obtaining history from patient or surrogate, ordering and performing treatments and interventions, ordering and review of laboratory studies, ordering and review of radiographic studies, pulse oximetry, re-evaluation of patient's condition and participation in multidisciplinary rounds.  Kipp Brood, MD [facility] ICU Physician [place]  Pager: [phone] Mobile: [phone] After hours: [phone].    08/20/2019 10:52 AM

## 2019-08-21 LAB — CBC WITH DIFFERENTIAL/PLATELET
Abs Immature Granulocytes: 0.22 10*3/uL — ABNORMAL HIGH (ref 0.00–0.07)
Basophils Absolute: 0.1 10*3/uL (ref 0.0–0.1)
Basophils Relative: 0 %
Eosinophils Absolute: 0 10*3/uL (ref 0.0–0.5)
Eosinophils Relative: 0 %
HCT: 29.3 % — ABNORMAL LOW (ref 39.0–52.0)
Hemoglobin: 10.2 g/dL — ABNORMAL LOW (ref 13.0–17.0)
Immature Granulocytes: 1 %
Lymphocytes Relative: 3 %
Lymphs Abs: 0.5 10*3/uL — ABNORMAL LOW (ref 0.7–4.0)
MCH: 32.2 pg (ref 26.0–34.0)
MCHC: 34.8 g/dL (ref 30.0–36.0)
MCV: 92.4 fL (ref 80.0–100.0)
Monocytes Absolute: 1.5 10*3/uL — ABNORMAL HIGH (ref 0.1–1.0)
Monocytes Relative: 8 %
Neutro Abs: 16.7 10*3/uL — ABNORMAL HIGH (ref 1.7–7.7)
Neutrophils Relative %: 88 %
Platelets: 144 10*3/uL — ABNORMAL LOW (ref 150–400)
RBC: 3.17 MIL/uL — ABNORMAL LOW (ref 4.22–5.81)
RDW: 13.5 % (ref 11.5–15.5)
WBC: 19 10*3/uL — ABNORMAL HIGH (ref 4.0–10.5)
nRBC: 1.2 % — ABNORMAL HIGH (ref 0.0–0.2)

## 2019-08-21 LAB — GLUCOSE, CAPILLARY
Glucose-Capillary: 73 mg/dL (ref 70–99)
Glucose-Capillary: 75 mg/dL (ref 70–99)
Glucose-Capillary: 76 mg/dL (ref 70–99)
Glucose-Capillary: 76 mg/dL (ref 70–99)
Glucose-Capillary: 83 mg/dL (ref 70–99)
Glucose-Capillary: 84 mg/dL (ref 70–99)

## 2019-08-21 MED ORDER — DEXMEDETOMIDINE HCL IN NACL 400 MCG/100ML IV SOLN
0.4000 ug/kg/h | INTRAVENOUS | Status: DC
  Administered 2019-08-21: 0.4 ug/kg/h via INTRAVENOUS
  Administered 2019-08-22: 0.2 ug/kg/h via INTRAVENOUS
  Filled 2019-08-21 (×2): qty 100

## 2019-08-21 MED ORDER — PANTOPRAZOLE SODIUM 40 MG PO PACK
40.0000 mg | PACK | Freq: Every day | ORAL | Status: DC
  Administered 2019-08-21 – 2019-08-28 (×8): 40 mg
  Filled 2019-08-21 (×9): qty 20

## 2019-08-21 NOTE — Progress Notes (Signed)
Patient peak pressuring on vent and desating. Lavaged and suctioned. Copious amounts of white frothy secretions. Increased FIO2 from 50 to 70%. Patient sats up to 94%. Will continue to monitor.

## 2019-08-21 NOTE — Progress Notes (Signed)
eLink Physician-Brief Progress Note Patient Name: Ifeoluwa Beller DOB: 1956/04/08 MRN: [id number]   Date of Service  08/21/2019  HPI/Events of Note  Request to renew restraints.  eICU Interventions  Order entered.     Intervention Category Minor Interventions: Other:  Janae Bridgeman 08/21/2019, 11:28 PM

## 2019-08-21 NOTE — Plan of Care (Signed)
Philip Wells has had labile BG values when checking via capillaries/finger sticks. The RN transitioned to CVP BG checks and was able to obtain more accurate values over the shift. See Neuro status has been without any changes, see Flowsheet. Weeping noted from the upper extremities. Chest tube dressing changed this shift. UO wnl. BP wnl. Afib noted on initial assessment at change of shift, but converted to SR for majority of the shift. Day shift RN given report and updated on POC.   Problem: Clinical Measurements: Goal: Cardiovascular complication will be avoided Outcome: Progressing   Problem: Safety: Goal: Ability to remain free from injury will improve Outcome: Progressing   Problem: Education: Goal: Knowledge of General Education information will improve Description: Including pain rating scale, medication(s)/side effects and non-pharmacologic comfort measures Outcome: Not Progressing   Problem: Health Behavior/Discharge Planning: Goal: Ability to manage health-related needs will improve Outcome: Not Progressing   Problem: Clinical Measurements: Goal: Ability to maintain clinical measurements within normal limits will improve Outcome: Not Progressing   Problem: Activity: Goal: Risk for activity intolerance will decrease Outcome: Not Progressing   Problem: Nutrition: Goal: Adequate nutrition will be maintained Outcome: Not Progressing   Problem: Coping: Goal: Level of anxiety will decrease Outcome: Not Progressing   Problem: Skin Integrity: Goal: Risk for impaired skin integrity will decrease Outcome: Not Progressing

## 2019-08-21 NOTE — Progress Notes (Addendum)
NAME:  Philip Wells, MRN:  [id number], DOB:  1955/12/17, LOS: 5 ADMISSION DATE:  09/11/2019, CONSULTATION DATE:  08/25/2019 REFERRING MD:  ED - [facility], CHIEF COMPLAINT:  Status post cardiac arrest.   Brief History   [name] with PEA cardiac arrest and subsequent distributive shock, presumably due to sepsis.  History of present illness   HPI obtained from sister at bedside, [name] [phone].  Patient's initial registration was different [employer].  Sister verifies patient's DOB of 1956-01-27. MRN [id number] is not the correct patient.   Per sister, patient is 72 year AA male with no known past medical history. NKDA, no known medications, non smoker, unknown drug history, and does drink wine. Patient lives with his girlfriend, [name] in [place].  Story is unclear, but he was found unresponsive, unclear time down.  CPR performed by Fire department, reportedly 6 rounds of CPR with non-shockable rhythm prior to [place] with [facility] airway placed.    In ER, patient remained unresponsive, initial temp 33 degree celsius, initially normotensive, hypoglycemia, and in atrial tachycardia.  King airway replaced by ETT in ER.    Initially in coded by EMS as possible STEMI, however on cardiology eval in ER, initial rhythm showing aflutter with ratge 140s and STE in V3-4 which had resolved on arrival to ER.  Placed on cardizem gtt for rate control.    Workup noted for WBC 41K, Hgb 13.8, platelets 476, K 5.6, CO2 8, BUN 34, sCr 4.69, corrected Ca 9.3, glucose 57, AST 395, ALT 103, BNP 1142, trop hs 40, lactic  >11, INR 2, neg ETOH, neg tylenol/ salicylate level, KVQQ5/ flu neg, CXR with good placement of ETT/ OGT, and nonspecific right perihilar density in which CT was recommended for followup.  CTH pending.  Patient treated D50, finishing 3L NS, and multiple bicarb pushes.  PCCM asked to admit.   Past Medical History  ETOH use- wine  Significant Hospital Events   2/2 admitted  with undifferentiated shock following cardiac arrest 2/4 continued pressor requirements.  Completed 36 degree TTM 2/6 more awake today.  Weaning pressor requirements.  Consults:  Cardiology - Patient R/O for STEMI  Procedures:  2/2 ETT >> 2/2 foley >> 2/2 LSC CVL >> replaced 2/5 08/18/2019 left chest tube>> 2/3 left Chest tube #2 >> 09/09/2019 left radial A-line>>  Significant Diagnostic Tests:   2/2 CTH >d severe atrophy but no hemorrhage.  2/3 Renal US > Increased echogenicity consistent with medical renal disease  2/3 Echocardiogram> normal EF and Grade 2 diastolic dysfunction.  2/4 EEG shows diffuse encephalopathy but no seizures  2/6 CXR (personally reviewed) right lower lobe chest infiltrate.  Small residual left-sided pneumothorax.  Micro Data:  2/2 SARS 2 / Flu A/b >> neg 2/2 BCx 2 >> 2/2 UC >> 2/2 trach asp >> moderate Serratia marcescens  Antimicrobials:  2/2 vanc >> 2/2 cefepime >>  Interim history/subjective:  Now off vasopressors.  Agitated off sedation but not following commands.  Apneic on SBT.  Objective   Blood pressure 113/63, pulse 69, temperature (!) 97.5 F (36.4 C), temperature source Oral, resp. rate 18, height 5' 11"  (1.803 m), weight 73.8 kg, SpO2 98 %. CVP:  [10 mmHg-13 mmHg] 10 mmHg  Vent Mode: PRVC FiO2 (%):  [50 %-60 %] 50 % Set Rate:  [16 bmp] 16 bmp Vt Set:  [600 mL] 600 mL PEEP:  [5 cmH20-8 cmH20] 5 cmH20 Plateau Pressure:  [24 cmH20-31 cmH20] 30 cmH20   Intake/Output Summary (Last  24 hours) at 08/21/2019 1232 Last data filed at 08/21/2019 1225 Gross per 24 hour  Intake 1063.66 ml  Output 2665 ml  Net -1601.34 ml   Filed Weights   08/20/19 0500 08/21/19 0030 08/21/19 0420  Weight: 76.6 kg 73.8 kg 73.8 kg    Examination:  General: Elderly male of normal body habitus on vent.  HEENT: ET tube OG tube in place with no skin breakdown Neuro: On fentanyl infusion.  Periodically agitated moving his upper extremities.  Semi-purposeful  but not following commands. CV: Heart sounds are unremarkable. PULM: no ventilator dyssynchrony.  Bronchial breathing at right base.  Otherwise normal vesicular breathing with no wheezes or rhonchi. GI: soft, bsx4 active  GU: Foley draining abundant clear urine Extremities: warm/dry, increasing peripheral edema. Skin: no rashes or lesions   Resolved Hospital Problem list   Cardiac arrest   Assessment & Plan:   Cardiac Arrest - unclear etiology. Doesn't appear to be primary cardiac or PE given relatively normal TTE. Some questions of whether he was moving purposefully yesterday before requiring deep sedation.  - DNR if rearrests  Was critically ill due to undifferentiated shock: Questionably septic shock given GPC on tracheal aspirate, leukocytosis, and PCT 40. Echo with LVEF 50% and no improvement with PTC reexpansion.  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 Did require sedation to control agitation.  - prognosis for meaningful neurological recovery is guarded given degree of cerebral atrophy, however increasing level of agitation is encouraging. -Switch to dexmedetomidine and allow fentanyl washout   Critically ill due to acute hypoxemic respiratory failure requiring mechanical ventilation -Daily SBT  Presumed Serratia marcescens pneumonia.  Likely cause of sepsis. -Complete course of antibiotics for 7 days.  Left sided hydropneumothorax: CT placed 2/2 (With 1L fluid drained), worsening PTX on 2/3 so additional tube placed.  No residual on today CXR - Continue left chest tube to suction - Flush/Wean per protocol  Critically ill due New onset Afib with RVR: converted with amio - Continue amiodarone - Monitor electrolytes  AKI minimal UOP, seems volume up on exam (8L pos) Hypomag Hypocalcemia (corrects to near normal) - Stop bicarb infusion. - Follow BMP - Supp Mag - Consider lasix infusiononce   Possible UGIB.   Hemoglobin has been stable with no recurrence. - Switch to BID Protonix.  Mild Transaminitis- ? Shock vs ETOH hx -continues to improve - Continue to monitor LFT and coagulations   Daily Goals Checklist  Pain/Anxiety/Delirium protocol (if indicated): Switch to dexmedetomidine VAP protocol (if indicated): bundle in place. Respiratory support goals: continue full ventilatory support. Blood pressure target: Keep MAP>65 DVT prophylaxis: BID UFH Nutrition Status: Nutrition Problem: Moderate Malnutrition Etiology: chronic illness, social / environmental circumstances Signs/Symptoms: mild fat depletion, moderate muscle depletion Interventions: Tube feeding on hold due to high residual.  Core track tomorrow. GI prophylaxis: Protonix bid Fluid status goals: Continue to diurese Urinary catheter: Guide hemodynamic management Glucose control: episodes of hypoglycemia Mobility/therapy needs: bed rest Antibiotic de-escalation: continue current antibiotics for 7 days Home medication reconciliation: none relevant. Daily labs: CMP. CBC Code Status: DNR Family Communication:  Sister updated yesterday.  Disposition: ICU   Labs   CBC: Recent Labs  Lab 08/27/2019 1543 08/15/2019 1554 08/17/19 2015 08/17/19 2017 08/18/19 0457 08/18/19 2333 08/19/19 0454 08/19/19 0500 08/19/19 0750 08/20/19 0322 08/21/19 0345  WBC 40.9*   < > 16.4*  --  13.2*  --   --  13.1*  --  16.7* 19.0*  NEUTROABS 37.6*  --  15.7*  --   --   --   --  11.5*  --  13.4* 16.7*  HGB 13.8   < > 13.5   < > 11.9*   < > 11.9* 11.0* 9.9* 11.4* 10.2*  HCT 46.2   < > 41.0   < > 35.8*   < > 35.0* 32.1* 29.0* 33.6* 29.3*  MCV 109.7*   < > 98.8  --  96.2  --   --  93.6  --  96.3 92.4  PLT 476*   < > 337  --  277  --   --  239  --  201 144*   < > = values in this interval not displayed.    Basic Metabolic Panel: Recent Labs  Lab 08/17/19 0714 08/17/19 1025 08/18/19 0012 08/18/19 0012 08/18/19 0457 08/18/19 0457  08/18/19 1630 08/18/19 2333 08/19/19 0454 08/19/19 0500 08/19/19 0750 08/19/19 1600 08/20/19 0322  NA 139   < > 144   < > 140   < >  --  141 140 140 144  --  141  K 4.1   < > 3.7   < > 4.2   < >  --  3.9 3.6 3.8 3.3*  --  4.2  CL 105  --  112*  --  108  --   --   --   --  101  --   --  101  CO2 18*  --  16*  --  19*  --   --   --   --  30  --   --  29  GLUCOSE 128*  --  99  --  169*  --   --   --   --  188*  --   --  126*  BUN 38*  --  36*  --  39*  --   --   --   --  43*  --   --  47*  CREATININE 2.70*  --  2.29*  --  2.38*  --   --   --   --  2.49*  --   --  2.62*  CALCIUM 6.7*  --  6.4*  --  6.1*  --   --   --   --  6.4*  --   --  6.8*  MG  --   --   --   --  1.6*  --  2.3  --   --  1.9  --  1.9  --   PHOS  --   --   --   --  4.0  --  2.9  --   --  3.2  --  4.4  --    < > = values in this interval not displayed.   GFR: Estimated Creatinine Clearance: 29.7 mL/min (A) (by C-G formula based on SCr of 2.62 mg/dL (H)). Recent Labs  Lab 09/05/2019 1544 08/20/2019 1744 08/19/2019 2347 08/17/19 1000 08/17/19 2015 08/17/19 2035 08/17/19 2315 08/18/19 0457 08/19/19 0500 08/20/19 0322 08/21/19 0345  PROCALCITON  --   --   --  72.79  --   --   --  41.05 38.90  --   --   WBC  --   --    < >  --    < >  --   --  13.2* 13.1* 16.7* 19.0*  LATICACIDVEN >11.0* >11.0*  --   --   --  6.5* 8.9*  --   --   --   --    < > =  values in this interval not displayed.    Liver Function Tests: Recent Labs  Lab 08/29/2019 1543 08/18/19 0457 08/19/19 0500 08/20/19 0322  AST 395* 941* 653* 499*  ALT 103* 226* 248* 224*  ALKPHOS 80 41 57 98  BILITOT 1.1 1.1 0.7 1.5*  PROT 7.2 4.1* 4.1* 4.8*  ALBUMIN 2.2* 1.1* 1.1* 1.2*   Recent Labs  Lab 09/07/2019 1543  LIPASE 19   No results for input(s): AMMONIA in the last 168 hours.  ABG    Component Value Date/Time   PHART 7.410 08/19/2019 0750   PCO2ART 44.5 08/19/2019 0750   PO2ART 60.0 (L) 08/19/2019 0750   HCO3 28.2 (H) 08/19/2019 0750   TCO2 30  08/19/2019 0750   ACIDBASEDEF 5.0 (H) 08/17/2019 2152   O2SAT 91.0 08/19/2019 0750     Coagulation Profile: Recent Labs  Lab 08/28/2019 1543 09/07/2019 2347  INR 2.0* 2.1*    Cardiac Enzymes: No results for input(s): CKTOTAL, CKMB, CKMBINDEX, TROPONINI in the last 168 hours.  HbA1C: Hgb A1c MFr Bld  Date/Time Value Ref Range Status  08/19/2019 01:16 AM 5.3 4.8 - 5.6 % Final    Comment:    (NOTE) Pre diabetes:          5.7%-6.4% Diabetes:              >6.4% Glycemic control for   <7.0% adults with diabetes     CBG: Recent Labs  Lab 08/20/19 2130 08/20/19 2327 08/21/19 0314 08/21/19 0754 08/21/19 1112  GLUCAP 110* 101* 84 76 75   CRITICAL CARE Performed by: Kipp Brood   Total critical care time: 45 minutes  Critical care time was exclusive of separately billable procedures and treating other patients.  Critical care was necessary to treat or prevent imminent or life-threatening deterioration.  Critical care was time spent personally by me on the following activities: development of treatment plan with patient and/or surrogate as well as nursing, discussions with consultants, evaluation of patient's response to treatment, examination of patient, obtaining history from patient or surrogate, ordering and performing treatments and interventions, ordering and review of laboratory studies, ordering and review of radiographic studies, pulse oximetry, re-evaluation of patient's condition and participation in multidisciplinary rounds.  Kipp Brood, MD [facility] ICU Physician [place]  Pager: [phone] Mobile: [phone] After hours: [phone].    08/21/2019 12:32 PM

## 2019-08-21 NOTE — Progress Notes (Signed)
Dr. Denese Killings gave verbal order to restart tube feeding of Vital AF 1.2 at 20 ml/hr.

## 2019-08-22 LAB — GLUCOSE, CAPILLARY
Glucose-Capillary: 43 mg/dL — CL (ref 70–99)
Glucose-Capillary: 71 mg/dL (ref 70–99)
Glucose-Capillary: 76 mg/dL (ref 70–99)
Glucose-Capillary: 82 mg/dL (ref 70–99)
Glucose-Capillary: 83 mg/dL (ref 70–99)
Glucose-Capillary: 87 mg/dL (ref 70–99)
Glucose-Capillary: 87 mg/dL (ref 70–99)
Glucose-Capillary: <10 mg/dL — CL (ref 70–99)

## 2019-08-22 LAB — CBC WITH DIFFERENTIAL/PLATELET
Abs Immature Granulocytes: 0.35 10*3/uL — ABNORMAL HIGH (ref 0.00–0.07)
Basophils Absolute: 0.1 10*3/uL (ref 0.0–0.1)
Basophils Relative: 0 %
Eosinophils Absolute: 0 10*3/uL (ref 0.0–0.5)
Eosinophils Relative: 0 %
HCT: 34.2 % — ABNORMAL LOW (ref 39.0–52.0)
Hemoglobin: 11.8 g/dL — ABNORMAL LOW (ref 13.0–17.0)
Immature Granulocytes: 2 %
Lymphocytes Relative: 3 %
Lymphs Abs: 0.5 10*3/uL — ABNORMAL LOW (ref 0.7–4.0)
MCH: 31.8 pg (ref 26.0–34.0)
MCHC: 34.5 g/dL (ref 30.0–36.0)
MCV: 92.2 fL (ref 80.0–100.0)
Monocytes Absolute: 1.5 10*3/uL — ABNORMAL HIGH (ref 0.1–1.0)
Monocytes Relative: 7 %
Neutro Abs: 18.7 10*3/uL — ABNORMAL HIGH (ref 1.7–7.7)
Neutrophils Relative %: 88 %
Platelets: 179 10*3/uL (ref 150–400)
RBC: 3.71 MIL/uL — ABNORMAL LOW (ref 4.22–5.81)
RDW: 13.7 % (ref 11.5–15.5)
WBC: 21.4 10*3/uL — ABNORMAL HIGH (ref 4.0–10.5)
nRBC: 0.6 % — ABNORMAL HIGH (ref 0.0–0.2)

## 2019-08-22 LAB — COMPREHENSIVE METABOLIC PANEL
ALT: 142 U/L — ABNORMAL HIGH (ref 0–44)
AST: 292 U/L — ABNORMAL HIGH (ref 15–41)
Albumin: 1.2 g/dL — ABNORMAL LOW (ref 3.5–5.0)
Alkaline Phosphatase: 142 U/L — ABNORMAL HIGH (ref 38–126)
Anion gap: 17 — ABNORMAL HIGH (ref 5–15)
BUN: 67 mg/dL — ABNORMAL HIGH (ref 8–23)
CO2: 31 mmol/L (ref 22–32)
Calcium: 8 mg/dL — ABNORMAL LOW (ref 8.9–10.3)
Chloride: 97 mmol/L — ABNORMAL LOW (ref 98–111)
Creatinine, Ser: 3.19 mg/dL — ABNORMAL HIGH (ref 0.61–1.24)
GFR calc Af Amer: 23 mL/min — ABNORMAL LOW (ref >60)
GFR calc non Af Amer: 19 mL/min — ABNORMAL LOW (ref >60)
Glucose, Bld: 80 mg/dL (ref 70–99)
Potassium: 3.6 mmol/L (ref 3.5–5.1)
Sodium: 145 mmol/L (ref 135–145)
Total Bilirubin: 1.1 mg/dL (ref 0.3–1.2)
Total Protein: 4.7 g/dL — ABNORMAL LOW (ref 6.5–8.1)

## 2019-08-22 MED ORDER — SODIUM CHLORIDE 0.9 % IV SOLN
2.0000 g | INTRAVENOUS | Status: AC
  Administered 2019-08-23: 2 g via INTRAVENOUS
  Filled 2019-08-22: qty 2

## 2019-08-22 MED ORDER — ALTEPLASE 2 MG IJ SOLR
2.0000 mg | Freq: Once | INTRAMUSCULAR | Status: AC
  Administered 2019-08-22: 2 mg

## 2019-08-22 MED ORDER — ALBUMIN HUMAN 25 % IV SOLN
12.5000 g | Freq: Once | INTRAVENOUS | Status: AC
  Administered 2019-08-22: 12.5 g via INTRAVENOUS
  Filled 2019-08-22: qty 50

## 2019-08-22 NOTE — Progress Notes (Signed)
Per flowsheet, proximal port found to have blood return at 0917.  VAST request for return after TPA instilled.  Proximal port found to have do not inject sticker appled and 2 other ports accessed with IV fluids and in line blood set.  Proximal port accessed blood wasted and flush with NS.  Cap changed and RN notified.

## 2019-08-22 NOTE — Procedures (Signed)
Extubation Procedure Note  Patient Details:   Name: Adit Riddles Dorgan DOB: 25-Mar-1956 MRN: [id number]   Airway Documentation:    Vent end date: 08/22/19 Vent end time: 1139   Evaluation  O2 sats: stable throughout Complications: No apparent complications Patient did tolerate procedure well. Bilateral Breath Sounds: Clear   Yes, pt could speak post extubation.  Pt extubated to 15 l/m [place] per physician's order.  Audrie Lia 08/22/2019, 11:41 AM

## 2019-08-22 NOTE — Progress Notes (Signed)
NAME:  Philip Wells, MRN:  [id number], DOB:  Feb 06, 1956, LOS: 6 ADMISSION DATE:  08/31/2019, CONSULTATION DATE:  09/09/2019 REFERRING MD:  ED - [facility], CHIEF COMPLAINT:  Status post cardiac arrest.   Brief History   [name] with PEA cardiac arrest and subsequent distributive shock, presumably due to sepsis.  History of present illness   HPI obtained from sister at bedside, [name] [phone].  Patient's initial registration was different [employer].  Sister verifies patient's DOB of 02/05/1956. MRN [id number] is not the correct patient.   Per sister, patient is 73 year AA male with no known past medical history. NKDA, no known medications, non smoker, unknown drug history, and does drink wine. Patient lives with his girlfriend, [name] in [place].  Story is unclear, but he was found unresponsive, unclear time down.  CPR performed by Fire department, reportedly 6 rounds of CPR with non-shockable rhythm prior to [place] with [facility] airway placed.    In ER, patient remained unresponsive, initial temp 33 degree celsius, initially normotensive, hypoglycemia, and in atrial tachycardia.  King airway replaced by ETT in ER.    Initially in coded by EMS as possible STEMI, however on cardiology eval in ER, initial rhythm showing aflutter with ratge 140s and STE in V3-4 which had resolved on arrival to ER.  Placed on cardizem gtt for rate control.    Workup noted for WBC 41K, Hgb 13.8, platelets 476, K 5.6, CO2 8, BUN 34, sCr 4.69, corrected Ca 9.3, glucose 57, AST 395, ALT 103, BNP 1142, trop hs 40, lactic  >11, INR 2, neg ETOH, neg tylenol/ salicylate level, GTXM4/ flu neg, CXR with good placement of ETT/ OGT, and nonspecific right perihilar density in which CT was recommended for followup.  CTH pending.  Patient treated D50, finishing 3L NS, and multiple bicarb pushes.  PCCM asked to admit.   Past Medical History  ETOH use- wine  Significant Hospital Events   2/2 admitted  with undifferentiated shock following cardiac arrest 2/4 continued pressor requirements.  Completed 36 degree TTM 2/6 more awake today.  Weaning pressor requirements. 2/7 Off pressors 2/8 extubated   Consults:  Cardiology - Patient R/O for STEMI  Procedures:  2/2 ETT >> 2/2 foley >> 2/2 LSC CVL >> replaced 2/5 08/28/2019 left chest tube>> 2/3 left Chest tube #2 >> 08/22/2019 left radial A-line>>  Significant Diagnostic Tests:   2/2 CTH >d severe atrophy but no hemorrhage.  2/3 Renal US > Increased echogenicity consistent with medical renal disease  2/3 Echocardiogram> normal EF and Grade 2 diastolic dysfunction.  2/4 EEG shows diffuse encephalopathy but no seizures  2/6 CXR (personally reviewed) right lower lobe chest infiltrate.  Small residual left-sided pneumothorax.  Micro Data:  2/2 SARS 2 / Flu A/b >> neg 2/2 BCx 2 >> No growth @ 4 days 2/2 UC >>  2/2 trach asp >> moderate Serratia marcescens  Antimicrobials:  2/2 vanc >> 2/2 cefepime >>  Interim history/subjective:  Now off vasopressors/sedation. Passed SBT, will attempt to extubate.  Objective   Blood pressure (!) 106/59, pulse 69, temperature 97.6 F (36.4 C), temperature source Oral, resp. rate 15, height 5' 11"  (1.803 m), weight 74.9 kg, SpO2 100 %. CVP:  [8 mmHg-11 mmHg] 8 mmHg  Vent Mode: PRVC FiO2 (%):  [40 %-70 %] 40 % Set Rate:  [16 bmp] 16 bmp Vt Set:  [60 mL-600 mL] 60 mL PEEP:  [5 cmH20] 5 cmH20 Plateau Pressure:  [24 cmH20-30 cmH20] 27  cmH20   Intake/Output Summary (Last 24 hours) at 08/22/2019 1108 Last data filed at 08/22/2019 0600 Gross per 24 hour  Intake 520.86 ml  Output 1240 ml  Net -719.14 ml   Filed Weights   08/21/19 0030 08/21/19 0420 08/22/19 0500  Weight: 73.8 kg 73.8 kg 74.9 kg    Examination:  General: Elderly male of normal body habitus in NAD HEENT: ET tube OG tube in place with no skin breakdown Neuro: Follows commands, nods understanding  CV: Heart sounds are  unremarkable.  PULM: Bronchial breathing at R lung base, otherwise clear, vesicular breathing bilaterally. Chest tubes in place, superior tube draining air as well as fluid, inferior tube no air draining GI: soft, non distended, active bowel sounds GU: Foley draining clear urine Extremities: warm/dry, increasing peripheral edema. Skin: no rashes or lesions   Resolved Hospital Problem list   Cardiac arrest   Assessment & Plan:   Cardiac Arrest - unclear etiology. Doesn't appear to be primary cardiac or PE given relatively normal TTE. Some questions of whether he was moving purposefully yesterday before requiring deep sedation.  - DNR if rearrests  Was critically ill due to undifferentiated shock: Questionably septic shock given GPC on tracheal aspirate, leukocytosis, and PCT 40. Echo with LVEF 50% and no improvement with PTC reexpansion.  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 Did require sedation to control agitation.  - prognosis for meaningful neurological recovery is guarded given degree of cerebral atrophy, however increasing level of awareness and interactivity is encouraging -Wean from precedex   Was critically ill due to acute hypoxemic respiratory failure requiring mechanical ventilation Pt extubated 2/8, on HFNC   Presumed Serratia marcescens pneumonia.  Likely cause of sepsis. -Complete course of antibiotics for 7 days.  Left sided hydropneumothorax: CT placed 2/2 (With 1L fluid drained), worsening PTX on 2/3 so additional tube placed.  No residual on today CXR - Continue left chest tube to suction - Flush/Wean per protocol - Consider removal of inferior tube  Critically ill due New onset Afib with RVR: converted with amio - Continue amiodarone - Monitor electrolytes  AKI UOP improving, still volume up on exam. Cre continues rising, ATN possible  Hypomag Hypocalcemia (corrects to near normal) - Stop  bicarb infusion. - Follow BMP - Supp Mag - Consider lasix infusiononce   Possible UGIB.  Hemoglobin has been stable with no recurrence. - Switch to BID Protonix.  Mild Transaminitis- ? Shock vs ETOH hx -continues to improve - Continue to monitor LFT and coagulations   Daily Goals Checklist  Pain/Anxiety/Delirium protocol (if indicated): Switch to dexmedetomidine VAP protocol (if indicated): bundle in place. Respiratory support goals: continue full ventilatory support. Blood pressure target: Keep MAP>65 DVT prophylaxis: BID UFH Nutrition Status: Nutrition Problem: Moderate Malnutrition Etiology: chronic illness, social / environmental circumstances Signs/Symptoms: mild fat depletion, moderate muscle depletion Interventions: Tube feeding on hold due to high residual.  Core track tomorrow. GI prophylaxis: Protonix bid Fluid status goals: Continue to diurese Urinary catheter: Guide hemodynamic management Glucose control: episodes of hypoglycemia Mobility/therapy needs: bed rest Antibiotic de-escalation: continue current antibiotics for 7 days Home medication reconciliation: none relevant. Daily labs: CMP. CBC Code Status: DNR Family Communication:  Sister updated yesterday.  Disposition: ICU   Labs   CBC: Recent Labs  Lab 08/17/19 2015 08/17/19 2017 08/18/19 0457 08/18/19 2333 08/19/19 0500 08/19/19 0750 08/20/19 0322 08/21/19 0345 08/22/19 0355  WBC 16.4*   < > 13.2*  --  13.1*  --  16.7* 19.0* 21.4*  NEUTROABS 15.7*  --   --   --  11.5*  --  13.4* 16.7* 18.7*  HGB 13.5   < > 11.9*   < > 11.0* 9.9* 11.4* 10.2* 11.8*  HCT 41.0   < > 35.8*   < > 32.1* 29.0* 33.6* 29.3* 34.2*  MCV 98.8   < > 96.2  --  93.6  --  96.3 92.4 92.2  PLT 337   < > 277  --  239  --  201 144* 179   < > = values in this interval not displayed.    Basic Metabolic Panel: Recent Labs  Lab 08/18/19 0012 08/18/19 0012 08/18/19 0457 08/18/19 1630 08/18/19 2333 08/19/19 0454  08/19/19 0500 08/19/19 0750 08/19/19 1600 08/20/19 0322 08/22/19 0355  NA 144   < > 140  --    < > 140 140 144  --  141 145  K 3.7   < > 4.2  --    < > 3.6 3.8 3.3*  --  4.2 3.6  CL 112*  --  108  --   --   --  101  --   --  101 97*  CO2 16*  --  19*  --   --   --  30  --   --  29 31  GLUCOSE 99  --  169*  --   --   --  188*  --   --  126* 80  BUN 36*  --  39*  --   --   --  43*  --   --  47* 67*  CREATININE 2.29*  --  2.38*  --   --   --  2.49*  --   --  2.62* 3.19*  CALCIUM 6.4*  --  6.1*  --   --   --  6.4*  --   --  6.8* 8.0*  MG  --   --  1.6* 2.3  --   --  1.9  --  1.9  --   --   PHOS  --   --  4.0 2.9  --   --  3.2  --  4.4  --   --    < > = values in this interval not displayed.   GFR: Estimated Creatinine Clearance: 24.8 mL/min (A) (by C-G formula based on SCr of 3.19 mg/dL (H)). Recent Labs  Lab 09/09/2019 1544 08/31/2019 1744 09/10/2019 2347 08/17/19 1000 08/17/19 2015 08/17/19 2035 08/17/19 2315 08/18/19 0457 08/18/19 0457 08/19/19 0500 08/20/19 0322 08/21/19 0345 08/22/19 0355  PROCALCITON  --   --   --  72.79  --   --   --  41.05  --  38.90  --   --   --   WBC  --   --    < >  --    < >  --   --  13.2*   < > 13.1* 16.7* 19.0* 21.4*  LATICACIDVEN >11.0* >11.0*  --   --   --  6.5* 8.9*  --   --   --   --   --   --    < > = values in this interval not displayed.    Liver Function Tests: Recent Labs  Lab 08/26/2019 1543 08/18/19 0457 08/19/19 0500 08/20/19 0322 08/22/19 0355  AST 395* 941* 653* 499* 292*  ALT 103* 226* 248* 224* 142*  ALKPHOS 80 41 57 98 142*  BILITOT 1.1 1.1 0.7 1.5* 1.1  PROT 7.2 4.1* 4.1* 4.8* 4.7*  ALBUMIN 2.2* 1.1* 1.1* 1.2* 1.2*   Recent Labs  Lab 09/01/2019 1543  LIPASE 19   No results for input(s): AMMONIA in the last 168 hours.  ABG    Component Value Date/Time   PHART 7.410 08/19/2019 0750   PCO2ART 44.5 08/19/2019 0750   PO2ART 60.0 (L) 08/19/2019 0750   HCO3 28.2 (H) 08/19/2019 0750   TCO2 30 08/19/2019 0750    ACIDBASEDEF 5.0 (H) 08/17/2019 2152   O2SAT 91.0 08/19/2019 0750     Coagulation Profile: Recent Labs  Lab 08/31/2019 1543 09/09/2019 2347  INR 2.0* 2.1*    Cardiac Enzymes: No results for input(s): CKTOTAL, CKMB, CKMBINDEX, TROPONINI in the last 168 hours.  HbA1C: Hgb A1c MFr Bld  Date/Time Value Ref Range Status  08/19/2019 01:16 AM 5.3 4.8 - 5.6 % Final    Comment:    (NOTE) Pre diabetes:          5.7%-6.4% Diabetes:              >6.4% Glycemic control for   <7.0% adults with diabetes     CBG: Recent Labs  Lab 08/21/19 2011 08/21/19 2350 08/21/19 2354 08/22/19 0404 08/22/19 0808  GLUCAP 83 44* 73 71 76

## 2019-08-22 NOTE — Progress Notes (Signed)
eLink Physician-Brief Progress Note Patient Name: Philip Wells DOB: 1956-04-24 MRN: [id number]   Date of Service  08/22/2019  HPI/Events of Note  RN called : Pt NPO swallow study tommorrow, pt at risk for asp, request for alternative to pm seroquel .  Not currently agitated. Camera care, pt resting in bed.    eICU Interventions  No new orders at this time. Avoid oversedation.  Await swallow eval in am.      Intervention Category Minor Interventions: Agitation / anxiety - evaluation and management  Demonta Wombles 08/22/2019, 8:01 PM

## 2019-08-23 ENCOUNTER — Encounter (HOSPITAL_COMMUNITY): Payer: Self-pay | Admitting: Pulmonary Disease

## 2019-08-23 ENCOUNTER — Inpatient Hospital Stay (HOSPITAL_COMMUNITY): Payer: Self-pay

## 2019-08-23 DIAGNOSIS — L8990 Pressure ulcer of unspecified site, unspecified stage: Secondary | ICD-10-CM | POA: Insufficient documentation

## 2019-08-23 LAB — CBC WITH DIFFERENTIAL/PLATELET
Abs Immature Granulocytes: 0.23 10*3/uL — ABNORMAL HIGH (ref 0.00–0.07)
Basophils Absolute: 0 10*3/uL (ref 0.0–0.1)
Basophils Relative: 0 %
Eosinophils Absolute: 0 10*3/uL (ref 0.0–0.5)
Eosinophils Relative: 0 %
HCT: 33.4 % — ABNORMAL LOW (ref 39.0–52.0)
Hemoglobin: 11.6 g/dL — ABNORMAL LOW (ref 13.0–17.0)
Immature Granulocytes: 1 %
Lymphocytes Relative: 4 %
Lymphs Abs: 0.6 10*3/uL — ABNORMAL LOW (ref 0.7–4.0)
MCH: 31.5 pg (ref 26.0–34.0)
MCHC: 34.7 g/dL (ref 30.0–36.0)
MCV: 90.8 fL (ref 80.0–100.0)
Monocytes Absolute: 1.3 10*3/uL — ABNORMAL HIGH (ref 0.1–1.0)
Monocytes Relative: 8 %
Neutro Abs: 13.7 10*3/uL — ABNORMAL HIGH (ref 1.7–7.7)
Neutrophils Relative %: 87 %
Platelets: 172 10*3/uL (ref 150–400)
RBC: 3.68 MIL/uL — ABNORMAL LOW (ref 4.22–5.81)
RDW: 14.1 % (ref 11.5–15.5)
WBC: 15.9 10*3/uL — ABNORMAL HIGH (ref 4.0–10.5)
nRBC: 0.4 % — ABNORMAL HIGH (ref 0.0–0.2)

## 2019-08-23 LAB — GLUCOSE, CAPILLARY
Glucose-Capillary: 77 mg/dL (ref 70–99)
Glucose-Capillary: 66 mg/dL — ABNORMAL LOW (ref 70–99)
Glucose-Capillary: 212 mg/dL — ABNORMAL HIGH (ref 70–99)
Glucose-Capillary: 51 mg/dL — ABNORMAL LOW (ref 70–99)
Glucose-Capillary: 14 mg/dL — CL (ref 70–99)
Glucose-Capillary: 570 mg/dL (ref 70–99)
Glucose-Capillary: 44 mg/dL — CL (ref 70–99)
Glucose-Capillary: 98 mg/dL (ref 70–99)
Glucose-Capillary: 85 mg/dL (ref 70–99)
Glucose-Capillary: 329 mg/dL — ABNORMAL HIGH (ref 70–99)
Glucose-Capillary: 176 mg/dL — ABNORMAL HIGH (ref 70–99)
Glucose-Capillary: 148 mg/dL — ABNORMAL HIGH (ref 70–99)

## 2019-08-23 LAB — COMPREHENSIVE METABOLIC PANEL
ALT: 132 U/L — ABNORMAL HIGH (ref 0–44)
AST: 273 U/L — ABNORMAL HIGH (ref 15–41)
Albumin: 1.4 g/dL — ABNORMAL LOW (ref 3.5–5.0)
Alkaline Phosphatase: 164 U/L — ABNORMAL HIGH (ref 38–126)
Anion gap: 14 (ref 5–15)
BUN: 82 mg/dL — ABNORMAL HIGH (ref 8–23)
CO2: 29 mmol/L (ref 22–32)
Calcium: 8.2 mg/dL — ABNORMAL LOW (ref 8.9–10.3)
Chloride: 100 mmol/L (ref 98–111)
Creatinine, Ser: 3.83 mg/dL — ABNORMAL HIGH (ref 0.61–1.24)
GFR calc Af Amer: 18 mL/min — ABNORMAL LOW (ref >60)
GFR calc non Af Amer: 16 mL/min — ABNORMAL LOW (ref >60)
Glucose, Bld: 78 mg/dL (ref 70–99)
Potassium: 3.3 mmol/L — ABNORMAL LOW (ref 3.5–5.1)
Sodium: 143 mmol/L (ref 135–145)
Total Bilirubin: 1.2 mg/dL (ref 0.3–1.2)
Total Protein: 5.2 g/dL — ABNORMAL LOW (ref 6.5–8.1)

## 2019-08-23 LAB — BASIC METABOLIC PANEL
Anion gap: 13 (ref 5–15)
BUN: 81 mg/dL — ABNORMAL HIGH (ref 8–23)
CO2: 29 mmol/L (ref 22–32)
Calcium: 8.1 mg/dL — ABNORMAL LOW (ref 8.9–10.3)
Chloride: 100 mmol/L (ref 98–111)
Creatinine, Ser: 3.89 mg/dL — ABNORMAL HIGH (ref 0.61–1.24)
GFR calc Af Amer: 18 mL/min — ABNORMAL LOW (ref >60)
GFR calc non Af Amer: 15 mL/min — ABNORMAL LOW (ref >60)
Glucose, Bld: 131 mg/dL — ABNORMAL HIGH (ref 70–99)
Potassium: 3.2 mmol/L — ABNORMAL LOW (ref 3.5–5.1)
Sodium: 142 mmol/L (ref 135–145)

## 2019-08-23 LAB — MAGNESIUM: Magnesium: 1.9 mg/dL (ref 1.7–2.4)

## 2019-08-23 MED ORDER — METOLAZONE 2.5 MG PO TABS
2.5000 mg | ORAL_TABLET | Freq: Once | ORAL | Status: AC
  Administered 2019-08-23: 2.5 mg via ORAL
  Filled 2019-08-23: qty 1

## 2019-08-23 MED ORDER — DILTIAZEM HCL-DEXTROSE 125-5 MG/125ML-% IV SOLN (PREMIX)
5.0000 mg/h | INTRAVENOUS | Status: DC
  Administered 2019-08-24: 5 mg/h via INTRAVENOUS
  Administered 2019-08-24: 10 mg/h via INTRAVENOUS
  Filled 2019-08-23 (×3): qty 125

## 2019-08-23 MED ORDER — DILTIAZEM HCL 25 MG/5ML IV SOLN
10.0000 mg | Freq: Once | INTRAVENOUS | Status: AC
  Administered 2019-08-23: 10 mg via INTRAVENOUS
  Filled 2019-08-23: qty 5

## 2019-08-23 MED ORDER — POTASSIUM CHLORIDE 10 MEQ/100ML IV SOLN
10.0000 meq | INTRAVENOUS | Status: AC
  Administered 2019-08-23 (×2): 10 meq via INTRAVENOUS
  Filled 2019-08-23 (×2): qty 100

## 2019-08-23 MED ORDER — SODIUM CHLORIDE 0.9 % IV SOLN
INTRAVENOUS | Status: DC | PRN
  Administered 2019-08-23 (×2): 250 mL via INTRAVENOUS
  Administered 2019-08-26: 1000 mL via INTRAVENOUS
  Administered 2019-08-28: 20 mL via INTRAVENOUS

## 2019-08-23 MED ORDER — POTASSIUM CHLORIDE 20 MEQ PO PACK
40.0000 meq | PACK | Freq: Two times a day (BID) | ORAL | Status: AC
  Administered 2019-08-23 – 2019-08-24 (×3): 40 meq via ORAL
  Filled 2019-08-23 (×2): qty 2

## 2019-08-23 MED ORDER — FREE WATER
200.0000 mL | Freq: Four times a day (QID) | Status: DC
  Administered 2019-08-23 – 2019-08-28 (×19): 200 mL

## 2019-08-23 MED ORDER — POTASSIUM CHLORIDE 20 MEQ/15ML (10%) PO SOLN: 20.0000 meq | Freq: Once | ORAL | Status: DC

## 2019-08-23 MED ORDER — DEXTROSE 50 % IV SOLN
25.0000 g | INTRAVENOUS | Status: AC
  Administered 2019-08-23: 09:00:00 25 g via INTRAVENOUS

## 2019-08-23 MED ORDER — VITAL 1.5 CAL PO LIQD
1000.0000 mL | ORAL | Status: DC
  Administered 2019-08-23: 1000 mL
  Filled 2019-08-23 (×2): qty 1000

## 2019-08-23 MED ORDER — PRO-STAT SUGAR FREE PO LIQD
30.0000 mL | Freq: Every day | ORAL | Status: DC
  Administered 2019-08-23 – 2019-08-24 (×2): 30 mL
  Filled 2019-08-23: qty 30

## 2019-08-23 MED ORDER — AMIODARONE IV BOLUS ONLY 150 MG/100ML
150.0000 mg | Freq: Once | INTRAVENOUS | Status: AC
  Administered 2019-08-23: 150 mg via INTRAVENOUS
  Filled 2019-08-23: qty 100

## 2019-08-23 MED ORDER — DEXTROSE 50 % IV SOLN
INTRAVENOUS | Status: AC
  Filled 2019-08-23: qty 50

## 2019-08-23 MED ORDER — DEXTROSE 50 % IV SOLN
12.5000 g | INTRAVENOUS | Status: AC
  Administered 2019-08-23: 08:00:00 12.5 g via INTRAVENOUS

## 2019-08-23 NOTE — Progress Notes (Signed)
INTERIM PROGRESS NOTE:  Potassium 3.3 this AM, however Cr is also increased at 3.8 (was 2.3 about 4 days ago). Patient receiving Lasix 120mg  IV today 2/9. Is also receiving swallow eval today.   Will administer IV KCl 4/9, then can re-check BMP. If K still low can order more IV KCl or KDur packets if patient passes swallow eval.   , DO [facility], PGY-2 08/23/2019 8:20 AM

## 2019-08-23 NOTE — Evaluation (Signed)
Clinical/Bedside Swallow Evaluation Patient Details  Name: Philip Wells MRN: [id number] Date of Birth: 04-Mar-1956  Today's Date: 08/23/2019 Time: SLP Start Time (ACUTE ONLY): 0347 SLP Stop Time (ACUTE ONLY): 0850 SLP Time Calculation (min) (ACUTE ONLY): 15 min  Past Medical History: History reviewed. No pertinent past medical history. Past Surgical History: History reviewed. No pertinent surgical history. HPI:  Pt is a 64 y.o. male who presented post cardiac arrest after apparently being found down for unknown time, ROSC after 6 rounds of CPR. Pt was intubated on 08/19/2019 and extubated on 08/22/19 at 11:39. Chest x-ray of 08/20/19 showed right greater than left pleural effusions.    Assessment / Plan / Recommendation Clinical Impression  Pt was seen for bedside swallow evaluation and was moderately cooperative with encouragement. He demonstrated impulsive behaviors and consistently requested and attempted to drink the whole cup of water despite education regarding his current risk of aspiration. He inconsistently demonstrated coughing with ice chips and thin liquids suggesting aspiration. No signs of aspiration were noted with puree but mutliple swallows were observed indicating possible pharyngeal residue. A pharyngeal delay is suspected but cannot be conclusively determined at bedside. SLP will follow closely to assess improvement in swallow function and the need for a instrumental assessment to further evaluate the swallow mechanism.  SLP Visit Diagnosis: Dysphagia, oropharyngeal phase (R13.12)    Aspiration Risk  Moderate aspiration risk;Severe aspiration risk    Diet Recommendation NPO;Alternative means - temporary   Medication Administration: Via alternative means    Other  Recommendations Oral Care Recommendations: Oral care QID   Follow up Recommendations Other (comment)(TBD)      Frequency and Duration min 2x/week  2 weeks       Prognosis Prognosis for Safe Diet  Advancement: Good Barriers to Reach Goals: Severity of deficits      Swallow Study   General Date of Onset: 08/22/19 HPI: Pt is a 64 y.o. male who presented post cardiac arrest after apparently being found down for unknown time, ROSC after 6 rounds of CPR. Pt was intubated on 08/15/2019 and extubated on 08/22/19 at 11:39. Chest x-ray of 08/20/19 showed right greater than left pleural effusions.  Type of Study: Bedside Swallow Evaluation Previous Swallow Assessment: None Diet Prior to this Study: NPO Temperature Spikes Noted: No Respiratory Status: Nasal cannula History of Recent Intubation: Yes Length of Intubations (days): 6 days Date extubated: 08/22/19 Behavior/Cognition: Alert;Impulsive;Agitated;Requires cueing Oral Cavity Assessment: Dry Oral Care Completed by SLP: Recent completion by staff Oral Cavity - Dentition: Adequate natural dentition Vision: Functional for self-feeding Self-Feeding Abilities: Needs assist Patient Positioning: Upright in bed;Postural control adequate for testing Baseline Vocal Quality: Breathy;Hoarse;Low vocal intensity;Wet Volitional Cough: Weak Volitional Swallow: Able to elicit    Oral/Motor/Sensory Function Overall Oral Motor/Sensory Function: Within functional limits   Ice Chips Ice chips: Impaired Presentation: Spoon Oral Phase Impairments: Poor awareness of bolus Pharyngeal Phase Impairments: Cough - Delayed;Wet Vocal Quality   Thin Liquid Thin Liquid: Impaired Presentation: Spoon Pharyngeal  Phase Impairments: Cough - Immediate;Cough - Delayed;Wet Vocal Quality;Suspected delayed Swallow    Nectar Thick Nectar Thick Liquid: Not tested   Honey Thick Honey Thick Liquid: Not tested   Puree Puree: Impaired Presentation: Spoon Pharyngeal Phase Impairments: Multiple swallows;Suspected delayed Swallow   Solid     Solid: Not tested     Philip Neu I. Vear Clock, MS, CCC-SLP Acute Rehabilitation Services Office number [phone] Pager  [phone]  Philip Wells 08/23/2019,11:44 AM

## 2019-08-23 NOTE — Progress Notes (Signed)
eLink Physician-Brief Progress Note Patient Name: Philip Wells DOB: March 29, 1956 MRN: [id number]   Date of Service  08/23/2019  HPI/Events of Note  Afib with RVR persists.  eICU Interventions  Cardizem infusion started.        Thomasene Lot Alfretta Pinch 08/23/2019, 11:45 PM

## 2019-08-23 NOTE — Procedures (Signed)
Cortrak  Person Inserting Tube:  Jaben Benegas, Verdon Cummins, RD Tube Type:  Cortrak - 43 inches Tube Location:  Right nare Initial Placement:  Postpyloric Secured by: Bridle Technique Used to Measure Tube Placement:  Documented cm marking at nare/ corner of mouth Cortrak Secured At:  72 cm   Consult received to place a Cortrak feeding tube.   No Xray is required. RN may begin using tube.  If the tube becomes dislodged, please keep the tube and contact the Cortrak tube at [email or web address] for replacement.   If after hours and replacement cannot be delayed, place an NG tube and confirm placement with an abdominal xray.   Eugene Gavia, MS, RD, LDN RD pager number and weekend/on-call pager number located in [place].

## 2019-08-23 NOTE — Progress Notes (Signed)
Notified ELINK MD regarding pt's persistent AFIB/RVR, awaiting further orders

## 2019-08-23 NOTE — Plan of Care (Signed)
  Problem: Education: Goal: Knowledge of General Education information will improve Description: Including pain rating scale, medication(s)/side effects and non-pharmacologic comfort measures Outcome: Not Progressing   Problem: Health Behavior/Discharge Planning: Goal: Ability to manage health-related needs will improve Outcome: Not Progressing   Problem: Clinical Measurements: Goal: Ability to maintain clinical measurements within normal limits will improve Outcome: Not Progressing   Problem: Clinical Measurements: Goal: Respiratory complications will improve Outcome: Not Progressing

## 2019-08-23 NOTE — Evaluation (Addendum)
Physical Therapy Evaluation Patient Details Name: Philip Wells MRN: [id number] DOB: [birth year] Today's Date: 08/23/2019   History of Present Illness  Patient is a 64 y/o male who presents after unwitnessed cardiac arrest with 6 rounds of CPR and subsequent distributive shock, presumably due to sepsis. Intubated 2/2-2/8. CT insertion x2 on 2/2 and 2/3. CXR 2/6-right lower lobe chest infiltrate. Small residual left-sided PTX. 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 No known PMH.  Clinical Impression  Patient presents with generalized weakness, deconditioning, impaired cognition, impaired balance, incoordination, dyspnea on exertion and impaired mobility s/p above. Pt not able to provide PLOF/history due to cognition. Pt lives with g/f per chart and likely independent. Today, pt requires Max A of 2 for rolling in bed x2 for pericare. Pt's Sp02 dropped to mid 70s on 15 L HF [place] with rolling. Declined sitting EOB today. Pt with hoarse voice and difficult to understand at times. Follows simple 1 step commands inconsistently with increased time. Difficulty with sequencing and problem solving. A&Ox2. Would benefit from SNF to maximize independence and mobility prior to return home. Will follow acutely.    Follow Up Recommendations SNF;Supervision for mobility/OOB    Equipment Recommendations  Other (comment)(TBA)    Recommendations for Other Services       Precautions / Restrictions Precautions Precautions: Fall Precaution Comments: watch 02, 15 L 02 HF [place], cortrak Restrictions Weight Bearing Restrictions: No      Mobility  Bed Mobility Overal bed mobility: Needs Assistance Bed Mobility: Rolling Rolling: Max assist;+2 for safety/equipment         General bed mobility comments: Max A to roll to right/left x2 with max cues for sequencing and assist, difficulty sequencing and problem solving. Deferred EOB as pt desating with rolling and  fatigue.  Transfers                 General transfer comment: Deferred  Ambulation/Gait                Stairs            Wheelchair Mobility    Modified Rankin (Stroke Patients Only)       Balance                                             Pertinent Vitals/Pain Pain Assessment: Faces Faces Pain Scale: No hurt    Home Living Family/patient expects to be discharged to:: Private residence Living Arrangements: Spouse/significant other(with gf per chart) Available Help at Discharge: Friend(s)                  Prior Function Level of Independence: Independent         Comments: Pt not able to provide PLOF/history due to cognition. Per chart, lives with his gf. Assuming independent     Hand Dominance        Extremity/Trunk Assessment   Upper Extremity Assessment Upper Extremity Assessment: Defer to OT evaluation(noted to have incoordination and dysmetria with BUEs during functional tasks.)    Lower Extremity Assessment Lower Extremity Assessment: Generalized weakness(Able to flex knees and extend LEs, wiggle toes, difficulty lifting LEs off bed against gravity.)       Communication   Communication: Other (comment)(hoarse voice)  Cognition Arousal/Alertness: Awake/alert Behavior During Therapy: Flat affect Overall Cognitive Status: Impaired/Different from baseline Area of Impairment: Orientation;Attention;Memory;Following  commands;Safety/judgement;Awareness;Problem solving                 Orientation Level: Disoriented to;Time;Situation Current Attention Level: Sustained Memory: Decreased short-term memory Following Commands: Follows one step commands with increased time;Follows one step commands inconsistently Safety/Judgement: Decreased awareness of safety;Decreased awareness of deficits Awareness: Intellectual Problem Solving: Slow processing;Requires verbal cues;Difficulty sequencing;Requires tactile  cues General Comments: Able to state "hospital." Does not know birthday without month options or date with options/cues. Difficulty understanding speech.      General Comments General comments (skin integrity, edema, etc.): Pt on 15L HF [place] and Sp02 dropped to mid 70s with rolling in bed for pericare. HR ranging from low 100s-136 bpm, A-fib.    Exercises     Assessment/Plan    PT Assessment Patient needs continued PT services  PT Problem List Decreased strength;Decreased mobility;Decreased safety awareness;Decreased range of motion;Decreased coordination;Decreased cognition;Decreased activity tolerance;Cardiopulmonary status limiting activity;Decreased balance       PT Treatment Interventions Therapeutic activities;DME instruction;Gait training;Cognitive remediation;Therapeutic exercise;Patient/family education;Wheelchair mobility training;Balance training;Functional mobility training;Neuromuscular re-education    PT Goals (Current goals can be found in the Care Plan section)  Acute Rehab PT Goals Patient Stated Goal: none stated PT Goal Formulation: Patient unable to participate in goal setting Time For Goal Achievement: 09/06/19 Potential to Achieve Goals: Fair    Frequency Min 3X/week   Barriers to discharge   not sure of barriers    Co-evaluation               AM-PAC PT "6 Clicks" Mobility  Outcome Measure Help needed turning from your back to your side while in a flat bed without using bedrails?: A Lot Help needed moving from lying on your back to sitting on the side of a flat bed without using bedrails?: Total Help needed moving to and from a bed to a chair (including a wheelchair)?: Total Help needed standing up from a chair using your arms (e.g., wheelchair or bedside chair)?: Total Help needed to walk in hospital room?: Total Help needed climbing 3-5 steps with a railing? : Total 6 Click Score: 7    End of Session Equipment Utilized During Treatment:  Oxygen Activity Tolerance: Patient limited by fatigue;Treatment limited secondary to medical complications (Comment)(drop in Sp02) Patient left: in bed;with call bell/phone within reach;with SCD's reapplied Nurse Communication: Mobility status PT Visit Diagnosis: Muscle weakness (generalized) (M62.81)    Time: 1751-0258 PT Time Calculation (min) (ACUTE ONLY): 26 min   Charges:   PT Evaluation $PT Eval Moderate Complexity: 1 Mod PT Treatments $Therapeutic Activity: 8-22 mins        Vale Haven, PT, DPT Acute Rehabilitation Services Pager [phone] Office [phone]      Blake Divine A Lanier Ensign 08/23/2019, 3:45 PM

## 2019-08-23 NOTE — Progress Notes (Signed)
eLink Physician-Brief Progress Note Patient Name: Philip Wells DOB: [birth year] MRN: [id number]   Date of Service  08/23/2019  HPI/Events of Note  Pt in Afib with RVR, he is on oral Amiodarone.  eICU Interventions  Amiodarone 150 mg iv bolus x 1        Chike Farrington U Zamari Vea 08/23/2019, 10:19 PM

## 2019-08-23 NOTE — Progress Notes (Signed)
Nutrition Follow-up  DOCUMENTATION CODES:   Non-severe (moderate) malnutrition in context of chronic illness  INTERVENTION:   Recommend addition of bowel regimen as no BM since admission  Tube Feeding:  Vital 1.5 at 50 ml/hr Pro-Stat 30 mL daily Provides 1900 kcals, 96 g of protein and 912 mL of free water Meets 100% estimated calorie and protein needs  Recommend 200 mL free water q 6 hours: 1712 mL of free water  NUTRITION DIAGNOSIS:   Moderate Malnutrition related to chronic illness, social / environmental circumstances as evidenced by mild fat depletion, moderate muscle depletion.  Being addressed via TF   GOAL:   Patient will meet greater than or equal to 90% of their needs  Progressing  MONITOR:   TF tolerance, Labs, Weight trends, Diet advancement  REASON FOR ASSESSMENT:   Consult, Ventilator Enteral/tube feeding initiation and management  ASSESSMENT:   63 yo male admitted post cardiac arrest with nonshockable rhythm with estimated downtime of 30 minutes, acute respiratory failure requiring intubation, shock, ARF with metabolic acidosis and oliguria. PMH includes EtOH use (negative EtOH on admission)  2/02 Admitted, Intubated, TTM 2/04 TTM complete 2/08 Extubated 2/09 Post-pyloric Cortrak placed  SLP evaluated patient this AM and pt to remain NPO. Cortrak placed and TF to resume  Hypoglycemic this AM, NPO. This should improve when TF resumed  No BM since admission; no bowel regimen noted   Labs: CBGs 51-212, potassium 3.3 , Creatinine 3.83, BUN 82 Meds: D10 at 30 ml/hr, lasix drip, ss novolog  Diet Order:   Diet Order    None      EDUCATION NEEDS:   Not appropriate for education at this time  Skin:  Skin Assessment: Reviewed RN Assessment  Last BM:  no documented BM  Height:   Ht Readings from Last 1 Encounters:  08/25/2019 5\' 11"  (1.803 m)    Weight:   Wt Readings from Last 1 Encounters:  08/23/19 73.3 kg    BMI:  Body mass index  is 22.54 kg/m.  Estimated Nutritional Needs:   Kcal:  1825-2190 kcals  Protein:  91-110 g  Fluid:  >/= 1.8 L   10/21/19 MS, RDN, LDN, CNSC RD Pager Number and Weekend/On-Call After Hours Pager Located in [place]

## 2019-08-23 NOTE — Progress Notes (Signed)
Hypoglycemic Event  CBG: 61 @ 0808  Treatment:12.5g Dextrose 50%  Symptoms: None  Follow-up CBG: [id number] CBG Result:51  Treatment: 25g Dextrose 50%  Possible Reasons for Event: Pt not receiving oral intake d/t failed swallow test.   Comments/MD notified: MD notified     Minerva Fester

## 2019-08-23 NOTE — Progress Notes (Signed)
Assisted tele visit to patient with family member. However family member never joined the video call. I attempted to call on the number provided but no one answered the phone.   Redmond Pulling, RN

## 2019-08-23 NOTE — Progress Notes (Addendum)
Notified ELINK RN regarding pt's heart rate increasing from low 100, ST to 120s up to 160s at times. Also noted in chart that pt is now on amio PO and had an amio gtt at some pt. Awaiting further orders.

## 2019-08-23 NOTE — Progress Notes (Signed)
eLink Physician-Brief Progress Note Patient Name: Philip Wells DOB: Mar 20, 1956 MRN: [id number]   Date of Service  08/23/2019  HPI/Events of Note  Pt continues to have Afib with RVR despite a bolus of 150 mg of iv Amiodarone. Pt is hemodynamically stable.  eICU Interventions  Diltiazem 10 mg iv x 1        [name] [name] 08/23/2019, 11:22 PM

## 2019-08-23 NOTE — Progress Notes (Addendum)
NAME:  Philip Wells, MRN:  [id number], DOB:  05-Mar-1956, LOS: 7 ADMISSION DATE:  08/15/2019, CONSULTATION DATE:  09/05/2019 REFERRING MD:  ED - [facility], CHIEF COMPLAINT:  Status post cardiac arrest.   Brief History   [name] with PEA cardiac arrest and subsequent distributive shock, presumably due to sepsis.  History of present illness   HPI obtained from sister at bedside, [name] [phone].  Patient's initial registration was different [employer].  Sister verifies patient's DOB of 1956/06/07. MRN [id number] is not the correct patient.   Per sister, patient is 72 year AA male with no known past medical history. NKDA, no known medications, non smoker, unknown drug history, and does drink wine. Patient lives with his girlfriend, [name] in [place].  Story is unclear, but he was found unresponsive, unclear time down.  CPR performed by Fire department, reportedly 6 rounds of CPR with non-shockable rhythm prior to [facility] with [facility] airway placed.    In ER, patient remained unresponsive, initial temp 33 degree celsius, initially normotensive, hypoglycemia, and in atrial tachycardia.  King airway replaced by ETT in ER.    Initially in coded by EMS as possible STEMI, however on cardiology eval in ER, initial rhythm showing aflutter with ratge 140s and STE in V3-4 which had resolved on arrival to ER.  Placed on cardizem gtt for rate control.    Workup noted for WBC 41K, Hgb 13.8, platelets 476, K 5.6, CO2 8, BUN 34, sCr 4.69, corrected Ca 9.3, glucose 57, AST 395, ALT 103, BNP 1142, trop hs 40, lactic  >11, INR 2, neg ETOH, neg tylenol/ salicylate level, OYDX4/ flu neg, CXR with good placement of ETT/ OGT, and nonspecific right perihilar density in which CT was recommended for followup.  CTH pending.  Patient treated D50, finishing 3L NS, and multiple bicarb pushes.  PCCM asked to admit.   Past Medical History  ETOH use- wine  Significant Hospital Events   2/2 admitted  with undifferentiated shock following cardiac arrest 2/4 continued pressor requirements.  Completed 36 degree TTM 2/6 more awake today.  Weaning pressor requirements. 2/7 Off pressors 2/8 extubated   Consults:  Cardiology - Patient R/O for STEMI  Procedures:  2/2 ETT >>08/22/2019 2/2 foley >> 2/2 LSC CVL >> replaced 2/5 08/24/2019 left chest tube>> 2/3 left Chest tube #2 >> 08/21/2019 left radial A-line>>  Significant Diagnostic Tests:   2/2 CTH >d severe atrophy but no hemorrhage.  2/3 Renal US > Increased echogenicity consistent with medical renal disease  2/3 Echocardiogram> normal EF and Grade 2 diastolic dysfunction.  2/4 EEG shows diffuse encephalopathy but no seizures  2/6 CXR (personally reviewed) right lower lobe chest infiltrate.  Small residual left-sided pneumothorax.  Micro Data:  2/2 SARS 2 / Flu A/b >> neg 2/2 BCx 2 >> No growth @ 4 days 2/2 UC >>  2/2 trach asp >> moderate Serratia marcescens  Antimicrobials:  2/2 vanc >> 2/2 cefepime >>  Interim history/subjective:  Extubated 2/8 Currently on 10 L [place] with sats 89-90 % Aspirated this am with swallow eval>> Increased oxygen from 7 L to 10. +13 L>> sounds very junky Creatinine has increased overnight from 3.1 >> 3.8 Got a dose of Lasix 100 already this am CVP is 8>>  3+ edema  Objective   Blood pressure (!) 145/85, pulse (!) 102, temperature 97.7 F (36.5 C), temperature source Oral, resp. rate (!) 31, height 5' 11"  (1.803 m), weight 73.3 kg, SpO2 95 %. CVP:  [  4 mmHg] 4 mmHg      Intake/Output Summary (Last 24 hours) at 08/23/2019 1204 Last data filed at 08/23/2019 1100 Gross per 24 hour  Intake 613.22 ml  Output 2160 ml  Net -1546.78 ml   Filed Weights   08/21/19 0420 08/22/19 0500 08/23/19 0500  Weight: 73.8 kg 74.9 kg 73.3 kg    Examination:  General: Elderly male of normal body habitus supine in bed on 10 L  HEENT: NCAT, temporal wasting, [employer] feeding tuibe Neuro: Follows commands,  nods understanding, oriented to self  CV: S1, S2, RRR, No RMG, Tachy per tele at 120 PULM: Bilateral chest excursion, RR is 30, some accessory muscle use noted. Rhonchi throughout, coughing up copious secretions that are frothy.  Chest tubes in place, CT #2 with 1 chamber air leak and no drainage, Number 1 no leak with 200 cc's out overnight,  GI: soft, non distended, active bowel sounds GU: Foley intact and draining clear amber urine Extremities: warm/dry, 3+  peripheral edema. Skin: no rashes or lesions, warm dry and intact   Resolved Hospital Problem list   Cardiac arrest   Assessment & Plan:   Cardiac Arrest - unclear etiology.  Tachy with frequent PAC's Doesn't appear to be primary cardiac or PE given relatively normal TTE.  ST with Frequent PAC's Plan - Continue Amiodarone as ordered - DNR if re-arrests    Was critically ill due to undifferentiated shock:  Questionably septic shock given GPC on tracheal aspirate, leukocytosis, and PCT 40.  Echo with LVEF 50% and no improvement with PTC reexpansion.  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 Did require sedation to control agitation.  - prognosis for meaningful neurological recovery is guarded given degree of cerebral atrophy, however increasing level of awareness and interactivity is encouraging - Off precedex and all sedation, awake and oriented to self and place Plan - Supportive - Monitor for any neurological changes - Minimize sedation    Was critically ill due to acute hypoxemic respiratory failure requiring mechanical ventilation Pt extubated 2/8, on HFNC  Increasing Right Pleural effusion per CXR Aspirated 2/9 am with swallow eval Now requiring additional oxygen, RR 30 and sats 89-93% Plan CXR now>> Increasing R pleural effusion Consider  Thoracentesis Failed Swallow 2/9 Aggressive Pulmonary Toilet PT/OT for ambulation OOB to chair IS Q 2 Continue chest PT Q 4 Will re check swallow 2/10  Presumed Serratia marcescens pneumonia.   Likely cause of sepsis. Continued Leukocytosis, but improving -Complete course of antibiotics for 7 days. - Trend fever and WBC - Re culture as is clinically indicated  Left sided hydropneumothorax:  - CT placed 2/2 (With 1L fluid drained),  - worsening PTX on 2/3 so additional tube placed CT #1 no leak and draining CT #2 1 chamber leak and no drainage Plan - Continue left chest tube #1 to suction - Flush/Wean per protocol - Consider removal of inferior tube - Monitor drainage  Critically ill due New onset Afib with RVR: converted with amio - Continue amiodarone - Monitor electrolytes - Will check Mag and replete as needed - Calcium corrects with albumin - Will check ionized calcium in am   AKI UOP improving,  Remains + 13 L Cre continues rising Hypo Mag Calcium corrects with albumin Plan Trend BMET Consider holding lasix  Maintain renal perfusion  Avoid nephrotoxic medications Replete electrolytes as needed    Possible UGIB.   Hemoglobin has been stable with no recurrence. Plan - Switch to BID Protonix. - Trend CBC - Monitor for bleeding  Mild Transaminitis-  ? Shock vs ETOH hx -continues to improve Plan - Continue to monitor LFT and coagulations - PT/INR 2/10 am  Goals of Care Pt is a DNR. I am concerned with his recent aspiration and increased need for oxygen support  ( 10 L)  that he may continue to decompensate from a respiratory standpoint.  Additionally his renal function is worsening . I will consult Palliative care for a clear plan of care to support the DNR Goals of care    Daily Goals Checklist  Pain/Anxiety/Delirium protocol (if indicated): prn VAP protocol (if indicated): bundle in place. Respiratory support goals: Extubated. Blood pressure  target: Keep MAP>65 DVT prophylaxis: BID UFH Nutrition Status: TF, NPO Nutrition Problem: Moderate Malnutrition Etiology: chronic illness, social / environmental circumstances Signs/Symptoms: mild fat depletion, moderate muscle depletion Interventions: Tube feeding on hold due to high residual.  Core track tomorrow. GI prophylaxis: Protonix bid Urinary catheter: Guide hemodynamic management Glucose control: episodes of hypoglycemia Mobility/therapy needs: OOB to chair Antibiotic de-escalation: continue current antibiotics for 7 days Code Status: DNR Family Communication:  Sister updated yesterday.  Disposition: ICU for now   Labs   CBC: Recent Labs  Lab 08/19/19 0500 08/19/19 0500 08/19/19 0750 08/20/19 0322 08/21/19 0345 08/22/19 0355 08/23/19 0500  WBC 13.1*  --   --  16.7* 19.0* 21.4* 15.9*  NEUTROABS 11.5*  --   --  13.4* 16.7* 18.7* 13.7*  HGB 11.0*   < > 9.9* 11.4* 10.2* 11.8* 11.6*  HCT 32.1*   < > 29.0* 33.6* 29.3* 34.2* 33.4*  MCV 93.6  --   --  96.3 92.4 92.2 90.8  PLT 239  --   --  201 144* 179 172   < > = values in this interval not displayed.    Basic Metabolic Panel: Recent Labs  Lab 08/18/19 0457 08/18/19 1630 08/18/19 2333 08/19/19 0500 08/19/19 0750 08/19/19 1600 08/20/19 0322 08/22/19 0355 08/23/19 0500  NA 140  --    < > 140 144  --  141 145 143  K 4.2  --    < > 3.8 3.3*  --  4.2 3.6 3.3*  CL 108  --   --  101  --   --  101 97* 100  CO2 19*  --   --  30  --   --  29 31 29   GLUCOSE 169*  --   --  188*  --   --  126* 80 78  BUN 39*  --   --  43*  --   --  47* 67* 82*  CREATININE 2.38*  --   --  2.49*  --   --  2.62* 3.19* 3.83*  CALCIUM 6.1*  --   --  6.4*  --   --  6.8* 8.0* 8.2*  MG 1.6* 2.3  --  1.9  --  1.9  --   --   --   PHOS 4.0 2.9  --  3.2  --  4.4  --   --   --    < > = values in this interval not displayed.   GFR: Estimated Creatinine Clearance: 20.2 mL/min (A) (by C-G formula based on SCr of 3.83 mg/dL (H)). Recent Labs    Lab 08/21/2019 1544 08/30/2019 1744  09/01/2019 2347 08/17/19 1000 08/17/19 2015 08/17/19 2035 08/17/19 2315 08/18/19 0457 08/18/19 0457 08/19/19 0500 08/19/19 0500 08/20/19 0322 08/21/19 0345 08/22/19 0355 08/23/19 0500  PROCALCITON  --   --   --  72.79  --   --   --  41.05  --  38.90  --   --   --   --   --   WBC  --   --    < >  --    < >  --   --  13.2*   < > 13.1*   < > 16.7* 19.0* 21.4* 15.9*  LATICACIDVEN >11.0* >11.0*  --   --   --  6.5* 8.9*  --   --   --   --   --   --   --   --    < > = values in this interval not displayed.    Liver Function Tests: Recent Labs  Lab 08/18/19 0457 08/19/19 0500 08/20/19 0322 08/22/19 0355 08/23/19 0500  AST 941* 653* 499* 292* 273*  ALT 226* 248* 224* 142* 132*  ALKPHOS 41 57 98 142* 164*  BILITOT 1.1 0.7 1.5* 1.1 1.2  PROT 4.1* 4.1* 4.8* 4.7* 5.2*  ALBUMIN 1.1* 1.1* 1.2* 1.2* 1.4*   Recent Labs  Lab 08/30/2019 1543  LIPASE 19   No results for input(s): AMMONIA in the last 168 hours.  ABG    Component Value Date/Time   PHART 7.410 08/19/2019 0750   PCO2ART 44.5 08/19/2019 0750   PO2ART 60.0 (L) 08/19/2019 0750   HCO3 28.2 (H) 08/19/2019 0750   TCO2 30 08/19/2019 0750   ACIDBASEDEF 5.0 (H) 08/17/2019 2152   O2SAT 91.0 08/19/2019 0750     Coagulation Profile: Recent Labs  Lab 08/22/2019 1543 09/04/2019 2347  INR 2.0* 2.1*    Cardiac Enzymes: No results for input(s): CKTOTAL, CKMB, CKMBINDEX, TROPONINI in the last 168 hours.  HbA1C: Hgb A1c MFr Bld  Date/Time Value Ref Range Status  08/19/2019 01:16 AM 5.3 4.8 - 5.6 % Final    Comment:    (NOTE) Pre diabetes:          5.7%-6.4% Diabetes:              >6.4% Glycemic control for   <7.0% adults with diabetes     CBG: Recent Labs  Lab 08/22/19 2333 08/23/19 0330 08/23/19 0808 08/23/19 0842 08/23/19 0924  GLUCAP 87 77 66* 51* 212*   PCCM APP Time 45 minutes   Magdalen Spatz, MSN, AGACNP-BC [place] Pager #  [phone] After 4 pm please call [phone] 08/23/2019  12:56 PM

## 2019-08-24 ENCOUNTER — Inpatient Hospital Stay (HOSPITAL_COMMUNITY): Payer: Self-pay

## 2019-08-24 LAB — COMPREHENSIVE METABOLIC PANEL
ALT: 134 U/L — ABNORMAL HIGH (ref 0–44)
AST: 284 U/L — ABNORMAL HIGH (ref 15–41)
Albumin: 1.3 g/dL — ABNORMAL LOW (ref 3.5–5.0)
Alkaline Phosphatase: 163 U/L — ABNORMAL HIGH (ref 38–126)
Anion gap: 11 (ref 5–15)
BUN: 83 mg/dL — ABNORMAL HIGH (ref 8–23)
CO2: 30 mmol/L (ref 22–32)
Calcium: 8.2 mg/dL — ABNORMAL LOW (ref 8.9–10.3)
Chloride: 103 mmol/L (ref 98–111)
Creatinine, Ser: 3.92 mg/dL — ABNORMAL HIGH (ref 0.61–1.24)
GFR calc Af Amer: 18 mL/min — ABNORMAL LOW (ref >60)
GFR calc non Af Amer: 15 mL/min — ABNORMAL LOW (ref >60)
Glucose, Bld: 151 mg/dL — ABNORMAL HIGH (ref 70–99)
Potassium: 3.5 mmol/L (ref 3.5–5.1)
Sodium: 144 mmol/L (ref 135–145)
Total Bilirubin: 0.8 mg/dL (ref 0.3–1.2)
Total Protein: 5 g/dL — ABNORMAL LOW (ref 6.5–8.1)

## 2019-08-24 LAB — POCT I-STAT 7, (LYTES, BLD GAS, ICA,H+H)
Acid-Base Excess: 8 mmol/L — ABNORMAL HIGH (ref 0.0–2.0)
Acid-Base Excess: 9 mmol/L — ABNORMAL HIGH (ref 0.0–2.0)
Bicarbonate: 30.9 mmol/L — ABNORMAL HIGH (ref 20.0–28.0)
Bicarbonate: 34.4 mmol/L — ABNORMAL HIGH (ref 20.0–28.0)
Calcium, Ion: 1.18 mmol/L (ref 1.15–1.40)
Calcium, Ion: 1.22 mmol/L (ref 1.15–1.40)
HCT: 36 % — ABNORMAL LOW (ref 39.0–52.0)
HCT: 35 % — ABNORMAL LOW (ref 39.0–52.0)
Hemoglobin: 12.2 g/dL — ABNORMAL LOW (ref 13.0–17.0)
Hemoglobin: 11.9 g/dL — ABNORMAL LOW (ref 13.0–17.0)
O2 Saturation: 91 %
O2 Saturation: 95 %
Patient temperature: 98.6
Potassium: 3.4 mmol/L — ABNORMAL LOW (ref 3.5–5.1)
Potassium: 3.5 mmol/L (ref 3.5–5.1)
Sodium: 144 mmol/L (ref 135–145)
Sodium: 145 mmol/L (ref 135–145)
TCO2: 32 mmol/L (ref 22–32)
TCO2: 36 mmol/L — ABNORMAL HIGH (ref 22–32)
pCO2 arterial: 37.4 mmHg (ref 32.0–48.0)
pCO2 arterial: 51.6 mmHg — ABNORMAL HIGH (ref 32.0–48.0)
pH, Arterial: 7.526 — ABNORMAL HIGH (ref 7.350–7.450)
pH, Arterial: 7.432 (ref 7.350–7.450)
pO2, Arterial: 53 mmHg — ABNORMAL LOW (ref 83.0–108.0)
pO2, Arterial: 74 mmHg — ABNORMAL LOW (ref 83.0–108.0)

## 2019-08-24 LAB — CBC WITH DIFFERENTIAL/PLATELET
Abs Immature Granulocytes: 0.19 10*3/uL — ABNORMAL HIGH (ref 0.00–0.07)
Basophils Absolute: 0 10*3/uL (ref 0.0–0.1)
Basophils Relative: 0 %
Eosinophils Absolute: 0 10*3/uL (ref 0.0–0.5)
Eosinophils Relative: 0 %
HCT: 33.5 % — ABNORMAL LOW (ref 39.0–52.0)
Hemoglobin: 11.8 g/dL — ABNORMAL LOW (ref 13.0–17.0)
Immature Granulocytes: 1 %
Lymphocytes Relative: 3 %
Lymphs Abs: 0.6 10*3/uL — ABNORMAL LOW (ref 0.7–4.0)
MCH: 32.1 pg (ref 26.0–34.0)
MCHC: 35.2 g/dL (ref 30.0–36.0)
MCV: 91 fL (ref 80.0–100.0)
Monocytes Absolute: 1.6 10*3/uL — ABNORMAL HIGH (ref 0.1–1.0)
Monocytes Relative: 9 %
Neutro Abs: 14.9 10*3/uL — ABNORMAL HIGH (ref 1.7–7.7)
Neutrophils Relative %: 87 %
Platelets: 162 10*3/uL (ref 150–400)
RBC: 3.68 MIL/uL — ABNORMAL LOW (ref 4.22–5.81)
RDW: 14.2 % (ref 11.5–15.5)
WBC: 17.3 10*3/uL — ABNORMAL HIGH (ref 4.0–10.5)
nRBC: 0.3 % — ABNORMAL HIGH (ref 0.0–0.2)

## 2019-08-24 LAB — PROTEIN, TOTAL: Total Protein: 5 g/dL — ABNORMAL LOW (ref 6.5–8.1)

## 2019-08-24 LAB — BODY FLUID CELL COUNT WITH DIFFERENTIAL
Lymphs, Fluid: 11 %
Monocyte-Macrophage-Serous Fluid: 17 % — ABNORMAL LOW (ref 50–90)
Neutrophil Count, Fluid: 72 % — ABNORMAL HIGH (ref 0–25)
Total Nucleated Cell Count, Fluid: 115 cu mm (ref 0–1000)

## 2019-08-24 LAB — PROTIME-INR
INR: 1.2 (ref 0.8–1.2)
Prothrombin Time: 14.7 seconds (ref 11.4–15.2)

## 2019-08-24 LAB — GLUCOSE, CAPILLARY
Glucose-Capillary: 128 mg/dL — ABNORMAL HIGH (ref 70–99)
Glucose-Capillary: 154 mg/dL — ABNORMAL HIGH (ref 70–99)
Glucose-Capillary: 122 mg/dL — ABNORMAL HIGH (ref 70–99)
Glucose-Capillary: 142 mg/dL — ABNORMAL HIGH (ref 70–99)
Glucose-Capillary: 90 mg/dL (ref 70–99)

## 2019-08-24 LAB — CALCIUM, IONIZED: Calcium, Ionized, Serum: 5 mg/dL (ref 4.5–5.6)

## 2019-08-24 LAB — LACTATE DEHYDROGENASE: LDH: 531 U/L — ABNORMAL HIGH (ref 98–192)

## 2019-08-24 LAB — MAGNESIUM: Magnesium: 1.8 mg/dL (ref 1.7–2.4)

## 2019-08-24 MED ORDER — MIDAZOLAM HCL 2 MG/2ML IJ SOLN
INTRAMUSCULAR | Status: AC
  Administered 2019-08-24: 1 mg
  Filled 2019-08-24: qty 2

## 2019-08-24 MED ORDER — VITAL 1.5 CAL PO LIQD
1000.0000 mL | ORAL | Status: DC
  Administered 2019-08-24 – 2019-08-27 (×4): 1000 mL
  Filled 2019-08-24 (×5): qty 1000

## 2019-08-24 MED ORDER — FUROSEMIDE 10 MG/ML IJ SOLN
100.0000 mg | Freq: Two times a day (BID) | INTRAVENOUS | Status: DC
  Administered 2019-08-24 – 2019-08-30 (×13): 100 mg via INTRAVENOUS
  Filled 2019-08-24 (×20): qty 10

## 2019-08-24 MED ORDER — CHLORHEXIDINE GLUCONATE 0.12% ORAL RINSE (MEDLINE KIT)
15.0000 mL | Freq: Two times a day (BID) | OROMUCOSAL | Status: DC
  Administered 2019-08-24 – 2019-08-28 (×8): 15 mL via OROMUCOSAL

## 2019-08-24 MED ORDER — VECURONIUM BROMIDE 10 MG IV SOLR
10.0000 mg | Freq: Once | INTRAVENOUS | Status: AC
  Administered 2019-08-24: 10 mg via INTRAVENOUS

## 2019-08-24 MED ORDER — PHENYLEPHRINE HCL-NACL 10-0.9 MG/250ML-% IV SOLN
INTRAVENOUS | Status: AC
  Administered 2019-08-24: 15 ug/min via INTRAVENOUS
  Filled 2019-08-24: qty 250

## 2019-08-24 MED ORDER — PHENYLEPHRINE HCL-NACL 10-0.9 MG/250ML-% IV SOLN
INTRAVENOUS | Status: AC
  Filled 2019-08-24: qty 250

## 2019-08-24 MED ORDER — DEXAMETHASONE SODIUM PHOSPHATE 4 MG/ML IJ SOLN
4.0000 mg | Freq: Four times a day (QID) | INTRAMUSCULAR | Status: DC
  Administered 2019-08-24 – 2019-08-28 (×15): 4 mg via INTRAVENOUS
  Filled 2019-08-24 (×15): qty 1

## 2019-08-24 MED ORDER — FENTANYL CITRATE (PF) 100 MCG/2ML IJ SOLN
50.0000 ug | Freq: Once | INTRAMUSCULAR | Status: AC
  Administered 2019-08-24: 50 ug via INTRAVENOUS

## 2019-08-24 MED ORDER — PRO-STAT SUGAR FREE PO LIQD
30.0000 mL | Freq: Three times a day (TID) | ORAL | Status: DC
  Administered 2019-08-24 – 2019-08-28 (×11): 30 mL
  Filled 2019-08-24 (×11): qty 30

## 2019-08-24 MED ORDER — FENTANYL CITRATE (PF) 100 MCG/2ML IJ SOLN
INTRAMUSCULAR | Status: AC
  Filled 2019-08-24: qty 2

## 2019-08-24 MED ORDER — MIDAZOLAM HCL 2 MG/2ML IJ SOLN
2.0000 mg | Freq: Once | INTRAMUSCULAR | Status: AC
  Administered 2019-08-24: 2 mg via INTRAVENOUS

## 2019-08-24 MED ORDER — DEXAMETHASONE SODIUM PHOSPHATE 10 MG/ML IJ SOLN
10.0000 mg | Freq: Once | INTRAMUSCULAR | Status: AC
  Administered 2019-08-24: 10 mg via INTRAVENOUS
  Filled 2019-08-24 (×2): qty 1

## 2019-08-24 MED ORDER — STERILE WATER FOR INJECTION IJ SOLN
INTRAMUSCULAR | Status: AC
  Filled 2019-08-24: qty 10

## 2019-08-24 MED ORDER — MIDAZOLAM HCL 2 MG/2ML IJ SOLN
INTRAMUSCULAR | Status: AC
  Filled 2019-08-24: qty 2

## 2019-08-24 MED ORDER — ETOMIDATE 2 MG/ML IV SOLN
10.0000 mg | Freq: Once | INTRAVENOUS | Status: AC
  Administered 2019-08-24: 10 mg via INTRAVENOUS

## 2019-08-24 MED ORDER — DEXMEDETOMIDINE HCL IN NACL 400 MCG/100ML IV SOLN
0.4000 ug/kg/h | INTRAVENOUS | Status: DC
  Administered 2019-08-24: 1.2 ug/kg/h via INTRAVENOUS
  Administered 2019-08-25: 0.502 ug/kg/h via INTRAVENOUS
  Administered 2019-08-26: 0.2 ug/kg/h via INTRAVENOUS
  Administered 2019-08-26: 0.4 ug/kg/h via INTRAVENOUS
  Administered 2019-08-27: 0.3 ug/kg/h via INTRAVENOUS
  Filled 2019-08-24 (×6): qty 100

## 2019-08-24 MED ORDER — PHENYLEPHRINE HCL-NACL 10-0.9 MG/250ML-% IV SOLN
0.0000 ug/min | INTRAVENOUS | Status: DC
  Administered 2019-08-24 – 2019-08-25 (×4): 55 ug/min via INTRAVENOUS
  Filled 2019-08-24 (×3): qty 250

## 2019-08-24 MED ORDER — ORAL CARE MOUTH RINSE
15.0000 mL | OROMUCOSAL | Status: DC
  Administered 2019-08-24 – 2019-08-28 (×37): 15 mL via OROMUCOSAL

## 2019-08-24 MED ORDER — POTASSIUM CHLORIDE 10 MEQ/50ML IV SOLN
10.0000 meq | INTRAVENOUS | Status: AC
  Administered 2019-08-24 (×2): 10 meq via INTRAVENOUS
  Filled 2019-08-24 (×2): qty 50

## 2019-08-24 MED ORDER — LEVETIRACETAM 100 MG/ML PO SOLN
500.0000 mg | Freq: Two times a day (BID) | ORAL | Status: DC
  Administered 2019-08-24 – 2019-08-28 (×9): 500 mg
  Filled 2019-08-24 (×10): qty 5

## 2019-08-24 MED FILL — Fentanyl Citrate Preservative Free (PF) Inj 2500 MCG/50ML: INTRAMUSCULAR | Qty: 50 | Status: AC

## 2019-08-24 MED FILL — Sodium Chloride IV Soln 0.9%: INTRAVENOUS | Qty: 250 | Status: AC

## 2019-08-24 NOTE — Progress Notes (Signed)
Assisted CCM MD with bedside bronch without complications. Tooth was found in airway and the decision by MD was made to exchange the ETT from 7.5 to 8.5 to have a large diameter to allow the tooth to be collect out of lung. MD was able to collect the tooth out of the airway through bronch and basket attachment, however could not pass the tooth through the diameter of a 8.5 ETT. 8.5 ETT was taken out and pt was safely reintubated with 7.5 ETT after the tooth removal. Pt is stable at this time. RT will monitor. ABG will follow.

## 2019-08-24 NOTE — Progress Notes (Signed)
eLink Physician-Brief Progress Note Patient Name: Philip Wells DOB: 1955-10-30 MRN: [id number]   Date of Service  08/24/2019  HPI/Events of Note  Pt feels more short of breath, Pulse ox not reliably measuring his saturation.  eICU Interventions  Will obtain an ABG to assess his respiratory status.        Thomasene Lot Deyra Perdomo 08/24/2019, 3:09 AM

## 2019-08-24 NOTE — Consult Note (Addendum)
Consultation Note Date:  08/29/19  Patient Name: Philip Wells  DOB: October 29, 1955  MRN: [id number]  Age / Sex: 64 y.o., male  PCP: Patient, No Pcp Per Referring Physician: Kipp Brood, MD  Reason for Consultation: Establishing goals of care  HPI/Patient Profile: 64 y.o. male  with past medical history of unknown medical history admitted on 08/22/2019 with PEA cardiac arrest receiving ROSC after 6 rounds of CPR followed by hypotension, unresponsiveness, atrial fibrillation RVR with septic shock due to Serratia pneumonia. He has been extubated and off vasopressors. Concern for aspiration with increasing oxygen requirements and a tooth was extracted from his lungs via bronchoscopy 08/24/19 and was extubated again 08/27/19. Repeat right thoracentesis 08/29/19.   Clinical Assessment and Goals of Care: I met today with [name] and no family at bedside. [name] is pleasant but not very conversant. He is unable to give any details to his health issues but does seem to understand that he has been critically ill. He tells me that he lives with "a friend" and that he has a son and daughter. He tells me he has many siblings. I told him that Hassan Rowan has been in contact with his medical team as he has been sick.   I briefly reviewed with him that he has been very ill with concerns for his heart, lungs, and kidneys. He is improving but he has a long ways to go. He expresses motivation to work hard to get back to life and work hard to gain strength. I reassured him that this is our goal but also wanted to ask him if something were to happen would he want to be placed back on a breathing machine. He initially tells me "No! Never!" but then when I went to clarify this with him if he knew what this could mean he tells me "I don't want to talk about this." I respect his decision and told him we just want to make sure that we follow  his wishes whatever they may be. He became frustrated and shut down quickly. He did not want me to contact his family.   I reassure him that we would continue to support him. Emotional support provided.   Primary Decision Maker PATIENT - he seems to be able to make decisions for himself. Only other family contact available is sister, Hassan Rowan.     SUMMARY OF RECOMMENDATIONS   - Continue with hopes of improvement - Will continue to try and clarify goals of care and his wishes - he shut down when discussing today so his wishes remain unclear.   Code Status/Advance Care Planning:  Limited code - intubation/BiPAP only (no changes as he did not clarify)   Symptom Management:   Per primary and PCCM  Palliative Prophylaxis:   Aspiration, Delirium Protocol, Frequent Pain Assessment, Oral Care and Turn Reposition  Psycho-social/Spiritual:   Desire for further Chaplaincy support:no  Prognosis:   Unable to determine - still recovering from critical illness and arrest - still tenuous  Discharge Planning: To Be  Determined      Primary Diagnoses: Present on Admission:  Cardiac arrest [facility])   I have reviewed the medical record, interviewed the patient and family, and examined the patient. The following aspects are pertinent.  History reviewed. No pertinent past medical history. Social History   Socioeconomic History   Marital status: Single    Spouse name: Not on file   Number of children: Not on file   Years of education: Not on file   Highest education level: Not on file  Occupational History   Not on file  Tobacco Use   Smoking status: Not on file  Substance and Sexual Activity   Alcohol use: Not on file   Drug use: Not on file   Sexual activity: Not on file  Other Topics Concern   Not on file  Social History Narrative   Not on file   Social Determinants of Health   Financial Resource Strain:    Difficulty of Paying Living Expenses: Not on file    Food Insecurity:    Worried About [place] in the Last Year: Not on file   Ran Out of Food in the Last Year: Not on file  Transportation Needs:    Lack of Transportation (Medical): Not on file   Lack of Transportation (Non-Medical): Not on file  Physical Activity:    Days of Exercise per Week: Not on file   Minutes of Exercise per Session: Not on file  Stress:    Feeling of Stress : Not on file  Social Connections:    Frequency of Communication with Friends and Family: Not on file   Frequency of Social Gatherings with Friends and Family: Not on file   Attends Religious Services: Not on file   Active Member of Clubs or Organizations: Not on file   Attends Archivist Meetings: Not on file   Marital Status: Not on file   History reviewed. No pertinent family history. Scheduled Meds:  amiodarone  200 mg Oral Daily   Chlorhexidine Gluconate Cloth  6 each Topical Daily   feeding supplement (PRO-STAT SUGAR FREE 64)  30 mL Per Tube Daily   free water  200 mL Per Tube Q6H   heparin  5,000 Units Subcutaneous Q8H   insulin aspart  0-9 Units Subcutaneous Q4H   pantoprazole sodium  40 mg Per Tube Daily   potassium chloride  40 mEq Oral BID   QUEtiapine  50 mg Per Tube BID   sodium chloride flush  10-40 mL Intracatheter Q12H   Continuous Infusions:  sodium chloride 10 mL/hr at 08/24/19 0700   dextrose Stopped (08/20/19 2135)   diltiazem (CARDIZEM) infusion 12.5 mg/hr (08/24/19 0700)   feeding supplement (VITAL 1.5 CAL) 40 mL/hr at 08/24/19 0159   furosemide 100 mg (08/24/19 0815)   levETIRAcetam Stopped (08/24/19 0208)   PRN Meds:.sodium chloride, acetaminophen, sodium chloride flush No Known Allergies Review of Systems  Unable to perform ROS: Other  Constitutional:       Confused at times and difficult to understand at times    Physical Exam Nursing note reviewed.  Constitutional:      Appearance: He is ill-appearing.      Comments: Temporal muscle wasting  Cardiovascular:     Rate and Rhythm: Normal rate and regular rhythm.  Pulmonary:     Effort: No tachypnea, accessory muscle usage or respiratory distress.     Comments: Chest tube x 2 left with serosanguinous drainage Abdominal:  Palpations: Abdomen is soft.  Neurological:     Mental Status: He is alert.     Comments: Mostly oriented - oriented to person, place but thinks the year is 2022     Vital Signs: BP 130/61    Pulse 90    Temp 98.1 F (36.7 C) (Axillary)    Resp (!) 38    Ht _0  (1.803 m)    Wt 74.9 kg    SpO2 92%    BMI 23.03 kg/m  Pain Scale: 0-10   Pain Score: 0-No pain   SpO2: SpO2: 92 % O2 Device:SpO2: 92 % O2 Flow Rate: .O2 Flow Rate (L/min): (S) 40 L/min  IO: Intake/output summary:   Intake/Output Summary (Last 24 hours) at 08/24/2019 0355 Last data filed at 08/24/2019 0700 Gross per 24 hour  Intake 1405.95 ml  Output 4072 ml  Net -2666.05 ml    LBM: Last BM Date: 08/23/19 Baseline Weight: Weight: 57.7 kg Most recent weight: Weight: 74.9 kg     Palliative Assessment/Data:     Time In: 1400 Time Out: 1500 Time Total: 60 min Greater than 50%  of this time was spent counseling and coordinating care related to the above assessment and plan.  Signed by: Vinie Sill, NP Palliative Medicine Team Pager # [phone] (M-F 8a-5p) Team Phone # [phone] (Nights/Weekends)

## 2019-08-24 NOTE — Progress Notes (Signed)
NAME:  Philip Wells, MRN:  [id number], DOB:  [birth year], LOS: 8 ADMISSION DATE:  09/11/2019, CONSULTATION DATE:  08/26/2019 REFERRING MD:  ED - [facility], CHIEF COMPLAINT:  Status post cardiac arrest.   Brief History   [name] with PEA cardiac arrest and subsequent distributive shock, presumably due to sepsis.  History of present illness   HPI obtained from sister at bedside, [name] [phone].  Patient's initial registration was different [employer].  Sister verifies patient's DOB of 08-15-1955. MRN [id number] is not the correct patient.   Per sister, patient is 33 year AA male with no known past medical history. NKDA, no known medications, non smoker, unknown drug history, and does drink wine. Patient lives with his girlfriend, [name] in [place].  Story is unclear, but he was found unresponsive, unclear time down.  CPR performed by Fire department, reportedly 6 rounds of CPR with non-shockable rhythm prior to [place] with [facility] airway placed.    In ER, patient remained unresponsive, initial temp 33 degree celsius, initially normotensive, hypoglycemia, and in atrial tachycardia.  [facility] airway replaced by ETT in ER.    Initially in coded by EMS as possible STEMI, however on cardiology eval in ER, initial rhythm showing aflutter with ratge 140s and STE in V3-4 which had resolved on arrival to ER.  Placed on cardizem gtt for rate control.      Past Medical History  ETOH use- wine  Significant Hospital Events   2/2 admitted with undifferentiated shock following cardiac arrest 2/4 continued pressor requirements.  Completed 36 degree TTM 2/6 more awake today.  Weaning pressor requirements. 2/7 Off pressors 2/8 extubated   Consults:  Cardiology - Patient R/O for STEMI  Procedures:  2/2 ETT >>08/22/2019 2/2 foley >> 2/2 LSC CVL >> replaced 2/5 08/27/2019 left chest tube>> 2/3 left Chest tube #2 >> 08/22/2019 left radial A-line>>  Significant Diagnostic Tests:    2/2 CTH >d severe atrophy but no hemorrhage.  2/3 Renal US > Increased echogenicity consistent with medical renal disease  2/3 Echocardiogram> normal EF and Grade 2 diastolic dysfunction.  2/4 EEG shows diffuse encephalopathy but no seizures  210 CXR (personally reviewed) right lower lobe chest infiltrate. Increased opacity right side. No pneumothorax left chest.  Micro Data:  2/2 SARS 2 / Flu A/b >> neg 2/2 BCx 2 >> No growth @ 4 days 2/2 UC >>  2/2 trach asp >> moderate Serratia marcescens  Antimicrobials:  2/2 vanc >> 2/2 cefepime >>  Interim history/subjective:  Increased O2 requirements overnight, diltiazem added for AF rate control.  More dyspneic with ++ secretions on deep laryngeal suctioning.  Objective   Blood pressure 130/61, pulse 90, temperature 98.1 F (36.7 C), temperature source Axillary, resp. rate (!) 38, height 5' 11"  (1.803 m), weight 74.9 kg, SpO2 92 %.    FiO2 (%):  [100 %] 100 %   Intake/Output Summary (Last 24 hours) at 08/24/2019 0910 Last data filed at 08/24/2019 0700 Gross per 24 hour  Intake 1405.95 ml  Output 4072 ml  Net -2666.05 ml   Filed Weights   08/22/19 0500 08/23/19 0500 08/24/19 0320  Weight: 74.9 kg 73.3 kg 74.9 kg    Examination:  General: Elderly male of normal body habitus supine in bed on heated high flow. HEENT: NCAT, temporal wasting, Cor[employer]k feeding tuibe Neuro: Follows commands, nods understanding, oriented to self  CV: S1, S2, RRR, No RMG, Tachy per tele at 120 PULM: Bilateral chest excursion, increased respiratory distress. Vesicular  breath sounds throughout. GI: soft, non distended, active bowel sounds GU: Foley intact and draining clear amber urine Extremities: warm/dry, 3+  peripheral edema. Skin: no rashes or lesions, warm dry and intact   Resolved Hospital Problem list   Cardiac arrest   Assessment & Plan:   Cardiac Arrest - unclear etiology.  Tachy with frequent PAC's Doesn't appear to be primary  cardiac or PE given relatively normal TTE.  ST with Frequent PAC's Plan - Continue Amiodarone as ordered - DNR if re-arrests    Was critically ill due to undifferentiated shock:  Questionably septic shock given GPC on tracheal aspirate, leukocytosis, and PCT 40.  Echo with LVEF 50% and no improvement with PTC reexpansion.  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 Did require sedation to control agitation.  - prognosis for meaningful neurological recovery is guarded given degree of cerebral atrophy, however increasing level of awareness and interactivity is encouraging  - Off precedex and all sedation, awake and oriented to self and place Plan - Supportive - Monitor for any neurological changes - Minimize sedation    Was critically ill due to acute hypoxemic respiratory failure requiring mechanical ventilation Pt extubated 2/8, on HFNC  Increasing Right Pleural effusion per CXR Aspirated 2/9 am with swallow eval Now requiring additional oxygen, RR 30 and sats 89-93% Plan CXR now>> Increasing R pleural effusion Consider Thoracentesis Failed Swallow 2/9 Aggressive Pulmonary Toilet PT/OT for ambulation OOB to chair IS Q 2 Continue chest PT Q 4 Thoracentesis today.  Presumed Serratia marcescens pneumonia.   Likely cause of sepsis. Continued Leukocytosis, but improving -Complete course of antibiotics for 7 days. - Trend fever and WBC - Re culture as is clinically indicated  Left sided hydropneumothorax:  - CT placed 2/2 (With 1L fluid drained),  - worsening PTX on 2/3 so additional tube placed CT #1 no leak and draining CT #2 1 chamber leak and no drainage Plan - Chest tubes to water seal.  Critically ill due New onset Afib with RVR: converted with amio - Continue amiodarone - Monitor  electrolytes - Will check Mag and replete as needed - Calcium corrects with albumin - Will check ionized calcium in am    AKI UOP improving,  Remains + 13 L Cre continues rising Hypo Mag Calcium corrects with albumin Plan Trend BMET Consider holding lasix  Maintain renal perfusion Avoid nephrotoxic medications Replete electrolytes as needed   Mild Transaminitis-  ? Shock vs ETOH hx -continues to improve Plan - Continue to monitor LFT and coagulations - PT/INR 2/10 am  Goals of Care Pt is a DNR. I am concerned with his recent aspiration and increased need for oxygen support  ( 10 L)  that he may continue to decompensate from a respiratory standpoint.  Additionally his renal function is worsening . I will consult Palliative care for a clear plan of care to support the DNR Goals of care    Daily Goals Checklist  Pain/Anxiety/Delirium protocol (if indicated): prn VAP protocol (if indicated): bundle in place. Respiratory support goals: Extubated. Blood pressure target: Keep MAP>65 DVT prophylaxis: BID UFH Nutrition Status: TF, NPO Nutrition Problem: Moderate Malnutrition Etiology: chronic illness, social / environmental circumstances Signs/Symptoms: mild fat depletion, moderate muscle depletion Interventions: Tube feeding on hold due to high residual.  Core track tomorrow. GI prophylaxis: Protonix  bid Urinary catheter: Guide hemodynamic management Glucose control: episodes of hypoglycemia Mobility/therapy needs: OOB to chair Antibiotic de-escalation: continue current antibiotics for 7 days Code Status: DNR Family Communication:  Sister updated yesterday.  Disposition: ICU for now   Labs   CBC: Recent Labs  Lab 08/20/19 0322 08/20/19 0322 08/21/19 0345 08/22/19 0355 08/23/19 0500 08/24/19 0326 08/24/19 0341  WBC 16.7*  --  19.0* 21.4* 15.9* 17.3*  --   NEUTROABS 13.4*  --  16.7* 18.7* 13.7* 14.9*  --   HGB 11.4*   < > 10.2* 11.8* 11.6* 11.8* 12.2*  HCT  33.6*   < > 29.3* 34.2* 33.4* 33.5* 36.0*  MCV 96.3  --  92.4 92.2 90.8 91.0  --   PLT 201  --  144* 179 172 162  --    < > = values in this interval not displayed.    Basic Metabolic Panel: Recent Labs  Lab 08/18/19 0457 08/18/19 0457 08/18/19 1630 08/18/19 2333 08/19/19 0500 08/19/19 0500 08/19/19 0750 08/19/19 1600 08/20/19 0322 08/20/19 0322 08/22/19 0355 08/23/19 0500 08/23/19 1236 08/24/19 0326 08/24/19 0341  NA 140  --   --    < > 140   < >   < >  --  141   < > 145 143 142 144 144  K 4.2  --   --    < > 3.8   < >   < >  --  4.2   < > 3.6 3.3* 3.2* 3.5 3.4*  CL 108   < >  --   --  101   < >  --   --  101  --  97* 100 100 103  --   CO2 19*   < >  --   --  30   < >  --   --  29  --  31 29 29 30   --   GLUCOSE 169*   < >  --   --  188*   < >  --   --  126*  --  80 78 131* 151*  --   BUN 39*   < >  --   --  43*   < >  --   --  47*  --  67* 82* 81* 83*  --   CREATININE 2.38*   < >  --   --  2.49*   < >  --   --  2.62*  --  3.19* 3.83* 3.89* 3.92*  --   CALCIUM 6.1*   < >  --   --  6.4*   < >  --   --  6.8*  --  8.0* 8.2* 8.1* 8.2*  --   MG 1.6*   < > 2.3  --  1.9  --   --  1.9  --   --   --   --  1.9 1.8  --   PHOS 4.0  --  2.9  --  3.2  --   --  4.4  --   --   --   --   --   --   --    < > = values in this interval not displayed.   GFR: Estimated Creatinine Clearance: 20.2 mL/min (A) (by C-G formula based on SCr of 3.92 mg/dL (H)). Recent Labs  Lab 08/17/19 1000 08/17/19 2015 08/17/19 2035 08/17/19 2315 08/18/19 0457 08/18/19 0457 08/19/19 0500 08/20/19 0322 08/21/19 0345 08/22/19 0355 08/23/19  0500 08/24/19 0326  PROCALCITON 72.79  --   --   --  41.05  --  38.90  --   --   --   --   --   WBC  --    < >  --   --  13.2*   < > 13.1*   < > 19.0* 21.4* 15.9* 17.3*  LATICACIDVEN  --   --  6.5* 8.9*  --   --   --   --   --   --   --   --    < > = values in this interval not displayed.    Liver Function Tests: Recent Labs  Lab 08/19/19 0500 08/20/19 0322  08/22/19 0355 08/23/19 0500 08/24/19 0326  AST 653* 499* 292* 273* 284*  ALT 248* 224* 142* 132* 134*  ALKPHOS 57 98 142* 164* 163*  BILITOT 0.7 1.5* 1.1 1.2 0.8  PROT 4.1* 4.8* 4.7* 5.2* 5.0*  ALBUMIN 1.1* 1.2* 1.2* 1.4* 1.3*   No results for input(s): LIPASE, AMYLASE in the last 168 hours. No results for input(s): AMMONIA in the last 168 hours.  ABG    Component Value Date/Time   PHART 7.526 (H) 08/24/2019 0341   PCO2ART 37.4 08/24/2019 0341   PO2ART 53.0 (L) 08/24/2019 0341   HCO3 30.9 (H) 08/24/2019 0341   TCO2 32 08/24/2019 0341   ACIDBASEDEF 5.0 (H) 08/17/2019 2152   O2SAT 91.0 08/24/2019 0341     Coagulation Profile: Recent Labs  Lab 08/24/19 0326  INR 1.2    Cardiac Enzymes: No results for input(s): CKTOTAL, CKMB, CKMBINDEX, TROPONINI in the last 168 hours.  HbA1C: Hgb A1c MFr Bld  Date/Time Value Ref Range Status  08/19/2019 01:16 AM 5.3 4.8 - 5.6 % Final    Comment:    (NOTE) Pre diabetes:          5.7%-6.4% Diabetes:              >6.4% Glycemic control for   <7.0% adults with diabetes     CBG: Recent Labs  Lab 08/23/19 2031 08/23/19 2305 08/24/19 0324 08/24/19 0340 08/24/19 0757  GLUCAP 176* 148* 128* 154* 122*   PCCM APP Time 45 minutes   Magdalen Spatz, MSN, AGACNP-BC [place] Pager # [phone] After 4 pm please call [phone] 08/24/2019  9:10 AM

## 2019-08-24 NOTE — Progress Notes (Signed)
Elink MD notified regarding pt's respiratory status. Pt complaining of shortness of breath. Oxygen saturation not reading on any oxygen probe. Pt complaining of feeling tired, has weak cough, and thick tan secretions. Awaiting further orders

## 2019-08-24 NOTE — Progress Notes (Signed)
OT Cancellation Note  Patient Details Name: Philip Wells MRN: [id number] DOB: 1956/01/06   Cancelled Treatment:    Reason Eval/Treat Not Completed: Patient not medically ready(pending procedure for chest tube) ( R pleural effusion noted in latest progress note)  Wynona Neat, OTR/L  Acute Rehabilitation Services Pager: [phone] Office: [phone] .  08/24/2019, 9:46 AM

## 2019-08-24 NOTE — Progress Notes (Signed)
Palliative:  I have received consult. Came to see patient and he was having thoracentesis and then later today he has been re-intubated. I discussed with RN who reports that patient was encephalopathic and would have been unable to have goals of care conversation even prior to procedures. I will reach out to family to further discuss tomorrow.   No charge  Yong Channel, NP Palliative Medicine Team Pager [phone] (Please see [email or web address] for schedule) Team Phone [phone]

## 2019-08-24 NOTE — Plan of Care (Signed)
Pt is alert and oriented to place and person. Pt follows commands and is able to respond appropriately when explained things or asked a question. Pt's heart rate has increased throughout the night and went from ST to AFIB/RVR, elink MD notified and orders received. Pt is now on a cardizem gtt. Pt's blood pressure has decreased slightly but overall, he has tolerate gtt. Pt sounds rhonchus and diminished, pt has been able to cough up some secretions but they are thick. Pt has been given oral care every hour and oxygen probe has been changed many times throughout the night. Oxygen saturation for the most part is in the 90s but oxygen probe has not read the pt's oxygen consistently. Pt has complained of shortness of breath early this morning and states, "I am tired." Pt was placed on heated high flow to help with oxygen (low in ABG) and help with secretions. Pt has generalized skin tears in various stages of healing. Pt's fingernails are dark, especially on the left hand. Next tube feed change is due at 10 am. Pt has had adequate urine output throughout the night and had one bowel movement, type 7. Call bell is within reach and bed is in lowest position.  Problem: Nutrition: Goal: Adequate nutrition will be maintained Outcome: Progressing   Problem: Coping: Goal: Level of anxiety will decrease Outcome: Progressing   Problem: Elimination: Goal: Will not experience complications related to bowel motility Outcome: Progressing Goal: Will not experience complications related to urinary retention Outcome: Progressing   Problem: Pain Managment: Goal: General experience of comfort will improve Outcome: Progressing

## 2019-08-24 NOTE — Procedures (Signed)
Bronchoscopy Procedure Note Philip Wells [id number] [birth year]  Procedure: Bronchoscopy Indications: removal of foreign body (tooth)  Procedure Details Consent: Unable to obtain consent because of emergent medical necessity. Time Out: Verified patient identification, verified procedure, site/side was marked, verified correct patient position, special equipment/implants available, medications/allergies/relevent history reviewed, required imaging and test results available.  Performed  In preparation for procedure, patient was given 100% FiO2 and bronchoscope lubricated. Sedation: Muscle relaxants, Etomidate and Fentanyl   Airway entered and the following bronchi were examined: RUL, RML, RLL, LUL, LLL and Bronchi.   Procedures performed:   Initial procedure performed with Ambuscope. Clear secretions removed from airway. No endobronchial lesions on the left side. Right mainstem appears to have possible external compression.  Tooth found obstructing bronchus intermedius.  Switched to Olympus scope and ETT exchanged for 8.32mm to possible facilitate removal of tooth through ETT.   Tooth snared with basket and removed en bloc with ETT as captured sideways and unable to pull through ETT.   Reintubated with 7.23mm tube. Steroid started for possible laryngeal edema from instrumentation.       Bronchoscope removed.    Evaluation Hemodynamic Status: BP stable throughout; O2 sats: stable throughout Patient's Current Condition: stable Specimens:  Sent purulent fluid Complications: No apparent complications Patient did tolerate procedure well.   Daleen Bo Philip Wells 08/24/2019

## 2019-08-24 NOTE — Procedures (Signed)
Thoracentesis Procedure Note  Pre-operative Diagnosis: right pleural effusion.  Post-operative Diagnosis: same  Indications: respiratory distress  Procedure Details  Consent: Informed consent was obtained. Risks of the procedure were discussed including: infection, bleeding, pain, pneumothorax.  Under sterile conditions the patient was positioned. Betadine solution and sterile drapes were utilized.  1% buffered lidocaine was used to anesthetize the 4 rib space. Fluid was obtained without any difficulties and minimal blood loss.  A dressing was applied to the wound and wound care instructions were provided.   Findings 600 ml of clear pleural fluid was obtained. A sample was sent to Pathology for cytogenetics, flow, and cell counts, as well as for infection analysis.  Complications:  None; patient tolerated the procedure well.          Condition: stable  Plan A follow up chest x-ray was ordered. Bed Rest for 0 hours. Tylenol 650 mg. for pain.  Attending Attestation: I performed the procedure.  Lynnell Catalan, MD [facility] ICU Physician [facility]  Pager: [phone] Mobile: [phone] After hours: [phone].  08/24/2019, 9:18 AM

## 2019-08-24 NOTE — Progress Notes (Signed)
Arterial blood gas drawn on 15 LPM High Flow Salter Cannula.

## 2019-08-24 NOTE — Progress Notes (Signed)
PT Cancellation Note  Patient Details Name: Philip Wells MRN: [id number] DOB: 15-Mar-1956   Cancelled Treatment:    PT attempted to see patient however upon arrival pt with recent chest tube placement, RN requesting PT return later in afternoon. PT returns in afternoon, RN requesting deferment of PT services after pt undergoing bronch. PT will attempt to follow up per PT POC.   Arlyss Gandy 08/24/2019, 3:47 PM

## 2019-08-24 NOTE — Progress Notes (Signed)
eLink Physician-Brief Progress Note Patient Name: Philip Wells DOB: 12-Feb-1956 MRN: [id number]   Date of Service  08/24/2019  HPI/Events of Note  K+ 3.2  eICU Interventions  KCL 10 meq iv Q 1 hour x 2 doses        Philip Wells 08/24/2019, 12:26 AM

## 2019-08-24 NOTE — Progress Notes (Signed)
SLP Cancellation Note  Patient Details Name: Philip Wells MRN: [id number] DOB: 18-Oct-1955   Cancelled treatment:       Reason Eval/Treat Not Completed: Other (comment);Medical issues which prohibited therapy(pt with Cortak in place, appears with rapid respirations, Will continue efforts.)   [name] 08/24/2019, 9:22 AM [name], MS [facility] SLP Acute Rehab Services Office [phone]

## 2019-08-24 NOTE — Progress Notes (Signed)
eLink Physician-Brief Progress Note Patient Name: Philip Wells Charlie DOB: [birth year] MRN: [id number]   Date of Service  08/24/2019  HPI/Events of Note  PO2 53  eICU Interventions  Heated high flow oxygen  Via nasal cannula ordered     Intervention Category Major Interventions: Hypoxemia - evaluation and management  Migdalia Dk 08/24/2019, 3:50 AM

## 2019-08-24 NOTE — Progress Notes (Signed)
Nutrition Follow-up  DOCUMENTATION CODES:   Non-severe (moderate) malnutrition in context of chronic illness  INTERVENTION:   Tube Feeding:  Vital 1.5 at 20 ml/hr via Cortrak  Increase by 10 ml Q4 hours to goal rate of 40 ml/hr (960 ml) Pro-Stat 30 mL TID Provides 1740 kcals, 110 g of protein and 733 mL of free water Meets 100% estimated calorie and protein needs  Recommend 200 mL free water q 6 hours: 1712 mL of free water  NUTRITION DIAGNOSIS:   Moderate Malnutrition related to chronic illness, social / environmental circumstances as evidenced by mild fat depletion, moderate muscle depletion.  Being addressed via TF   GOAL:   Patient will meet greater than or equal to 90% of their needs  Progressing  MONITOR:   TF tolerance, Labs, Weight trends, Diet advancement  REASON FOR ASSESSMENT:   Consult, Ventilator Enteral/tube feeding initiation and management  ASSESSMENT:   64 yo male admitted post cardiac arrest with nonshockable rhythm with estimated downtime of 30 minutes, acute respiratory failure requiring intubation, shock, ARF with metabolic acidosis and oliguria. PMH includes EtOH use (negative EtOH on admission)  2/02 Admitted, Intubated, TTM 2/04 TTM complete 2/08 Extubated 2/09 Post-pyloric Cortrak placed 2/10 Re-intubated, s/p thoracentesis-600 ml drained    Pt discussed during ICU rounds and with RN.   Pt re-intubated today. Tooth found in airway during broch. Cortrak remains in good position. Will restart TF and titrate back to goal. Had BM today.   Pt remains volume overloaded (+2 generalized edema). Continues to diurese.   Admission weight: 57.7 kg  Current weight: 74.9 kg   Patient is currently intubated on ventilator support MV: 10.2 L/min Temp (24hrs), Avg:97.8 F (36.6 C), Min:97.6 F (36.4 C), Max:98.1 F (36.7 C)   I/O: +11, 059 ml since admit  UOP: 3,890 ml x 24 hrs Chest tubes: 340 ml x 24 hrs    Drips: precedex, 100 mg lasix in  D5 Medications: decadron, SS novolog Labs: K 3.4 (L) LFTs elevated Cr 3.92- trending up   Diet Order:   Diet Order    None      EDUCATION NEEDS:   Not appropriate for education at this time  Skin:  Skin Assessment: Skin Integrity Issues: Skin Integrity Issues:: Stage I Stage I: head  Last BM:  2/9  Height:   Ht Readings from Last 1 Encounters:  28-Aug-2019 5\' 11"  (1.803 m)    Weight:   Wt Readings from Last 1 Encounters:  08/24/19 74.9 kg    BMI:  Body mass index is 23.03 kg/m.  Estimated Nutritional Needs:   Kcal:  1715 kcal  Protein:  90-115 grams  Fluid:  >/= 1.7 L/day   10/22/19 RD, LDN Clinical Nutrition Pager listed in AMION

## 2019-08-24 NOTE — Progress Notes (Signed)
Pola Corn MD notified regarding pt's PO2 OF 53. Awaiting further orders

## 2019-08-25 ENCOUNTER — Encounter (HOSPITAL_COMMUNITY): Payer: Self-pay | Admitting: Pulmonary Disease

## 2019-08-25 ENCOUNTER — Other Ambulatory Visit: Payer: Self-pay

## 2019-08-25 ENCOUNTER — Inpatient Hospital Stay (HOSPITAL_COMMUNITY): Payer: Self-pay

## 2019-08-25 LAB — BASIC METABOLIC PANEL
Anion gap: 11 (ref 5–15)
Anion gap: 12 (ref 5–15)
BUN: 90 mg/dL — ABNORMAL HIGH (ref 8–23)
BUN: 103 mg/dL — ABNORMAL HIGH (ref 8–23)
CO2: 31 mmol/L (ref 22–32)
CO2: 30 mmol/L (ref 22–32)
Calcium: 8.2 mg/dL — ABNORMAL LOW (ref 8.9–10.3)
Calcium: 8.4 mg/dL — ABNORMAL LOW (ref 8.9–10.3)
Chloride: 103 mmol/L (ref 98–111)
Chloride: 102 mmol/L (ref 98–111)
Creatinine, Ser: 4.11 mg/dL — ABNORMAL HIGH (ref 0.61–1.24)
Creatinine, Ser: 4.24 mg/dL — ABNORMAL HIGH (ref 0.61–1.24)
GFR calc Af Amer: 17 mL/min — ABNORMAL LOW (ref >60)
GFR calc Af Amer: 16 mL/min — ABNORMAL LOW (ref >60)
GFR calc non Af Amer: 14 mL/min — ABNORMAL LOW (ref >60)
GFR calc non Af Amer: 14 mL/min — ABNORMAL LOW (ref >60)
Glucose, Bld: 148 mg/dL — ABNORMAL HIGH (ref 70–99)
Glucose, Bld: 174 mg/dL — ABNORMAL HIGH (ref 70–99)
Potassium: 4.1 mmol/L (ref 3.5–5.1)
Potassium: 3.5 mmol/L (ref 3.5–5.1)
Sodium: 145 mmol/L (ref 135–145)
Sodium: 144 mmol/L (ref 135–145)

## 2019-08-25 LAB — GLUCOSE, CAPILLARY
Glucose-Capillary: 131 mg/dL — ABNORMAL HIGH (ref 70–99)
Glucose-Capillary: 134 mg/dL — ABNORMAL HIGH (ref 70–99)
Glucose-Capillary: 135 mg/dL — ABNORMAL HIGH (ref 70–99)
Glucose-Capillary: 135 mg/dL — ABNORMAL HIGH (ref 70–99)
Glucose-Capillary: 138 mg/dL — ABNORMAL HIGH (ref 70–99)
Glucose-Capillary: 139 mg/dL — ABNORMAL HIGH (ref 70–99)
Glucose-Capillary: 165 mg/dL — ABNORMAL HIGH (ref 70–99)

## 2019-08-25 LAB — PROTEIN, PLEURAL OR PERITONEAL FLUID: Total protein, fluid: <3 g/dL

## 2019-08-25 LAB — CBC WITH DIFFERENTIAL/PLATELET
Abs Immature Granulocytes: 0.18 10*3/uL — ABNORMAL HIGH (ref 0.00–0.07)
Basophils Absolute: 0 10*3/uL (ref 0.0–0.1)
Basophils Relative: 0 %
Eosinophils Absolute: 0 10*3/uL (ref 0.0–0.5)
Eosinophils Relative: 0 %
HCT: 29.7 % — ABNORMAL LOW (ref 39.0–52.0)
Hemoglobin: 10.3 g/dL — ABNORMAL LOW (ref 13.0–17.0)
Immature Granulocytes: 1 %
Lymphocytes Relative: 2 %
Lymphs Abs: 0.3 10*3/uL — ABNORMAL LOW (ref 0.7–4.0)
MCH: 31.5 pg (ref 26.0–34.0)
MCHC: 34.7 g/dL (ref 30.0–36.0)
MCV: 90.8 fL (ref 80.0–100.0)
Monocytes Absolute: 0.5 10*3/uL (ref 0.1–1.0)
Monocytes Relative: 2 %
Neutro Abs: 22 10*3/uL — ABNORMAL HIGH (ref 1.7–7.7)
Neutrophils Relative %: 95 %
Platelets: 154 10*3/uL (ref 150–400)
RBC: 3.27 MIL/uL — ABNORMAL LOW (ref 4.22–5.81)
RDW: 14 % (ref 11.5–15.5)
WBC: 23 10*3/uL — ABNORMAL HIGH (ref 4.0–10.5)
nRBC: 0.1 % (ref 0.0–0.2)

## 2019-08-25 LAB — CYTOLOGY - NON PAP

## 2019-08-25 LAB — MAGNESIUM: Magnesium: 1.7 mg/dL (ref 1.7–2.4)

## 2019-08-25 MED ORDER — AMIODARONE IV BOLUS ONLY 150 MG/100ML
150.0000 mg | Freq: Once | INTRAVENOUS | Status: DC
  Filled 2019-08-25: qty 100

## 2019-08-25 NOTE — Progress Notes (Signed)
Placed patient back on full support due to A-fib.

## 2019-08-25 NOTE — Progress Notes (Signed)
eLink Physician-Brief Progress Note Patient Name: Philip Wells DOB: Jun 17, 1956 MRN: [id number]   Date of Service  08/25/2019  HPI/Events of Note  AFLutter with RVR - Ventricular rate = 150. Patient is on Amiodarone PO.   eICU Interventions  Will order: 1. Amiodarone 150 mg IV over 10 minutes. 2. BMP and Mg++ level STAT.      Intervention Category Major Interventions: Arrhythmia - evaluation and management  [name] Dennard Nip 08/25/2019, 9:58 PM

## 2019-08-25 NOTE — Progress Notes (Signed)
Palliative:  I spoke with Dr. Denese Killings and he would like to hold on palliative consult for now. I will follow up for needs on Monday 08/29/19.   Yong Channel, NP Palliative Medicine Team Pager [phone] (Please see [email or web address] for schedule) Team Phone [phone]

## 2019-08-25 NOTE — Progress Notes (Signed)
Nursing Progress note  65 year old male post cardiac arrest, code cool 36 degrees completed on 08/18/19. Required bronchoscopy yesterday with the removal of a tooth.  Left intubated and on steroids for airway edema. Chest tubes for left sided hydropneumothorax.    Intake/Output Summary (Last 24 hours) at 08/25/2019 1908 Last data filed at 08/25/2019 1900 Gross per 24 hour  Intake 2360.93 ml  Output 2435 ml  Net -74.07 ml     Today's Vitals   08/25/19 1700 08/25/19 1800 08/25/19 1830 08/25/19 1900  BP: (!) 118/56 (!) 125/55 (!) 122/56 (!) 116/58  Pulse: 94 98 96 94  Resp: (!) 24 (!) 25 (!) 23 (!) 24  Temp:      TempSrc:      SpO2: 95% 96% 95% 95%  Weight:      Height:      PainSc:         Sodium  Date/Time Value Ref Range Status  08/25/2019 04:01 AM 145 135 - 145 mmol/L Final  08/24/2019 12:24 PM 145 135 - 145 mmol/L Final   Potassium  Date/Time Value Ref Range Status  08/25/2019 04:01 AM 4.1 3.5 - 5.1 mmol/L Final  08/24/2019 12:24 PM 3.5 3.5 - 5.1 mmol/L Final   Chloride  Date/Time Value Ref Range Status  08/25/2019 04:01 AM 103 98 - 111 mmol/L Final  08/24/2019 03:26 AM 103 98 - 111 mmol/L Final   CO2  Date/Time Value Ref Range Status  08/25/2019 04:01 AM 31 22 - 32 mmol/L Final  08/24/2019 03:26 AM 30 22 - 32 mmol/L Final   Glucose, Bld  Date/Time Value Ref Range Status  08/25/2019 04:01 AM 148 (H) 70 - 99 mg/dL Final  36/62/9476 54:65 AM 151 (H) 70 - 99 mg/dL Final   BUN  Date/Time Value Ref Range Status  08/25/2019 04:01 AM 90 (H) 8 - 23 mg/dL Final  03/54/6568 12:75 AM 83 (H) 8 - 23 mg/dL Final   Creatinine, Ser  Date/Time Value Ref Range Status  08/25/2019 04:01 AM 4.11 (H) 0.61 - 1.24 mg/dL Final  17/00/1749 44:96 AM 3.92 (H) 0.61 - 1.24 mg/dL Final     WBC  Date/Time Value Ref Range Status  08/25/2019 04:01 AM 23.0 (H) 4.0 - 10.5 K/uL Final  08/24/2019 03:26 AM 17.3 (H) 4.0 - 10.5 K/uL Final   Hemoglobin  Date/Time Value Ref Range Status   08/25/2019 04:01 AM 10.3 (L) 13.0 - 17.0 g/dL Final  75/91/6384 66:59 PM 11.9 (L) 13.0 - 17.0 g/dL Final   Platelets  Date/Time Value Ref Range Status  08/25/2019 04:01 AM 154 150 - 400 K/uL Final    Comment:    REPEATED TO VERIFY  08/24/2019 03:26 AM 162 150 - 400 K/uL Final    Comment:    REPEATED TO VERIFY     Patient did very well weaning from vent.  Flipped to pressure control of 10 over the regular peep of 5 at 9am.  Weaned threoughout the day required minimal doses of precedex for agitation and pulling at tubes and lines. Along with PT patient was assisted to the bedside and then stood.  Patient very shaky on his feet.  No dizziness or problems with hemodynamics or oxygenation.  Repeat xray showed improved hydropneumothorax on the left, discussed with CCM and will leave on suction today and plan to water seal tomorrow pending morning xray. Goals to maintain hemodynamics on the least sedation possible, wean vent to extubate potentially tomorrow. Family called a few times, updated  and time given for questions.

## 2019-08-25 NOTE — Progress Notes (Signed)
SLP Cancellation Note  Patient Details Name: Philip Wells MRN: [id number] DOB: [birth year]   Cancelled treatment:       Reason Eval/Treat Not Completed: Medical issues which prohibited therapy(pt now on vent, please reorder when ready, thanks)   [name] 08/25/2019, 9:55 AM  [name], MS [facility] SLP Acute Rehab Services Office [phone]

## 2019-08-25 NOTE — Progress Notes (Signed)
Physical Therapy Treatment Patient Details Name: Philip Wells MRN: [id number] DOB: 1956-04-23 Today's Date: 08/25/2019    History of Present Illness Patient is a 64 y/o male who presents after unwitnessed cardiac arrest with 6 rounds of CPR and subsequent distributive shock, presumably due to sepsis. Intubated 2/2-2/8. CT insertion x2 on 2/2 and 2/3. CXR 2/6-right lower lobe chest infiltrate. Small residual left-sided PTX. 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 No known PMH. Pt undrwent bronch with extraction of tooth from airway on 2/10, lef intubated after procedure.    PT Comments    Pt tolerated treatment well, intubated and communicating through head nods. Pt requires significant physical assistance to perform all functional mobility at this time, but initiates and provides as much strength as he is able to during mobility attempts. Pt with instance of knee buckling during attempt at standing which was stopped by PT knee block. Pt with impaired motor control at this time, demonstrating very jerky movements with all extremities during session. Pt will benefit from continued acute PT POC to improve functional mobility and reduce falls risk.  Follow Up Recommendations  SNF;Supervision for mobility/OOB     Equipment Recommendations  (defer to post-acute setting)    Recommendations for Other Services       Precautions / Restrictions Precautions Precautions: Fall Precaution Comments: watch o2, intubated, cortrak Restrictions Weight Bearing Restrictions: No    Mobility  Bed Mobility Overal bed mobility: Needs Assistance Bed Mobility: Supine to Sit;Sit to Supine     Supine to sit: Max assist;+2 for physical assistance Sit to supine: Max assist;+2 for physical assistance;+2 for safety/equipment      Transfers Overall transfer level: Needs assistance Equipment used: 2 person hand held assist Transfers: Sit to/from Stand Sit  to Stand: Max assist;+2 safety/equipment;+2 physical assistance;From elevated surface         General transfer comment: pt with instance of knee buckling half way to stand, recovers to complete stand with bilateral knee block  Ambulation/Gait                 Stairs             Wheelchair Mobility    Modified Rankin (Stroke Patients Only)       Balance Overall balance assessment: Needs assistance Sitting-balance support: Bilateral upper extremity supported;Feet unsupported Sitting balance-Leahy Scale: Poor Sitting balance - Comments: modA sitting edge of bed, pt swaying in all directions Postural control: Posterior lean;Right lateral lean;Left lateral lean Standing balance support: Bilateral upper extremity supported Standing balance-Leahy Scale: Zero Standing balance comment: maxA x 2 to stand statically                            Cognition Arousal/Alertness: Awake/alert Behavior During Therapy: Flat affect Overall Cognitive Status: Difficult to assess                                        Exercises General Exercises - Lower Extremity Ankle Circles/Pumps: AROM;Both;10 reps Gluteal Sets: AROM;Both;10 reps Long Arc Quad: AROM;Both;5 reps;Seated Heel Slides: AROM;Both;10 reps Hip ABduction/ADduction: AROM;Both;10 reps    General Comments General comments (skin integrity, edema, etc.): Pt intubated, VSS during activity.      Pertinent Vitals/Pain Pain Assessment: No/denies pain    Home Living  Prior Function            PT Goals (current goals can now be found in the care plan section) Acute Rehab PT Goals Patient Stated Goal: none stated Progress towards PT goals: Progressing toward goals    Frequency    Min 3X/week      PT Plan Current plan remains appropriate    Co-evaluation              AM-PAC PT "6 Clicks" Mobility   Outcome Measure  Help needed turning from your  back to your side while in a flat bed without using bedrails?: Total Help needed moving from lying on your back to sitting on the side of a flat bed without using bedrails?: Total Help needed moving to and from a bed to a chair (including a wheelchair)?: Total Help needed standing up from a chair using your arms (e.g., wheelchair or bedside chair)?: Total Help needed to walk in hospital room?: Total Help needed climbing 3-5 steps with a railing? : Total 6 Click Score: 6    End of Session Equipment Utilized During Treatment: Oxygen Activity Tolerance: Patient tolerated treatment well Patient left: in bed;with call bell/phone within reach;with bed alarm set Nurse Communication: Mobility status PT Visit Diagnosis: Muscle weakness (generalized) (M62.81)     Time: 1696-7893 PT Time Calculation (min) (ACUTE ONLY): 23 min  Charges:  $Therapeutic Exercise: 8-22 mins $Therapeutic Activity: 8-22 mins                     Arlyss Gandy, PT, DPT Acute Rehabilitation Pager: [phone]    Arlyss Gandy 08/25/2019, 1:32 PM

## 2019-08-25 NOTE — Progress Notes (Addendum)
NAME:  Philip Wells, MRN:  [id number], DOB:  Mar 31, 1956, LOS: 9 ADMISSION DATE:  09/09/2019, CONSULTATION DATE:  09/05/2019 REFERRING MD:  ED - [facility], CHIEF COMPLAINT:  Status post cardiac arrest.   Brief History   [name] with PEA cardiac arrest and subsequent distributive shock, presumably due to sepsis, AFwRVR.  STEMI ruled out. [name].     Past Medical History  ETOH use- wine  Significant Hospital Events   2/02 admitted with undifferentiated shock following cardiac arrest 2/04 continued pressor requirements.  Completed 36 degree TTM 2/06 more awake today.  Weaning pressor requirements. 2/07 Off pressors 2/08 extubated  2/10 Reintubated, FOB with tooth removal. Diltiazem added for AF rate control.    Consults:  Cardiology - Patient R/O for STEMI  Procedures:  ETT 2/2 >> 2/8, 2/10 >>  Foley 2/2 >>  L [place] TLC 2/5 >>  L CT 2/2 >>  L CT #2 2/3 >> L Radial Aline 2/2 >>   Significant Diagnostic Tests:   CT Head 2/2 >> severe atrophy but no hemorrhage   Renal US >> increased echogenicity consistent with medical renal disease  ECHO 2/3 >> normal EF, Grade II diastolic dysfunction   EEG 2/4 >> diffuse encephalopathy but no seizures  R Thoracentesis 2/10 >>    Micro Data:  COVID 2/2 >> negatve Flu A/B 2/2 >> negative  UC 2/2 >>  BCx2 2/3 >>  Tracheal Aspirate 2/2 >> moderate serratia marcescens, S-ceftriaxone, cefepime BAL 2/10 >>  R Pleural Fluid 2/10 >>   Antimicrobials:  Vanco 2/2 >> 2/5  Cefepime 2/2/ >> 2/9    Interim history/subjective:  RN reports no acute events overnight. Weaning precedex. Pt turned to PSV 10/5 I/O - 1.9L UOP, net even balance for last 24 hours  Afebrile but WBC increased to 23k (from 17)  Objective   Blood pressure (!) 90/55, pulse 78, temperature 97.6 F (36.4 C), temperature source Oral, resp. rate 16, height _0  (1.803 m), weight 70.9 kg, SpO2 100 %.    Vent Mode: PRVC FiO2 (%):  [40  %-100 %] 40 % Set Rate:  [16 bmp] 16 bmp Vt Set:  [510 mL-600 mL] 600 mL PEEP:  [5 cmH20-10 cmH20] 10 cmH20 Plateau Pressure:  [24 cmH20-31 cmH20] 31 cmH20   Intake/Output Summary (Last 24 hours) at 08/25/2019 0830 Last data filed at 08/25/2019 1062 Gross per 24 hour  Intake 1925.62 ml  Output 2255 ml  Net -329.38 ml   Filed Weights   08/23/19 0500 08/24/19 0320 08/25/19 0352  Weight: 73.3 kg 74.9 kg 70.9 kg    Examination:  General: adult male, appears older than age, lying in bed in NAD on vent    HEENT: MM pink/moist, ETT, pupils =/reactive Neuro: Awakens to voice, nods appropriately, follows commands  CV: s1s2 RRR, frequent PAC's, no m/r/g PULM:  Non-labored on vent, on PSV 10/5, lungs bilaterally clear anterior, diminished left base  GI: soft, bsx4 active  Extremities: warm/dry, 1+ UE edema  Skin: no rashes, multiple scattered abrasions on legs, blistering on arms  Resolved Hospital Problem list   Cardiac arrest  Shock - suspect septic   Assessment & Plan:   Cardiac Arrest  No clear cause.  Doubt PE with relatively normal TTE. Initially had shock requiring vasopressors.   -ICU monitoring   Intermittent AFwRVR  Frequent PAC's  -tele monitoring  -follow electrolytes closely  -continue amiodarone PT -DNR in the event of arrest, continue ventilation  Sepsis secondary to Serratia PNA  Completed course of vanco, cefepime -follow off abx -trend WBC, fever curve  Acute Hypoxemic Respiratory Failure requiring Mechanical Ventilation Right Pleural Effusion s/p Thoracentesis  Failed extubation 2/8, aspirated 2/9 with swallow evaluation > worsening O2 needs after, re-intubated 2/10.   FOB 2/10 for tooth extraction.  -PRVC 8cc/kg  -wean PEEP / FiO2 for sats >90% -PSV wean as tolerated  -follow intermittent CXR  -SLP efforts post extubation  -follow pleural fluid cultures, cytology  -add on total protein to pleural fluid, unable to add on LDH once refrigerated  (discussed with lab)  Left Hydropneumothorax  -follow CT drainage  -CXR with improvement in hydropneumothorax but residual apical ptx, continue suction for today -consider water seal 2/12 if CXR stable  -CT care per protocol   Acute Metabolic Encephalopathy  Ischemic event with cardiac arrest superimposed on cerebral atrophy.  Neurological recovery is guarded given baseline cerebral atrophy.  -PAD protocol with precedex  -follow serial neuro exams -early PT efforts  -keppra 500 mg PT BID   AKI  Hypomagnesemia  -Continue IV lasix 100 ml BID -continue free water 200 ml Q6 -Trend BMP / urinary output -Replace electrolytes as indicated -Avoid nephrotoxic agents, ensure adequate renal perfusion -no indication for acute HD   Mild Transaminitis  Suspect multifactorial in setting of baseline ETOH use, shock from cardiac arrest  -follow LFT's, coag's  Anemia  -trend CBC  -monitor for bleeding   Hypoglycemia  -follow glucose trends  Daily Goals Checklist  Pain/Anxiety/Delirium protocol (if indicated): PAD protocol  VAP protocol (if indicated): bundle in place. Respiratory support goals: goal for extubation  Blood pressure target: Keep MAP>65 DVT prophylaxis: BID UFH Nutrition Status: TF, NPO Nutrition Problem: Moderate Malnutrition Etiology: chronic illness, social / environmental circumstances Signs/Symptoms: mild fat depletion, moderate muscle depletion Interventions: Tube feeding   GI prophylaxis: Protonix   Urinary catheter: Guide hemodynamic management Glucose control: episodes of hypoglycemia Mobility/therapy needs: BR Antibiotic de-escalation: n/a Code Status: DNR Family Communication: Sister Hassan Rowan) called for update 2/11.   Disposition: ICU     Labs   CBC: Recent Labs  Lab 08/21/19 0345 08/21/19 0345 08/22/19 0355 08/22/19 0355 08/23/19 0500 08/24/19 0326 08/24/19 0341 08/24/19 1224 08/25/19 0401  WBC 19.0*  --  21.4*  --  15.9* 17.3*  --   --   23.0*  NEUTROABS 16.7*  --  18.7*  --  13.7* 14.9*  --   --  22.0*  HGB 10.2*   < > 11.8*   < > 11.6* 11.8* 12.2* 11.9* 10.3*  HCT 29.3*   < > 34.2*   < > 33.4* 33.5* 36.0* 35.0* 29.7*  MCV 92.4  --  92.2  --  90.8 91.0  --   --  90.8  PLT 144*  --  179  --  172 162  --   --  154   < > = values in this interval not displayed.    Basic Metabolic Panel: Recent Labs  Lab 08/18/19 1630 08/18/19 2333 08/19/19 0500 08/19/19 0750 08/19/19 1600 08/20/19 0322 08/22/19 0355 08/22/19 0355 08/23/19 0500 08/23/19 0500 08/23/19 1236 08/24/19 0326 08/24/19 0341 08/24/19 1224 08/25/19 0401  NA  --    < > 140   < >  --    < > 145   < > 143   < > 142 144 144 145 145  K  --    < > 3.8   < >  --    < >  3.6   < > 3.3*   < > 3.2* 3.5 3.4* 3.5 4.1  CL  --   --  101  --   --    < > 97*  --  100  --  100 103  --   --  103  CO2  --   --  30  --   --    < > 31  --  29  --  29 30  --   --  31  GLUCOSE  --   --  188*  --   --    < > 80  --  78  --  131* 151*  --   --  148*  BUN  --   --  43*  --   --    < > 67*  --  82*  --  81* 83*  --   --  90*  CREATININE  --   --  2.49*  --   --    < > 3.19*  --  3.83*  --  3.89* 3.92*  --   --  4.11*  CALCIUM  --   --  6.4*  --   --    < > 8.0*  --  8.2*  --  8.1* 8.2*  --   --  8.2*  MG 2.3  --  1.9  --  1.9  --   --   --   --   --  1.9 1.8  --   --   --   PHOS 2.9  --  3.2  --  4.4  --   --   --   --   --   --   --   --   --   --    < > = values in this interval not displayed.   GFR: Estimated Creatinine Clearance: 18.2 mL/min (A) (by C-G formula based on SCr of 4.11 mg/dL (H)). Recent Labs  Lab 08/19/19 0500 08/20/19 0322 08/22/19 0355 08/23/19 0500 08/24/19 0326 08/25/19 0401  PROCALCITON 38.90  --   --   --   --   --   WBC 13.1*   < > 21.4* 15.9* 17.3* 23.0*   < > = values in this interval not displayed.    Liver Function Tests: Recent Labs  Lab 08/19/19 0500 08/19/19 0500 08/20/19 0322 08/22/19 0355 08/23/19 0500 08/24/19 0326  08/24/19 1345  AST 653*  --  499* 292* 273* 284*  --   ALT 248*  --  224* 142* 132* 134*  --   ALKPHOS 57  --  98 142* 164* 163*  --   BILITOT 0.7  --  1.5* 1.1 1.2 0.8  --   PROT 4.1*   < > 4.8* 4.7* 5.2* 5.0* 5.0*  ALBUMIN 1.1*  --  1.2* 1.2* 1.4* 1.3*  --    < > = values in this interval not displayed.   No results for input(s): LIPASE, AMYLASE in the last 168 hours. No results for input(s): AMMONIA in the last 168 hours.  ABG    Component Value Date/Time   PHART 7.432 08/24/2019 1224   PCO2ART 51.6 (H) 08/24/2019 1224   PO2ART 74.0 (L) 08/24/2019 1224   HCO3 34.4 (H) 08/24/2019 1224   TCO2 36 (H) 08/24/2019 1224   ACIDBASEDEF 5.0 (H) 08/17/2019 2152   O2SAT 95.0 08/24/2019 1224     Coagulation Profile: Recent Labs  Lab 08/24/19 0326  INR  1.2    Cardiac Enzymes: No results for input(s): CKTOTAL, CKMB, CKMBINDEX, TROPONINI in the last 168 hours.  HbA1C: Hgb A1c MFr Bld  Date/Time Value Ref Range Status  08/19/2019 01:16 AM 5.3 4.8 - 5.6 % Final    Comment:    (NOTE) Pre diabetes:          5.7%-6.4% Diabetes:              >6.4% Glycemic control for   <7.0% adults with diabetes     CBG: Recent Labs  Lab 08/24/19 1603 08/24/19 2032 08/25/19 0015 08/25/19 0347 08/25/19 0742  GLUCAP 142* 90 165* 134* 131*     CC Time: 33 minutes   Noe Gens, MSN, NP-C [facility] 08/25/2019, 8:31 AM   Please see [email or web address] for pager details.

## 2019-08-25 NOTE — Plan of Care (Signed)
Pt is alert to voice, follows commands most of the time, is able to move all extremities but is very weak. Pt's breathing is even and unlabored on vent. Oxygen saturation has maintained in the high 90s to 100 and probe site has been changed throughout the night to prevent another pressure ulcer. Worsening ulcer noted on upper top lip, a foam was placed but it fell due to secretions therefore, a small piece of gauze was placed to prevent further breakdown. Pt's blood pressure is being kept within set orders with neo gtt. Pt has not attempted to pull on lines, but does have mitts. Pt continues to have thick secretions suctions from mouth, nose, and ETT.  Pt only grimaces with movement and oral care. All skin tears covered with foam. Pt has slight weeping from both arms. Call bell is within and bed is in lowest position. Problem: Clinical Measurements: Goal: Diagnostic test results will improve Outcome: Progressing Goal: Respiratory complications will improve Outcome: Progressing Goal: Cardiovascular complication will be avoided Outcome: Progressing   Problem: Coping: Goal: Level of anxiety will decrease Outcome: Progressing   Problem: Elimination: Goal: Will not experience complications related to bowel motility Outcome: Progressing   Problem: Pain Managment: Goal: General experience of comfort will improve Outcome: Progressing

## 2019-08-25 NOTE — Progress Notes (Signed)
OT Cancellation Note  Patient Details Name: Philip Wells MRN: [id number] DOB: 11/22/1955   Cancelled Treatment:    Reason Eval/Treat Not Completed: Medical issues which prohibited therapy(Pt intubated 08/24/19. Will sign off and await reorder.)  Evern Bio 08/25/2019, 10:01 AM  Martie Round, OTR/L Acute Rehabilitation Services Pager: [phone] Office: [phone]

## 2019-08-25 NOTE — Progress Notes (Signed)
eLink Physician-Brief Progress Note Patient Name: Philip Wells DOB: [birth year] MRN: [id number]   Date of Service  08/25/2019  HPI/Events of Note  Multiple liquid stools - Request for Flexiseal.   eICU Interventions  Will order: 1. Place Flexiseal.      Intervention Category Major Interventions: Other:  Lenell Antu 08/25/2019, 8:23 PM

## 2019-08-26 ENCOUNTER — Inpatient Hospital Stay (HOSPITAL_COMMUNITY): Payer: Self-pay

## 2019-08-26 LAB — CBC WITH DIFFERENTIAL/PLATELET
Abs Immature Granulocytes: 0.29 10*3/uL — ABNORMAL HIGH (ref 0.00–0.07)
Basophils Absolute: 0.1 10*3/uL (ref 0.0–0.1)
Basophils Relative: 0 %
Eosinophils Absolute: 0 10*3/uL (ref 0.0–0.5)
Eosinophils Relative: 0 %
HCT: 29.9 % — ABNORMAL LOW (ref 39.0–52.0)
Hemoglobin: 10.1 g/dL — ABNORMAL LOW (ref 13.0–17.0)
Immature Granulocytes: 1 %
Lymphocytes Relative: 2 %
Lymphs Abs: 0.5 10*3/uL — ABNORMAL LOW (ref 0.7–4.0)
MCH: 31.7 pg (ref 26.0–34.0)
MCHC: 33.8 g/dL (ref 30.0–36.0)
MCV: 93.7 fL (ref 80.0–100.0)
Monocytes Absolute: 0.9 10*3/uL (ref 0.1–1.0)
Monocytes Relative: 3 %
Neutro Abs: 27 10*3/uL — ABNORMAL HIGH (ref 1.7–7.7)
Neutrophils Relative %: 94 %
Platelets: 197 10*3/uL (ref 150–400)
RBC: 3.19 MIL/uL — ABNORMAL LOW (ref 4.22–5.81)
RDW: 14.3 % (ref 11.5–15.5)
WBC: 28.7 10*3/uL — ABNORMAL HIGH (ref 4.0–10.5)
nRBC: 0.1 % (ref 0.0–0.2)

## 2019-08-26 LAB — BASIC METABOLIC PANEL
Anion gap: 13 (ref 5–15)
BUN: 107 mg/dL — ABNORMAL HIGH (ref 8–23)
CO2: 28 mmol/L (ref 22–32)
Calcium: 8.4 mg/dL — ABNORMAL LOW (ref 8.9–10.3)
Chloride: 103 mmol/L (ref 98–111)
Creatinine, Ser: 4.16 mg/dL — ABNORMAL HIGH (ref 0.61–1.24)
GFR calc Af Amer: 16 mL/min — ABNORMAL LOW (ref >60)
GFR calc non Af Amer: 14 mL/min — ABNORMAL LOW (ref >60)
Glucose, Bld: 152 mg/dL — ABNORMAL HIGH (ref 70–99)
Potassium: 3.9 mmol/L (ref 3.5–5.1)
Sodium: 144 mmol/L (ref 135–145)

## 2019-08-26 LAB — CULTURE, BAL-QUANTITATIVE W GRAM STAIN: Culture: 10000 — AB

## 2019-08-26 LAB — CULTURE, BLOOD (ROUTINE X 2)
Culture: NO GROWTH
Culture: NO GROWTH

## 2019-08-26 LAB — GLUCOSE, CAPILLARY
Glucose-Capillary: 150 mg/dL — ABNORMAL HIGH (ref 70–99)
Glucose-Capillary: 121 mg/dL — ABNORMAL HIGH (ref 70–99)
Glucose-Capillary: 138 mg/dL — ABNORMAL HIGH (ref 70–99)
Glucose-Capillary: 137 mg/dL — ABNORMAL HIGH (ref 70–99)
Glucose-Capillary: 145 mg/dL — ABNORMAL HIGH (ref 70–99)

## 2019-08-26 LAB — PROCALCITONIN: Procalcitonin: 6.04 ng/mL

## 2019-08-26 LAB — PROTIME-INR
INR: 1 (ref 0.8–1.2)
Prothrombin Time: 13.6 seconds (ref 11.4–15.2)

## 2019-08-26 LAB — PATHOLOGIST SMEAR REVIEW: Path Review: INCREASED

## 2019-08-26 MED ORDER — AMIODARONE HCL 200 MG PO TABS
200.0000 mg | ORAL_TABLET | Freq: Every day | ORAL | Status: DC
  Administered 2019-08-27 – 2019-08-28 (×2): 200 mg
  Filled 2019-08-26 (×2): qty 1

## 2019-08-26 MED ORDER — PIPERACILLIN-TAZOBACTAM IN DEX 2-0.25 GM/50ML IV SOLN
2.2500 g | Freq: Three times a day (TID) | INTRAVENOUS | Status: DC
  Administered 2019-08-26 – 2019-08-28 (×6): 2.25 g via INTRAVENOUS
  Filled 2019-08-26 (×8): qty 50

## 2019-08-26 MED ORDER — VANCOMYCIN VARIABLE DOSE PER UNSTABLE RENAL FUNCTION (PHARMACIST DOSING): Status: DC

## 2019-08-26 MED ORDER — POTASSIUM CHLORIDE 20 MEQ/15ML (10%) PO SOLN
40.0000 meq | Freq: Once | ORAL | Status: AC
  Administered 2019-08-26: 40 meq
  Filled 2019-08-26: qty 30

## 2019-08-26 MED ORDER — MAGNESIUM SULFATE IN D5W 1-5 GM/100ML-% IV SOLN
1.0000 g | Freq: Once | INTRAVENOUS | Status: AC
  Administered 2019-08-26: 1 g via INTRAVENOUS
  Filled 2019-08-26: qty 100

## 2019-08-26 MED ORDER — VANCOMYCIN HCL 1500 MG/300ML IV SOLN
1500.0000 mg | Freq: Once | INTRAVENOUS | Status: AC
  Administered 2019-08-26: 1500 mg via INTRAVENOUS
  Filled 2019-08-26: qty 300

## 2019-08-26 NOTE — Progress Notes (Signed)
Pharmacy Antibiotic Note  Philip Wells is a 64 y.o. male admitted on 09/07/2019 post cardiac arrest. New concern for aspiration pneumonia . Pharmacy has been consulted for vancomycin and zosyn dosing.  Patient currently afebrile, wbc is trending up to 28.7, pct trended down to 6 (was as high at 41 on 2/3). Tooth was aspirated during BAL on 2/10.   Patient currently in renal failure, no immediate needs for renal replacement. Will load vancomycin and redose based on random level in 24-48 hours.   Plan: Zosyn 2.25g IV q 8 hours Vancomycin 1580m IV x1 now then check level 24-48 hours and redose as appropriate  Height: _0  (180.3 cm) Weight: 160 lb 7.9 oz (72.8 kg) IBW/kg (Calculated) : 75.3  Temp (24hrs), Avg:97.6 F (36.4 C), Min:96.4 F (35.8 C), Max:99.2 F (37.3 C)  Recent Labs  Lab 08/22/19 0355 08/22/19 0355 08/23/19 0500 08/23/19 0500 08/23/19 1236 08/24/19 0326 08/25/19 0401 08/25/19 2211 08/26/19 0341  WBC 21.4*  --  15.9*  --   --  17.3* 23.0*  --  28.7*  CREATININE 3.19*   < > 3.83*   < > 3.89* 3.92* 4.11* 4.24* 4.16*   < > = values in this interval not displayed.    Estimated Creatinine Clearance: 18.5 mL/min (A) (by C-G formula based on SCr of 4.16 mg/dL (H)).    No Known Allergies  Antimicrobials this admission: 2/3 vanc (10019m2/2 ~ midnight; 125048m/4 ~ 1:30am; 1250m11m5)>>2/5, 2/12>> Cefepime 2/3>2/9 Zosyn 2/12>>  Microbiology results: 2/10 BAL - rare GNR, candida albicans 2/3 blood x2-ng 2/3 resp- serratia (R-cefazolin) 2/2 MRSA PCR- neg  Thank you for allowing pharmacy to be a part of this patient's care.  FranErin HearingrmD., BCPS Clinical Pharmacist 08/26/2019 11:46 AM

## 2019-08-26 NOTE — Progress Notes (Addendum)
NAME:  Philip Wells, MRN:  [id number], DOB:  Mar 23, 1956, LOS: 35 ADMISSION DATE:  09/08/2019, CONSULTATION DATE:  09/06/2019 REFERRING MD:  ED - [facility], CHIEF COMPLAINT:  Status post cardiac arrest.   Brief History   [name] with PEA cardiac arrest and subsequent distributive shock, presumably due to sepsis, AFwRVR.  STEMI ruled out. [name].     Past Medical History  ETOH use- wine  Significant Hospital Events   2/02 admitted with undifferentiated shock following cardiac arrest 2/04 continued pressor requirements.  Completed 36 degree TTM 2/06 more awake today.  Weaning pressor requirements. 2/07 Off pressors 2/08 extubated  2/10 Reintubated, FOB with tooth removal. Diltiazem added for AF rate control.  2/12 Weaning on 10/5   Consults:  Cardiology - Patient R/O for STEMI  Procedures:  ETT 2/2 >> 2/8, 2/10 >>  Foley 2/2 >>  L  TLC 2/5 >>  L CT 2/2 >>  L CT #2 2/3 >> L Radial Aline 2/2 >>   Significant Diagnostic Tests:   CT Head 2/2 >> severe atrophy but no hemorrhage   Renal US >> increased echogenicity consistent with medical renal disease  ECHO 2/3 >> normal EF, Grade II diastolic dysfunction   EEG 2/4 >> diffuse encephalopathy but no seizures  R Thoracentesis 2/10 >>    Micro Data:  COVID 2/2 >> negatve Flu A/B 2/2 >> negative  UC 2/2 >>  BCx2 2/3 >>  Tracheal Aspirate 2/2 >> moderate serratia marcescens, S-ceftriaxone, cefepime BAL 2/10 >> GS with rare Gram - rods R Pleural Fluid 2/10 >>   Antimicrobials:  Vanco 2/2 >> 2/5  Cefepime 2/2/ >> 2/9    Interim history/subjective:  RN reports no acute events overnight.  Precedex off and weaning 10/5 with RR of 30, sats of 98%  + 11 L since admission , net negative last 24 hours 274 cc's 2100 UO last 24 hours Afebrile, but WBC continues to increase, 28.7 on 2/12  Pleural fluid cultures pending  CXR 2/12>>Slightly improved aeration at the left lung base.  Persistent interstitial hazy densities in the right lung and left lung base. Stable small left apical pneumothorax.   Objective   Blood pressure 113/64, pulse 87, temperature (!) 97.3 F (36.3 C), temperature source Axillary, resp. rate (!) 23, height _0  (1.803 m), weight 72.8 kg, SpO2 96 %. CVP:  [7 mmHg-22 mmHg] 9 mmHg  Vent Mode: CPAP;PSV FiO2 (%):  [40 %] 40 % Set Rate:  [16 bmp] 16 bmp Vt Set:  [600 mL] 600 mL PEEP:  [5 cmH20] 5 cmH20 Pressure Support:  [10 cmH20] 10 cmH20 Plateau Pressure:  [24 cmH20-25 cmH20] 24 cmH20   Intake/Output Summary (Last 24 hours) at 08/26/2019 0927 Last data filed at 08/26/2019 0900 Gross per 24 hour  Intake 2579.92 ml  Output 2900 ml  Net -320.08 ml   Filed Weights   08/24/19 0320 08/25/19 0352 08/26/19 0500  Weight: 74.9 kg 70.9 kg 72.8 kg    Examination:  General: adult male, appears older than age, lying in bed in NAD on vent    HEENT: NCAT, MM pink/moist, ETT, OGT,  pupils =/reactive Neuro: Awakens to voice, nods appropriately, follows commands, MAE x 4  CV: s1s2 RRR, a fib per tele, No RMG PULM:  Non-labored on vent, on PSV 10/5, lungs bilaterally clear anterior, diminished left base , few rhonchi GI: soft, bsx4 active  Extremities: No obvious deformities, warm/dry, 1+ UE edema, brisk cap  refill  Skin: no rashes, multiple scattered abrasions on legs, blistering on arms  Resolved Hospital Problem list   Cardiac arrest  Shock - suspect septic   Assessment & Plan:   Cardiac Arrest  No clear cause.  Doubt PE with relatively normal TTE. Initially had shock requiring vasopressors.   -ICU monitoring   Intermittent AFwRVR  Frequent PAC's  -tele monitoring  -follow electrolytes closely  -continue amiodarone PT -DNR in the event of arrest, continue ventilation     Sepsis secondary to Serratia PNA  Completed course of vanco, cefepime WBC 28.7, no fever Sputum Cx 2/10 with GS revealing rare Gram - rods - Pleural Fluid cultures  pending - Will start Vanc and Zosyn per pharmacy - trend WBC, fever curve - Re culture as is clinically indicated - Will check PCT  Acute Hypoxemic Respiratory Failure requiring Mechanical Ventilation Right Pleural Effusion s/p Thoracentesis  Failed extubation 2/8, aspirated 2/9 with swallow evaluation > worsening O2 needs after, re-intubated 2/10.   FOB 2/10 for tooth extraction.  10/5 on 2/12 but RR 30 Plan -PRVC 8cc/kg  -wean PEEP / FiO2 for sats >90% -PSV wean as tolerated  -follow intermittent CXR  -SLP efforts post extubation  -follow pleural fluid cultures, cytology >> pending  Left Hydropneumothorax  -follow CT drainage >> 220 cc last 24 hours -CXR with improvement in hydropneumothorax but residual apical ptx, continue suction for today -consider water seal 2/13 if CXR stable  -CT care per protocol   Acute Metabolic Encephalopathy  Ischemic event with cardiac arrest superimposed on cerebral atrophy.  Neurological recovery is guarded given baseline cerebral atrophy.  -PAD protocol with precedex  -follow serial neuro exams -early PT efforts  -keppra 500 mg PT BID   AKI  Hypomagnesemia >> repleted and resolved  -Continue IV lasix 100 ml BID -continue free water 200 ml Q6 -Trend BMP / urinary output - Trend Mag -Replace electrolytes as indicated -Avoid nephrotoxic agents, ensure adequate renal perfusion -no indication for acute HD   Mild Transaminitis  Suspect multifactorial in setting of baseline ETOH use, shock from cardiac arrest  -follow LFT's, coag's  Anemia  -trend CBC  -monitor for bleeding   Hypoglycemia  -follow glucose trends  Nutrition Plan - Continue Tube Feeds  Daily Goals Checklist  Pain/Anxiety/Delirium protocol (if indicated): PAD protocol  VAP protocol (if indicated): bundle in place. Respiratory support goals: goal for extubation  Blood pressure target: Keep MAP>65 DVT prophylaxis: BID UFH Nutrition Status: TF, NPO Nutrition  Problem: Moderate Malnutrition Etiology: chronic illness, social / environmental circumstances Signs/Symptoms: mild fat depletion, moderate muscle depletion Interventions: Tube feeding   GI prophylaxis: Protonix   Urinary catheter: Guide hemodynamic management Glucose control: episodes of hypoglycemia Mobility/therapy needs: BR Antibiotic de-escalation: n/a Code Status: DNR Family Communication: Sister Hassan Rowan) called with  update 2/12   Disposition: ICU     Labs   CBC: Recent Labs  Lab 08/22/19 0355 08/22/19 0355 08/23/19 0500 08/23/19 0500 08/24/19 0326 08/24/19 0341 08/24/19 1224 08/25/19 0401 08/26/19 0341  WBC 21.4*  --  15.9*  --  17.3*  --   --  23.0* 28.7*  NEUTROABS 18.7*  --  13.7*  --  14.9*  --   --  22.0* 27.0*  HGB 11.8*   < > 11.6*   < > 11.8* 12.2* 11.9* 10.3* 10.1*  HCT 34.2*   < > 33.4*   < > 33.5* 36.0* 35.0* 29.7* 29.9*  MCV 92.2  --  90.8  --  91.0  --   --  90.8 93.7  PLT 179  --  172  --  162  --   --  154 197   < > = values in this interval not displayed.    Basic Metabolic Panel: Recent Labs  Lab 08/19/19 1600 08/20/19 0322 08/23/19 1236 08/23/19 1236 08/24/19 0326 08/24/19 0326 08/24/19 0341 08/24/19 1224 08/25/19 0401 08/25/19 2211 08/26/19 0341  NA  --    < > 142   < > 144   < > 144 145 145 144 144  K  --    < > 3.2*   < > 3.5   < > 3.4* 3.5 4.1 3.5 3.9  CL  --    < > 100  --  103  --   --   --  103 102 103  CO2  --    < > 29  --  30  --   --   --  _0 GLUCOSE  --    < > 131*  --  151*  --   --   --  148* 174* 152*  BUN  --    < > 81*  --  83*  --   --   --  90* 103* 107*  CREATININE  --    < > 3.89*  --  3.92*  --   --   --  4.11* 4.24* 4.16*  CALCIUM  --    < > 8.1*  --  8.2*  --   --   --  8.2* 8.4* 8.4*  MG 1.9  --  1.9  --  1.8  --   --   --   --  1.7  --   PHOS 4.4  --   --   --   --   --   --   --   --   --   --    < > = values in this interval not displayed.   GFR: Estimated Creatinine Clearance: 18.5 mL/min (A)  (by C-G formula based on SCr of 4.16 mg/dL (H)). Recent Labs  Lab 08/23/19 0500 08/24/19 0326 08/25/19 0401 08/26/19 0341  WBC 15.9* 17.3* 23.0* 28.7*    Liver Function Tests: Recent Labs  Lab 08/20/19 0322 08/22/19 0355 08/23/19 0500 08/24/19 0326 08/24/19 1345  AST 499* 292* 273* 284*  --   ALT 224* 142* 132* 134*  --   ALKPHOS 98 142* 164* 163*  --   BILITOT 1.5* 1.1 1.2 0.8  --   PROT 4.8* 4.7* 5.2* 5.0* 5.0*  ALBUMIN 1.2* 1.2* 1.4* 1.3*  --    No results for input(s): LIPASE, AMYLASE in the last 168 hours. No results for input(s): AMMONIA in the last 168 hours.  ABG    Component Value Date/Time   PHART 7.432 08/24/2019 1224   PCO2ART 51.6 (H) 08/24/2019 1224   PO2ART 74.0 (L) 08/24/2019 1224   HCO3 34.4 (H) 08/24/2019 1224   TCO2 36 (H) 08/24/2019 1224   ACIDBASEDEF 5.0 (H) 08/17/2019 2152   O2SAT 95.0 08/24/2019 1224     Coagulation Profile: Recent Labs  Lab 08/24/19 0326  INR 1.2    Cardiac Enzymes: No results for input(s): CKTOTAL, CKMB, CKMBINDEX, TROPONINI in the last 168 hours.  HbA1C: Hgb A1c MFr Bld  Date/Time Value Ref Range Status  08/19/2019 01:16 AM 5.3 4.8 - 5.6 % Final    Comment:    (NOTE) Pre diabetes:  5.7%-6.4% Diabetes:              >6.4% Glycemic control for   <7.0% adults with diabetes     CBG: Recent Labs  Lab 08/25/19 1600 08/25/19 2116 08/25/19 2345 08/26/19 0343 08/26/19 0744  GLUCAP 135* 139* 138* 150* 121*     CC Time: 35 minutes   Magdalen Spatz, MSN, AGACNP-BC [place] Pager # [phone] After 4 pm please call [phone]  08/26/2019, 9:27 AM

## 2019-08-26 NOTE — Plan of Care (Signed)
Shift summary  Diarrhea x 2 at beginning of shift, flexiseal placed, 600 ml liquid stool out on shift. TF continue.   Episode of Aflutter/Fib RVR 140-160 during/after cleaning/flexiseal placement. BP stable, discussed with eLink, titrated precedex up for anxiety. Desatted during episode, RT placed pt back on full vent support briefly with improved sats, able to transition back to PSV after back in NSR.  Amio IV bolus order received but pt spontaneously converted to NSR w PACs and per eLink, amio held. K/Mg checked and replaced.   Sedation vacation this am. Alert, follows commands, mae, assists with turning, nods yes/no.   Remains edematous, aggressive diuresing, 2.1 L in 24 hr. Penile edema, foley in place, following foley bundle.   Problem: Clinical Measurements: Goal: Ability to maintain clinical measurements within normal limits will improve Outcome: Progressing Goal: Will remain free from infection Outcome: Progressing Goal: Diagnostic test results will improve Outcome: Progressing Goal: Respiratory complications will improve Outcome: Progressing   Problem: Activity: Goal: Risk for activity intolerance will decrease Outcome: Progressing   Problem: Nutrition: Goal: Adequate nutrition will be maintained Outcome: Progressing   Problem: Coping: Goal: Level of anxiety will decrease Outcome: Progressing   Problem: Elimination: Goal: Will not experience complications related to urinary retention Outcome: Progressing   Problem: Pain Managment: Goal: General experience of comfort will improve Outcome: Progressing   Problem: Safety: Goal: Ability to remain free from injury will improve Outcome: Progressing

## 2019-08-26 NOTE — Progress Notes (Signed)
eLink Physician-Brief Progress Note Patient Name: Philip Wells DOB: 27-Apr-1956 MRN: [id number]   Date of Service  08/26/2019  HPI/Events of Note  K+ = 3.5, Mg++ = 1.7 and Creatinine = 4.24. Patient is on a Lasix IV infusion.   eICU Interventions  Will replace K+ and Mg++.      Intervention Category Major Interventions: Electrolyte abnormality - evaluation and management  Joani Cosma Eugene 08/26/2019, 12:55 AM

## 2019-08-26 NOTE — Plan of Care (Signed)
  Problem: Education: Goal: Knowledge of General Education information will improve Description: Including pain rating scale, medication(s)/side effects and non-pharmacologic comfort measures Outcome: Progressing   Problem: Clinical Measurements: Goal: Ability to maintain clinical measurements within normal limits will improve Outcome: Progressing Goal: Cardiovascular complication will be avoided Outcome: Progressing   Problem: Coping: Goal: Level of anxiety will decrease Outcome: Progressing   

## 2019-08-27 ENCOUNTER — Inpatient Hospital Stay (HOSPITAL_COMMUNITY): Payer: Self-pay

## 2019-08-27 DIAGNOSIS — J939 Pneumothorax, unspecified: Secondary | ICD-10-CM

## 2019-08-27 DIAGNOSIS — J90 Pleural effusion, not elsewhere classified: Secondary | ICD-10-CM

## 2019-08-27 DIAGNOSIS — Z9889 Other specified postprocedural states: Secondary | ICD-10-CM

## 2019-08-27 DIAGNOSIS — Z4682 Encounter for fitting and adjustment of non-vascular catheter: Secondary | ICD-10-CM

## 2019-08-27 LAB — GLUCOSE, CAPILLARY
Glucose-Capillary: 127 mg/dL — ABNORMAL HIGH (ref 70–99)
Glucose-Capillary: 135 mg/dL — ABNORMAL HIGH (ref 70–99)
Glucose-Capillary: 141 mg/dL — ABNORMAL HIGH (ref 70–99)
Glucose-Capillary: 146 mg/dL — ABNORMAL HIGH (ref 70–99)
Glucose-Capillary: 150 mg/dL — ABNORMAL HIGH (ref 70–99)
Glucose-Capillary: 163 mg/dL — ABNORMAL HIGH (ref 70–99)

## 2019-08-27 LAB — CBC WITH DIFFERENTIAL/PLATELET
Abs Immature Granulocytes: 0.21 10*3/uL — ABNORMAL HIGH (ref 0.00–0.07)
Basophils Absolute: 0 10*3/uL (ref 0.0–0.1)
Basophils Relative: 0 %
Eosinophils Absolute: 0 10*3/uL (ref 0.0–0.5)
Eosinophils Relative: 0 %
HCT: 28.7 % — ABNORMAL LOW (ref 39.0–52.0)
Hemoglobin: 10 g/dL — ABNORMAL LOW (ref 13.0–17.0)
Immature Granulocytes: 1 %
Lymphocytes Relative: 2 %
Lymphs Abs: 0.3 10*3/uL — ABNORMAL LOW (ref 0.7–4.0)
MCH: 31.5 pg (ref 26.0–34.0)
MCHC: 34.8 g/dL (ref 30.0–36.0)
MCV: 90.5 fL (ref 80.0–100.0)
Monocytes Absolute: 0.8 10*3/uL (ref 0.1–1.0)
Monocytes Relative: 4 %
Neutro Abs: 21 10*3/uL — ABNORMAL HIGH (ref 1.7–7.7)
Neutrophils Relative %: 93 %
Platelets: 243 10*3/uL (ref 150–400)
RBC: 3.17 MIL/uL — ABNORMAL LOW (ref 4.22–5.81)
RDW: 13.8 % (ref 11.5–15.5)
WBC: 22.4 10*3/uL — ABNORMAL HIGH (ref 4.0–10.5)
nRBC: 0 % (ref 0.0–0.2)

## 2019-08-27 LAB — COMPREHENSIVE METABOLIC PANEL
ALT: 83 U/L — ABNORMAL HIGH (ref 0–44)
AST: 96 U/L — ABNORMAL HIGH (ref 15–41)
Albumin: 1.4 g/dL — ABNORMAL LOW (ref 3.5–5.0)
Alkaline Phosphatase: 126 U/L (ref 38–126)
Anion gap: 12 (ref 5–15)
BUN: 111 mg/dL — ABNORMAL HIGH (ref 8–23)
CO2: 32 mmol/L (ref 22–32)
Calcium: 8.4 mg/dL — ABNORMAL LOW (ref 8.9–10.3)
Chloride: 100 mmol/L (ref 98–111)
Creatinine, Ser: 4.05 mg/dL — ABNORMAL HIGH (ref 0.61–1.24)
GFR calc Af Amer: 17 mL/min — ABNORMAL LOW (ref >60)
GFR calc non Af Amer: 15 mL/min — ABNORMAL LOW (ref >60)
Glucose, Bld: 143 mg/dL — ABNORMAL HIGH (ref 70–99)
Potassium: 3.2 mmol/L — ABNORMAL LOW (ref 3.5–5.1)
Sodium: 144 mmol/L (ref 135–145)
Total Bilirubin: 0.7 mg/dL (ref 0.3–1.2)
Total Protein: 5 g/dL — ABNORMAL LOW (ref 6.5–8.1)

## 2019-08-27 LAB — BODY FLUID CULTURE: Culture: NO GROWTH

## 2019-08-27 LAB — MAGNESIUM: Magnesium: 1.6 mg/dL — ABNORMAL LOW (ref 1.7–2.4)

## 2019-08-27 MED ORDER — MAGNESIUM SULFATE IN D5W 1-5 GM/100ML-% IV SOLN
1.0000 g | Freq: Once | INTRAVENOUS | Status: AC
  Administered 2019-08-27: 1 g via INTRAVENOUS
  Filled 2019-08-27: qty 100

## 2019-08-27 MED ORDER — POTASSIUM CHLORIDE 10 MEQ/50ML IV SOLN
10.0000 meq | INTRAVENOUS | Status: AC
  Administered 2019-08-27 (×2): 10 meq via INTRAVENOUS
  Filled 2019-08-27 (×2): qty 50

## 2019-08-27 MED ORDER — POTASSIUM CHLORIDE 20 MEQ/15ML (10%) PO SOLN
40.0000 meq | Freq: Once | ORAL | Status: AC
  Administered 2019-08-27: 40 meq
  Filled 2019-08-27: qty 30

## 2019-08-27 NOTE — Progress Notes (Signed)
eLink Physician-Brief Progress Note Patient Name: Philip Wells DOB: May 13, 1956 MRN: [id number]   Date of Service  08/27/2019  HPI/Events of Note  K+ = 3.2, Mg++ = 1.6 and Creatinine = 4.05. Patient was on Lasix IV infusion until yesterday.  eICU Interventions  Will cautiously replace Mg++ and K+.      Intervention Category Major Interventions: Electrolyte abnormality - evaluation and management  Sommer,Steven Eugene 08/27/2019, 6:31 AM

## 2019-08-27 NOTE — Plan of Care (Signed)
  Problem: Education: Goal: Knowledge of General Education information will improve Description: Including pain rating scale, medication(s)/side effects and non-pharmacologic comfort measures Outcome: Progressing   Problem: Clinical Measurements: Goal: Respiratory complications will improve Outcome: Progressing Goal: Cardiovascular complication will be avoided Outcome: Progressing   Problem: Nutrition: Goal: Adequate nutrition will be maintained Outcome: Progressing   Problem: Elimination: Goal: Will not experience complications related to bowel motility Outcome: Progressing Goal: Will not experience complications related to urinary retention Outcome: Progressing Note: Foley removed 08/26/19. Pt voiding appropriately and adequately via external male catheter.   Problem: Pain Managment: Goal: General experience of comfort will improve Outcome: Progressing   Problem: Skin Integrity: Goal: Risk for impaired skin integrity will decrease Outcome: Progressing

## 2019-08-27 NOTE — Progress Notes (Signed)
NAME:  Philip Wells, MRN:  [id number], DOB:  [birth year], LOS: 46 ADMISSION DATE:  09/10/2019, CONSULTATION DATE:  09/05/2019 REFERRING MD:  [facility] - [facility], CHIEF COMPLAINT:  Status post cardiac arrest.   Brief History   [name] with PEA cardiac arrest and subsequent distributive shock, presumably due to sepsis, AFwRVR.  STEMI ruled out. [name].     Past Medical History  ETOH use- wine  Significant Hospital Events   2/02 admitted with undifferentiated shock following cardiac arrest 2/04 continued pressor requirements.  Completed 36 degree TTM 2/06 more awake today.  Weaning pressor requirements. 2/07 Off pressors 2/08 extubated  2/10 Reintubated, FOB with tooth removal. Diltiazem added for AF rate control.  2/12 Weaning on 10/5 2/13 Apneic during wean, back on full support. Minimal chest tube output overnight    Consults:  Cardiology - Patient R/O for STEMI  Procedures:  ETT 2/2 >> 2/8, 2/10 >>  Foley 2/2 >>  L [place] TLC 2/5 >>  L CT 2/2 >>  L CT #2 2/3 >> L Radial Aline 2/2 >>   Significant Diagnostic Tests:   CT Head 2/2 >> severe atrophy but no hemorrhage   Renal US >> increased echogenicity consistent with medical renal disease  ECHO 2/3 >> normal EF, Grade II diastolic dysfunction   EEG 2/4 >> diffuse encephalopathy but no seizures  R Thoracentesis 2/10 >>    Micro Data:  COVID 2/2 >> negatve Flu A/B 2/2 >> negative  UC 2/2 >>  BCx2 2/3 >>  Tracheal Aspirate 2/2 >> moderate serratia marcescens, S-ceftriaxone, cefepime BAL 2/10 >> GS with rare Gram - rods, 10,000 Candida Albicans  R Pleural Fluid 2/10 >>   Antimicrobials:  Vanco 2/2 >> 2/5  Cefepime 2/2/ >> 2/9  Vanc 2/12> Zosyn 2/12> Interim history/subjective:  NAEO Low output from L chest tubes overnight on suction  Replacing Mag and K this morning   Slight interval decrease in size of L apical PTX on CXR this morning   Objective   Blood pressure 108/60, pulse  69, temperature 98.1 F (36.7 C), temperature source Axillary, resp. rate (!) 27, height _0  (1.803 m), weight 70.7 kg, SpO2 96 %. CVP:  [5 mmHg-7 mmHg] 6 mmHg  Vent Mode: PRVC FiO2 (%):  [40 %] 40 % Set Rate:  [16 bmp] 16 bmp Vt Set:  [600 mL] 600 mL PEEP:  [5 cmH20] 5 cmH20 Plateau Pressure:  [21 cmH20-26 cmH20] 21 cmH20   Intake/Output Summary (Last 24 hours) at 08/27/2019 1018 Last data filed at 08/27/2019 0900 Gross per 24 hour  Intake 2538.75 ml  Output 3068 ml  Net -529.25 ml   Filed Weights   08/25/19 0352 08/26/19 0500 08/27/19 0400  Weight: 70.9 kg 72.8 kg 70.7 kg    Examination:  General: Frail Adult M, critically ill appearing, lightly sedated and in bed NAD    HEENT: NCAT. Pink tacky mm. ETT secure. Trachea midline. Anicteric sclera  Neuro: Awake, alert. PERRLA 55m. Following commands  CV: RRR s1s2 no rgm cap refill < 3 seconds  PULM:  Scattered rhonchi most notable LUL. Symmetrical chest expansion. No accessory muscle use  GI: soft, flat ndnt + bowel sounds  Extremities: BUE mittens. No obvious joint deformity. No clubbing or cyanosis  Skin: c/d/w. Scattered abrasions   Resolved Hospital Problem list   Cardiac arrest  Shock - suspect septic  Hypoglycemia   Assessment & Plan:   Cardiac Arrest  No clear  cause.  Doubt PE with relatively normal TTE. Initially had shock requiring vasopressors.   -ICU monitoring   Intermittent AFwRVR  Frequent PAC's  -tele monitoring  -follow electrolytes closely  -continue amiodarone PT -DNR in the event of arrest, continue ventilation     Sepsis secondary to Serratia PNA  Completed course of vanco, cefepime WBC 28.7, no fever Sputum Cx 2/10 with GS revealing rare Gram - rods, candida (likely colonization)  PCT 6.04 Thus far, pleural cx unrevealing  P -continue vanc, zosyn per phrmacy.  -continue to follow pleural cultures  -Follow BAL   Acute Hypoxemic Respiratory Failure requiring re-intubation Right Pleural  Effusion s/p thoracentesis  Failed extubation 2/8, aspirated 2/9 with swallow evaluation > worsening O2 needs after, re-intubated 2/10.   FOB 2/10 for tooth extraction.  10/5 on 2/12 but RR 30 Plan -WUA/SBT -AM CXR -SLP efforts post extubation  -follow pleural fluid cultures, cytology >> pending  Left Hydropneumothorax  -CXR with improvement in hydropneumothorax but residual apical ptx -CXR 2/13 with interval decrease in size of L apical PTX  P -CT care per protocol  -AM CXR -consider water seal 7/51   Acute metabolic encephalopathy  Ischemic event with cardiac arrest superimposed on cerebral atrophy.  Neurological recovery is guarded given baseline cerebral atrophy. Possible ICU delirium   P -Delirium precautions  -PAD protocol with precedex  -follow serial neuro exams -early PT efforts  -keppra 500 mg PT BID   AKI  -2/13 Cr  4.05, BUN 111 Hypomagnesemia >> repleted and resolved  Hypokalemia P -Continue lasix -replacing Mag and K 2/13  -Trend BMP, mag -Avoid nephrotoxic agents, ensure adequate renal perfusion -no indication for acute HD   Mild Transaminitis  Suspect multifactorial in setting of baseline ETOH use, shock from cardiac arrest  -follow LFT's, coag's  Anemia, stable  -trend CBC  -hgb goal > 7, no transfusion indications at present   Nutrition - EN   Daily Goals Checklist  Pain/Anxiety/Delirium protocol (if indicated): PAD protocol  VAP protocol (if indicated): bundle in place. Respiratory support goals: Goal is extubation. Vent weaning not tolerated 2/13 Blood pressure target: Keep MAP>65 DVT prophylaxis: BID UFH Nutrition Status: EN Nutrition Problem: Moderate Malnutrition Etiology: chronic illness, social / environmental circumstances Signs/Symptoms: mild fat depletion, moderate muscle depletion Interventions: Tube feeding   GI prophylaxis: Protonix   Urinary catheter: Guide hemodynamic management Glucose control: episodes of  hypoglycemia Mobility/therapy needs: BR Antibiotic de-escalation: n/a Code Status: DNR Family Communication: Pending 2/13. Sister, Hassan Rowan, was called with updated 2/12 and will be attempted for communication 2/13.  Disposition: ICU     Labs   CBC: Recent Labs  Lab 08/23/19 0500 08/23/19 0500 08/24/19 0326 08/24/19 0326 08/24/19 0341 08/24/19 1224 08/25/19 0401 08/26/19 0341 08/27/19 0455  WBC 15.9*  --  17.3*  --   --   --  23.0* 28.7* 22.4*  NEUTROABS 13.7*  --  14.9*  --   --   --  22.0* 27.0* 21.0*  HGB 11.6*   < > 11.8*   < > 12.2* 11.9* 10.3* 10.1* 10.0*  HCT 33.4*   < > 33.5*   < > 36.0* 35.0* 29.7* 29.9* 28.7*  MCV 90.8  --  91.0  --   --   --  90.8 93.7 90.5  PLT 172  --  162  --   --   --  154 197 243   < > = values in this interval not displayed.    Basic Metabolic Panel: Recent  Labs  Lab 08/23/19 1236 08/23/19 1236 08/24/19 0326 08/24/19 0341 08/24/19 1224 08/25/19 0401 08/25/19 2211 08/26/19 0341 08/27/19 0455  NA 142   < > 144   < > 145 145 144 144 144  K 3.2*   < > 3.5   < > 3.5 4.1 3.5 3.9 3.2*  CL 100   < > 103  --   --  103 102 103 100  CO2 29   < > 30  --   --  _0 32  GLUCOSE 131*   < > 151*  --   --  148* 174* 152* 143*  BUN 81*   < > 83*  --   --  90* 103* 107* 111*  CREATININE 3.89*   < > 3.92*  --   --  4.11* 4.24* 4.16* 4.05*  CALCIUM 8.1*   < > 8.2*  --   --  8.2* 8.4* 8.4* 8.4*  MG 1.9  --  1.8  --   --   --  1.7  --  1.6*   < > = values in this interval not displayed.   GFR: Estimated Creatinine Clearance: 18.4 mL/min (A) (by C-G formula based on SCr of 4.05 mg/dL (H)). Recent Labs  Lab 08/24/19 0326 08/25/19 0401 08/26/19 0341 08/26/19 1005 08/27/19 0455  PROCALCITON  --   --   --  6.04  --   WBC 17.3* 23.0* 28.7*  --  22.4*    Liver Function Tests: Recent Labs  Lab 08/22/19 0355 08/23/19 0500 08/24/19 0326 08/24/19 1345 08/27/19 0455  AST 292* 273* 284*  --  96*  ALT 142* 132* 134*  --  83*  ALKPHOS 142*  164* 163*  --  126  BILITOT 1.1 1.2 0.8  --  0.7  PROT 4.7* 5.2* 5.0* 5.0* 5.0*  ALBUMIN 1.2* 1.4* 1.3*  --  1.4*   No results for input(s): LIPASE, AMYLASE in the last 168 hours. No results for input(s): AMMONIA in the last 168 hours.  ABG    Component Value Date/Time   PHART 7.432 08/24/2019 1224   PCO2ART 51.6 (H) 08/24/2019 1224   PO2ART 74.0 (L) 08/24/2019 1224   HCO3 34.4 (H) 08/24/2019 1224   TCO2 36 (H) 08/24/2019 1224   ACIDBASEDEF 5.0 (H) 08/17/2019 2152   O2SAT 95.0 08/24/2019 1224     Coagulation Profile: Recent Labs  Lab 08/24/19 0326 08/26/19 1005  INR 1.2 1.0    Cardiac Enzymes: No results for input(s): CKTOTAL, CKMB, CKMBINDEX, TROPONINI in the last 168 hours.  HbA1C: Hgb A1c MFr Bld  Date/Time Value Ref Range Status  08/19/2019 01:16 AM 5.3 4.8 - 5.6 % Final    Comment:    (NOTE) Pre diabetes:          5.7%-6.4% Diabetes:              >6.4% Glycemic control for   <7.0% adults with diabetes     CBG: Recent Labs  Lab 08/26/19 1547 08/26/19 2114 08/27/19 0024 08/27/19 0500 08/27/19 0806  GLUCAP 137* 145* 146* 135* 163*     CRITICAL CARE Performed by: Cristal Generous   Total critical care time: 40 minutes  Critical care time was exclusive of separately billable procedures and treating other patients. Critical care was necessary to treat or prevent imminent or life-threatening deterioration.  Critical care was time spent personally by me on the following activities: development of treatment plan with patient and/or surrogate as well as  nursing, discussions with consultants, evaluation of patient's response to treatment, examination of patient, obtaining history from patient or surrogate, ordering and performing treatments and interventions, ordering and review of laboratory studies, ordering and review of radiographic studies, pulse oximetry and re-evaluation of patient's condition.  Eliseo Gum MSN, AGACNP-BC [place] [phone] If no answer, [phone] 08/27/2019, 10:18 AM

## 2019-08-28 ENCOUNTER — Inpatient Hospital Stay (HOSPITAL_COMMUNITY): Payer: Self-pay

## 2019-08-28 LAB — COMPREHENSIVE METABOLIC PANEL
ALT: 112 U/L — ABNORMAL HIGH (ref 0–44)
AST: 129 U/L — ABNORMAL HIGH (ref 15–41)
Albumin: 1.5 g/dL — ABNORMAL LOW (ref 3.5–5.0)
Alkaline Phosphatase: 135 U/L — ABNORMAL HIGH (ref 38–126)
Anion gap: 12 (ref 5–15)
BUN: 104 mg/dL — ABNORMAL HIGH (ref 8–23)
CO2: 32 mmol/L (ref 22–32)
Calcium: 8.7 mg/dL — ABNORMAL LOW (ref 8.9–10.3)
Chloride: 103 mmol/L (ref 98–111)
Creatinine, Ser: 3.53 mg/dL — ABNORMAL HIGH (ref 0.61–1.24)
GFR calc Af Amer: 20 mL/min — ABNORMAL LOW (ref >60)
GFR calc non Af Amer: 17 mL/min — ABNORMAL LOW (ref >60)
Glucose, Bld: 138 mg/dL — ABNORMAL HIGH (ref 70–99)
Potassium: 3 mmol/L — ABNORMAL LOW (ref 3.5–5.1)
Sodium: 147 mmol/L — ABNORMAL HIGH (ref 135–145)
Total Bilirubin: 0.9 mg/dL (ref 0.3–1.2)
Total Protein: 5.3 g/dL — ABNORMAL LOW (ref 6.5–8.1)

## 2019-08-28 LAB — CBC WITH DIFFERENTIAL/PLATELET
Abs Immature Granulocytes: 0.21 10*3/uL — ABNORMAL HIGH (ref 0.00–0.07)
Basophils Absolute: 0 10*3/uL (ref 0.0–0.1)
Basophils Relative: 0 %
Eosinophils Absolute: 0 10*3/uL (ref 0.0–0.5)
Eosinophils Relative: 0 %
HCT: 32 % — ABNORMAL LOW (ref 39.0–52.0)
Hemoglobin: 10.7 g/dL — ABNORMAL LOW (ref 13.0–17.0)
Immature Granulocytes: 1 %
Lymphocytes Relative: 1 %
Lymphs Abs: 0.3 10*3/uL — ABNORMAL LOW (ref 0.7–4.0)
MCH: 31.4 pg (ref 26.0–34.0)
MCHC: 33.4 g/dL (ref 30.0–36.0)
MCV: 93.8 fL (ref 80.0–100.0)
Monocytes Absolute: 1.2 10*3/uL — ABNORMAL HIGH (ref 0.1–1.0)
Monocytes Relative: 5 %
Neutro Abs: 22.8 10*3/uL — ABNORMAL HIGH (ref 1.7–7.7)
Neutrophils Relative %: 93 %
Platelets: 286 10*3/uL (ref 150–400)
RBC: 3.41 MIL/uL — ABNORMAL LOW (ref 4.22–5.81)
RDW: 14.4 % (ref 11.5–15.5)
WBC: 24.5 10*3/uL — ABNORMAL HIGH (ref 4.0–10.5)
nRBC: 0 % (ref 0.0–0.2)

## 2019-08-28 LAB — GLUCOSE, CAPILLARY
Glucose-Capillary: 122 mg/dL — ABNORMAL HIGH (ref 70–99)
Glucose-Capillary: 130 mg/dL — ABNORMAL HIGH (ref 70–99)
Glucose-Capillary: 134 mg/dL — ABNORMAL HIGH (ref 70–99)
Glucose-Capillary: 96 mg/dL (ref 70–99)
Glucose-Capillary: 97 mg/dL (ref 70–99)
Glucose-Capillary: 98 mg/dL (ref 70–99)

## 2019-08-28 LAB — VANCOMYCIN, TROUGH: Vancomycin Tr: 20 ug/mL (ref 15–20)

## 2019-08-28 MED ORDER — POTASSIUM CHLORIDE 20 MEQ/15ML (10%) PO SOLN
40.0000 meq | Freq: Two times a day (BID) | ORAL | Status: AC
  Administered 2019-08-28 (×2): 40 meq via ORAL
  Filled 2019-08-28 (×2): qty 30

## 2019-08-28 MED ORDER — DEXAMETHASONE SODIUM PHOSPHATE 4 MG/ML IJ SOLN
4.0000 mg | Freq: Two times a day (BID) | INTRAMUSCULAR | Status: DC
  Administered 2019-08-28 – 2019-08-31 (×6): 4 mg via INTRAVENOUS
  Filled 2019-08-28 (×7): qty 1

## 2019-08-28 MED ORDER — POTASSIUM CHLORIDE 20 MEQ/15ML (10%) PO SOLN: 40.0000 meq | Freq: Two times a day (BID) | ORAL | Status: DC

## 2019-08-28 MED ORDER — HEPARIN SODIUM (PORCINE) 5000 UNIT/ML IJ SOLN
5000.0000 [IU] | Freq: Three times a day (TID) | INTRAMUSCULAR | Status: DC
  Administered 2019-08-28 – 2019-09-01 (×10): 5000 [IU] via SUBCUTANEOUS
  Filled 2019-08-28 (×10): qty 1

## 2019-08-28 MED ORDER — RESOURCE THICKENUP CLEAR PO POWD
ORAL | Status: DC | PRN
  Filled 2019-08-28: qty 125

## 2019-08-28 MED ORDER — ORAL CARE MOUTH RINSE
15.0000 mL | Freq: Two times a day (BID) | OROMUCOSAL | Status: DC
  Administered 2019-08-29: 15 mL via OROMUCOSAL

## 2019-08-28 NOTE — Progress Notes (Signed)
Pharmacy Antibiotic Note  Philip Wells is a 64 y.o. male admitted on 08/25/2019 post cardiac arrest. New concern for aspiration pneumonia . Pharmacy has been consulted for vancomycin and zosyn dosing. Acute on chronic renal dysfunction Cr peak 4.2> improved 3.5  Vanc random drawn this am is 20 after last dose about 48hr ago  Patient currently afebrile, wbc is trending up to 28> 24 on steroids - could be contributing to elevated wbc, pct trended down to 6 (was as high at 41 on 2/3). Tooth was aspirated during BAL on 2/10.   no growth on cx data   Plan: Discussed with CCM this am decided to stop abx and watch  Height: _0  (180.3 cm) Weight: 154 lb 8.7 oz (70.1 kg) IBW/kg (Calculated) : 75.3  Temp (24hrs), Avg:97.8 F (36.6 C), Min:97.5 F (36.4 C), Max:98.1 F (36.7 C)  Recent Labs  Lab 08/24/19 0326 08/24/19 0326 08/25/19 0401 08/25/19 2211 08/26/19 0341 08/27/19 0455 08/28/19 0507  WBC 17.3*  --  23.0*  --  28.7* 22.4* 24.5*  CREATININE 3.92*   < > 4.11* 4.24* 4.16* 4.05* 3.53*  VANCOTROUGH  --   --   --   --   --   --  20   < > = values in this interval not displayed.    Estimated Creatinine Clearance: 21 mL/min (A) (by C-G formula based on SCr of 3.53 mg/dL (H)).    No Known Allergies  Antimicrobials this admission: 2/3 vanc (1082m 2/2 ~ midnight; 12562m2/4 ~ 1:30am; 125017m/5)>>2/5, 2/12>> 2/14 Cefepime 2/3>2/9 Zosyn 2/12>> 21/4  Microbiology results: 2/10 BAL - rare GNR, candida albicans 2/3 blood x2-ng 2/3 resp- serratia (R-cefazolin) 2/2 MRSA PCR- neg  LisBonnita Nasutiarm.D. CPP, BCPS Clinical Pharmacist [phone]/2021 2:36 PM

## 2019-08-28 NOTE — Progress Notes (Addendum)
NAME:  Philip Wells, MRN:  [id number], DOB:  25-May-1956, LOS: 12 ADMISSION DATE:  08/23/2019, CONSULTATION DATE:  08/27/2019 REFERRING MD:  ED - [facility], CHIEF COMPLAINT:  Status post cardiac arrest.   Brief History   [name] with PEA cardiac arrest and subsequent distributive shock, presumably due to sepsis, AFwRVR.  STEMI ruled out. [name].     Past Medical History  ETOH use- wine  Significant Hospital Events   2/02 admitted with undifferentiated shock following cardiac arrest 2/04 continued pressor requirements.  Completed 36 degree TTM 2/06 more awake today.  Weaning pressor requirements. 2/07 Off pressors 2/08 extubated  2/10 Reintubated, FOB with tooth removal. Diltiazem added for AF rate control.  2/12 Weaning on 10/5 2/13 Apneic during wean, back on full support. Minimal chest tube output overnight    Consults:  Cardiology - Patient R/O for STEMI  Procedures:  ETT 2/2 >> 2/8, 2/10 > 2/13 Foley 2/2 >>  L [place] TLC 2/5 >>  L CT 2/2 >>  L CT #2 2/3 >> L Radial Aline 2/2   Significant Diagnostic Tests:   CT Head 2/2 >> severe atrophy but no hemorrhage   Renal US >> increased echogenicity consistent with medical renal disease  ECHO 2/3 >> normal EF, Grade II diastolic dysfunction   EEG 2/4 >> diffuse encephalopathy but no seizures  R Thoracentesis 2/10 >>    Micro Data:  COVID 2/2 >> negatve Flu A/B 2/2 >> negative  BCx2 2/3 >> NG Tracheal Aspirate 2/2 >> moderate serratia marcescens, S-ceftriaxone, cefepime BAL 2/10 >> GS with rare Gram - rods, 10,000 Candida Albicans  R Pleural Fluid 2/10 > ngtd   Antimicrobials:  Vanco 2/2 >> 2/5  Cefepime 2/2/ >> 2/9  Vanc 2/12> 2/14 Zosyn 2/12> 2/14 Interim history/subjective:  Has tolerated extubation well overnight Interval increase in R pleural effusion L apical PTX is stable   Objective   Blood pressure (!) 145/50, pulse 89, temperature 97.7 F (36.5 C), temperature  source Oral, resp. rate (!) 21, height _0  (1.803 m), weight 70.1 kg, SpO2 92 %. CVP:  [4 mmHg-6 mmHg] 6 mmHg  Vent Mode: PRVC FiO2 (%):  [40 %] 40 % Set Rate:  [16 bmp] 16 bmp Vt Set:  [600 mL] 600 mL PEEP:  [5 cmH20] 5 cmH20 Plateau Pressure:  [26 cmH20] 26 cmH20   Intake/Output Summary (Last 24 hours) at 08/28/2019 0857 Last data filed at 08/28/2019 0600 Gross per 24 hour  Intake 2132.47 ml  Output 5035 ml  Net -2902.53 ml   Filed Weights   08/26/19 0500 08/27/19 0400 08/28/19 0500  Weight: 72.8 kg 70.7 kg 70.1 kg    Examination:  General: Chronically ill appearing older adult M, reclined in bed, NAD  HEENT: [place]. Dressing in place over nasolabial fold. Anicteric sclera. Trachea midline.  Neuro: AAOx3 following commands. PERRLA 58m  CV: irregular rhythm. s1s2 no rgm. Cap refill < 3 seconds  PULM:  Scattered rhonchi. Diminished bibasilar sounds, R more so than L. Symmetrical chest expansion. No accessory muscle use. Chest tube 1 with minimal output and no air leak. Chest tube 2 with air leak, low output  GI: soft flat ndnt + bowel sounds  Extremities: BLE 1+ pitting edema. No obvious joint deformity. No cyanosis or clubbing  Skin: c/d/w with scattered healing abrasions   Resolved Hospital Problem list   Cardiac arrest  Shock - suspect septic  Hypoglycemia   Assessment & Plan:  S/p Cardiac Arrest  No clear cause.  Doubt PE with relatively normal TTE. Initially had shock requiring vasopressors.   -continue ICU monitoring and supportive care     Sepsis secondary to Serratia PNA  -completed course cefepime, end date 2/9 Leukocytosis -worsening following cessation of cefepime, with SBC 28.7 on 2/11.  -of note, patient is also on decadron (78m q6hr started 2/10) BAL 2/10 with GS revealing rare Gram - rods, candida (likely colonization)  PCT 6.04, but in setting of impaired renal function this is not a great marker P -weaning steroids to [id number] on 2/14. Plan to continue  weaning -Discussed with pharmacy. No clear infectious source and CXR does not look overtly concerning for focal PNAs. I will discontinue abx 2/14. Close trending of fever curve WBC. Can re-initiate if needed   Acute Hypoxemic Respiratory Failure requiring re-intubation, improving  Failed extubation 2/8, aspirated 2/9 with swallow evaluation > worsening O2 needs after, re-intubated 2/10.   FOB 2/10 for tooth extraction.  -Extubated 2/13 -Atelectasis on CXR as well as L apical pneumothorax and R pleural effusion  Plan -IS -Supplemental O2 for SpO2 > 92%  -SLP efforts post extubation  -PT/OT, bed in chair   Right Pleural Effusion s/p thoracentesis  -CXR 2/14 with increasing effusion -prior pleural fluid cx with no growth to date  P -continue diuresis; has not received yet 2/14 -may be candidate for repeat thora. -AM CXR   Left Hydropneumothorax  -CXR with improvement in hydropneumothorax but residual apical ptx -CXR 2/14 stable size of apical ptx -2/14 CT 1: no air leak CT 2: air leak P -CT care per protocol  -Leave to suction. When no air leak, water seal -AM CXR   Acute metabolic encephalopathy, improving Ischemic event with cardiac arrest superimposed on cerebral atrophy.  Neurological recovery is guarded given baseline cerebral atrophy. Possible ICU delirium   P -Delirium precautions  -early PT efforts  -keppra 500 mg PT BID   AKI  Hypokalemia P -Continue lasix -Replacing K 2/14 -abx dosing per pharm  Atrial Fibrillation, rate controlled at present -continue amiodarone -Mag goal 2 K goal 4 -cardiac monitoring   Mild Transaminitis  Suspect multifactorial in setting of baseline ETOH use, shock from cardiac arrest  -follow LFT's, coag's PRN  Anemia, stable  -trend CBC. No transfusion indication at present  Nutrition - SLP evaluating 2/14   Daily Goals Checklist  Pain/Anxiety/Delirium protocol (if indicated): na VAP protocol (if indicated): na Respiratory  support goals: Extubated 2/13 Blood pressure target: Keep MAP>65 DVT prophylaxis: SQ heparin Nutrition Status: Diet pending SLP eval  Nutrition Problem: Moderate Malnutrition Etiology: chronic illness, social / environmental circumstances Signs/Symptoms: mild fat depletion, moderate muscle depletion Interventions: Tube feeding   GI prophylaxis: Protonix   Glucose control: monitor Mobility/therapy needs: PT, OT, Bed in chair  Antibiotic de-escalation: n/a Code Status: Partial : no CPR, no DF/CV, no ACLS medications. NPPV, Intubation ok-- I discussed this with patient 2/14 and he confirms this is consistent with his wishes.  Family Communication: BrHassan Rowansister) updated 2/14.  Disposition: ICU. Patient is progressing and approaching readiness to transfer out of ICU. With worsening pleural effusion and fluctuating oxygen requirements following extubation, will likely keep in ICU 2/14.    Labs   CBC: Recent Labs  Lab 08/24/19 0326 08/24/19 0341 08/24/19 1224 08/25/19 0401 08/26/19 0341 08/27/19 0455 08/28/19 0507  WBC 17.3*  --   --  23.0* 28.7* 22.4* 24.5*  NEUTROABS 14.9*  --   --  22.0*  27.0* 21.0* 22.8*  HGB 11.8*   < > 11.9* 10.3* 10.1* 10.0* 10.7*  HCT 33.5*   < > 35.0* 29.7* 29.9* 28.7* 32.0*  MCV 91.0  --   --  90.8 93.7 90.5 93.8  PLT 162  --   --  154 197 243 286   < > = values in this interval not displayed.    Basic Metabolic Panel: Recent Labs  Lab 08/23/19 1236 08/23/19 1236 08/24/19 0326 08/24/19 0341 08/25/19 0401 08/25/19 2211 08/26/19 0341 08/27/19 0455 08/28/19 0507  NA 142   < > 144   < > 145 144 144 144 147*  K 3.2*   < > 3.5   < > 4.1 3.5 3.9 3.2* 3.0*  CL 100   < > 103   < > 103 102 103 100 103  CO2 29   < > 30   < > _0 32 32  GLUCOSE 131*   < > 151*   < > 148* 174* 152* 143* 138*  BUN 81*   < > 83*   < > 90* 103* 107* 111* 104*  CREATININE 3.89*   < > 3.92*   < > 4.11* 4.24* 4.16* 4.05* 3.53*  CALCIUM 8.1*   < > 8.2*   < > 8.2* 8.4*  8.4* 8.4* 8.7*  MG 1.9  --  1.8  --   --  1.7  --  1.6*  --    < > = values in this interval not displayed.   GFR: Estimated Creatinine Clearance: 21 mL/min (A) (by C-G formula based on SCr of 3.53 mg/dL (H)). Recent Labs  Lab 08/25/19 0401 08/26/19 0341 08/26/19 1005 08/27/19 0455 08/28/19 0507  PROCALCITON  --   --  6.04  --   --   WBC 23.0* 28.7*  --  22.4* 24.5*    Liver Function Tests: Recent Labs  Lab 08/22/19 0355 08/22/19 0355 08/23/19 0500 08/24/19 0326 08/24/19 1345 08/27/19 0455 08/28/19 0507  AST 292*  --  273* 284*  --  96* 129*  ALT 142*  --  132* 134*  --  83* 112*  ALKPHOS 142*  --  164* 163*  --  126 135*  BILITOT 1.1  --  1.2 0.8  --  0.7 0.9  PROT 4.7*   < > 5.2* 5.0* 5.0* 5.0* 5.3*  ALBUMIN 1.2*  --  1.4* 1.3*  --  1.4* 1.5*   < > = values in this interval not displayed.   No results for input(s): LIPASE, AMYLASE in the last 168 hours. No results for input(s): AMMONIA in the last 168 hours.  ABG    Component Value Date/Time   PHART 7.432 08/24/2019 1224   PCO2ART 51.6 (H) 08/24/2019 1224   PO2ART 74.0 (L) 08/24/2019 1224   HCO3 34.4 (H) 08/24/2019 1224   TCO2 36 (H) 08/24/2019 1224   ACIDBASEDEF 5.0 (H) 08/17/2019 2152   O2SAT 95.0 08/24/2019 1224     Coagulation Profile: Recent Labs  Lab 08/24/19 0326 08/26/19 1005  INR 1.2 1.0    Cardiac Enzymes: No results for input(s): CKTOTAL, CKMB, CKMBINDEX, TROPONINI in the last 168 hours.  HbA1C: Hgb A1c MFr Bld  Date/Time Value Ref Range Status  08/19/2019 01:16 AM 5.3 4.8 - 5.6 % Final    Comment:    (NOTE) Pre diabetes:          5.7%-6.4% Diabetes:              >  6.4% Glycemic control for   <7.0% adults with diabetes     CBG: Recent Labs  Lab 08/27/19 1601 08/27/19 2004 08/28/19 0005 08/28/19 0520 08/28/19 0814  GLUCAP 150* 127* 122* 130* 134*     CRITICAL CARE Performed by: Cristal Generous   Total critical care time:38 minutes  Critical care time was exclusive  of separately billable procedures and treating other patients. Critical care was necessary to treat or prevent imminent or life-threatening deterioration.  Critical care was time spent personally by me on the following activities: development of treatment plan with patient and/or surrogate as well as nursing, discussions with consultants, evaluation of patient's response to treatment, examination of patient, obtaining history from patient or surrogate, ordering and performing treatments and interventions, ordering and review of laboratory studies, ordering and review of radiographic studies, pulse oximetry and re-evaluation of patient's condition.  Eliseo Gum MSN, AGACNP-BC [place] [phone] If no answer, [phone] 08/28/2019, 8:58 AM

## 2019-08-28 NOTE — Progress Notes (Signed)
Pt returned to unit via bed accompanied by [employer] and RN. reconnected to unit monitor. Tolerated well. VSS. No s/s of distress at this time.

## 2019-08-28 NOTE — Progress Notes (Signed)
Pt O2 sats was in the mid 80's was on 3L [place] and asleep and mouth breathing. Pt O2 sats at 90-92% at 6L Called Resp-Kim- she recommended placing pt on venti- mask at 50%/12L and let her know if any other assistance from her is needed Charge Durwin Nora, RN and I did this and I will monitor pt closely

## 2019-08-28 NOTE — Progress Notes (Signed)
Modified Barium Swallow Progress Note  Patient Details  Name: Philip Wells MRN: [id number] Date of Birth: Nov 11, 1955  Today's Date: 08/28/2019  Modified Barium Swallow completed.  Full report located under Chart Review in the Imaging Section.  Brief recommendations include the following:  Clinical Impression  Modified Barium Swallow evaluation completed after concern for dysphagia noted at the bedside. Patient presents with mild to moderate oropharyngeal dysphagia likely related to generalized weakness, multiple prolonged intubations, deconditioning and impacted slightly by cortrack. Barium administered in thin liqiud, nectar liquid, pudding, on cracker and barium tablet. Pt's oral phase was only mildly slow/weak with spillage over base of tongue to the valleculae before the swallow was initiated. Pharyngeal swallow deficits included  reduced epiglottic inversion,reduced laryngeal movement and closure resulting in mild penetration of thin liquids only. Pt was not sensate to aspirate as it passed below the cords. Pt did not cough effectively to remove aspirate. Pharyngeal secretions present in pyriform sinuses that were not successfully removed with PO trials. Recommend Dys 3, nectar thickened liquids, medication whole in puree. Pt is a good candidate for swallowing therapy to address pharyngeal strengthening and a repeat MBS before advancement to thin liquids due to SILENT ASPIRATION.    Swallow Evaluation Recommendations       SLP Diet Recommendations: Dysphagia 3 (Mech soft) solids;Nectar thick liquid   Liquid Administration via: Cup;No straw   Medication Administration: Whole meds with puree   Supervision: Staff to assist with self feeding   Compensations: Minimize environmental distractions;Slow rate;Small sips/bites   Postural Changes: Seated upright at 90 degrees   Oral Care Recommendations: Oral care QID        Luis Abed., MA CCC-SLP 08/28/2019,12:32 PM

## 2019-08-28 NOTE — Plan of Care (Signed)
Pt will perform pharyngeal strengthening exercises to improve swallow function.

## 2019-08-28 NOTE — Progress Notes (Signed)
Occupational Therapy Evaluation Patient Details Name: Philip Wells MRN: [id number] DOB: 04-13-1956 Today's Date: 08/28/2019    History of Present Illness Patient is a 64 y/o male who presents after unwitnessed cardiac arrest with 6 rounds of CPR and subsequent distributive shock, presumably due to sepsis. Intubated 2/2-2/8. CT insertion x2 on 2/2 and 2/3. CXR 2/6-right lower lobe chest infiltrate. Small residual left-sided PTX. 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 No known PMH. Pt undrwent bronch with extraction of tooth from airway on 2/10, left intubated after procedure. Pt extubated on 2/13.   Clinical Impression   PTA, pt was independent with ADL and mobility. Attempted to call pt's sister for home information/PLOF - no answer. Mobilized to recliner with +2 Mod A @ RW level with VSS on 2L after sitting EOB @ 8 min with S. Overall mod to Max A with ADL. Pt aware he is in the hospital and that it is Valentine's Day but is unaware of events of his hospitalization. Nsg states pt was told this am.  Recommend rehab at [facility]. Will follow acutely.     Follow Up Recommendations  CIR;Supervision/Assistance - 24 hour    Equipment Recommendations  3 in 1 bedside commode    Recommendations for Other Services Rehab consult     Precautions / Restrictions Precautions Precautions: Fall;Other (comment)(chest tubes; rectal tube)      Mobility Bed Mobility Overal bed mobility: Needs Assistance Bed Mobility: Supine to Sit     Supine to sit: Mod assist        Transfers Overall transfer level: Needs assistance Equipment used: Rolling walker (2 wheeled) Transfers: Sit to/from [facility] Sit to Stand: Mod assist;+2 physical assistance Stand pivot transfers: Mod assist;+2 physical assistance            Balance Overall balance assessment: Needs assistance   Sitting balance-Leahy Scale: Fair       Standing  balance-Leahy Scale: Poor                             ADL either performed or assessed with clinical judgement   ADL Overall ADL's : Needs assistance/impaired Eating/Feeding: Moderate assistance Eating/Feeding Details (indicate cue type and reason): nectar thick Grooming: Minimal assistance;Oral care;Wash/dry face;Sitting   Upper Body Bathing: Moderate assistance;Sitting   Lower Body Bathing: Maximal assistance;Sit to/from stand   Upper Body Dressing : Maximal assistance;Sitting   Lower Body Dressing: Maximal assistance;Sit to/from stand   Toilet Transfer: Moderate assistance;+2 for physical assistance Toilet Transfer Details (indicate cue type and reason): simulated         Functional mobility during ADLs: Moderate assistance;+2 for physical assistance;Rolling walker;Cueing for safety;Cueing for sequencing       Vision   Additional Comments: difficulty seeing; unsure if he wears glasses; has to get very close to writing to see it     Perception     Praxis      Pertinent Vitals/Pain Pain Assessment: No/denies pain     Hand Dominance Right   Extremity/Trunk Assessment Upper Extremity Assessment Upper Extremity Assessment: LUE deficits/detail;RUE deficits/detail RUE Deficits / Details: generalized weakness LUE Deficits / Details: claw hand; necrotic thumb; unable to fully extend digits; will further assess; pt is unsure if his hand was like that PTA  LUE Coordination: decreased fine motor   Lower Extremity Assessment Lower Extremity Assessment: Defer to PT evaluation   Cervical / Trunk Assessment Cervical / Trunk Assessment: Normal  Communication Communication Communication: Other (comment)(soft voice; difficult to understand at times)   Cognition Arousal/Alertness: Awake/alert Behavior During Therapy: Restless(at times) Overall Cognitive Status: Impaired/Different from baseline Area of Impairment: Orientation;Attention;Following  commands;Safety/judgement;Awareness;Problem solving                 Orientation Level: Disoriented to;Situation(knows he is in the hospital but unsure where) Current Attention Level: Sustained Memory: Decreased short-term memory Following Commands: Follows one step commands with increased time Safety/Judgement: Decreased awareness of safety;Decreased awareness of deficits Awareness: Emergent Problem Solving: Slow processing;Decreased initiation;Difficulty sequencing;Requires verbal cues General Comments: Improved since PT eval   General Comments       Exercises     Shoulder Instructions      Home Living Family/patient expects to be discharged to:: Unsure Living Arrangements: Spouse/significant other Available Help at Discharge: Friend(s)                             Additional Comments: Need to get home information      Prior Functioning/Environment Level of Independence: Independent        Comments: Pt not able to provide PLOF/history due to cognition. Per chart, lives with his gf. Assuming independent        OT Problem List: Decreased strength;Decreased range of motion;Decreased activity tolerance;Impaired balance (sitting and/or standing);Impaired vision/perception;Decreased coordination;Decreased cognition;Decreased safety awareness;Decreased knowledge of use of DME or AE;Cardiopulmonary status limiting activity;Pain;Impaired UE functional use      OT Treatment/Interventions: Self-care/ADL training;Therapeutic exercise;Neuromuscular education;DME and/or AE instruction;Therapeutic activities;Energy conservation;Cognitive remediation/compensation;Patient/family education;Balance training    OT Goals(Current goals can be found in the care plan section) Acute Rehab OT Goals Patient Stated Goal: To go home and make love to his significant other OT Goal Formulation: With patient Time For Goal Achievement: 09/11/19 Potential to Achieve Goals: Good  OT  Frequency: Min 3X/week   Barriers to D/C:            Co-evaluation PT/OT/SLP Co-Evaluation/Treatment: Yes Reason for Co-Treatment: Complexity of the patient's impairments (multi-system involvement);For patient/therapist safety;To address functional/ADL transfers   OT goals addressed during session: ADL's and self-care;Strengthening/ROM      AM-PAC OT "6 Clicks" Daily Activity     Outcome Measure Help from another person eating meals?: A Lot Help from another person taking care of personal grooming?: A Little Help from another person toileting, which includes using toliet, bedpan, or urinal?: Total Help from another person bathing (including washing, rinsing, drying)?: A Lot Help from another person to put on and taking off regular upper body clothing?: A Lot Help from another person to put on and taking off regular lower body clothing?: A Lot 6 Click Score: 12   End of Session Equipment Utilized During Treatment: Gait belt;Rolling walker;Oxygen(2L) Nurse Communication: Mobility status  Activity Tolerance: Patient tolerated treatment well Patient left: in chair;with call bell/phone within reach;with chair alarm set  OT Visit Diagnosis: Other abnormalities of gait and mobility (R26.89);Muscle weakness (generalized) (M62.81);Other symptoms and signs involving cognitive function                Time: 1358-1435 OT Time Calculation (min): 37 min Charges:  OT General Charges $OT Visit: 1 Visit OT Evaluation $OT Eval Moderate Complexity: 1 Mod  Florance Paolillo, OT/L   Acute OT Clinical Specialist Acute Rehabilitation Services Pager [phone] Office [phone]   [facility] 08/28/2019, 2:54 PM

## 2019-08-28 NOTE — Progress Notes (Signed)
Pt taken to 6E23 via bed accompanied by RN. Pt connected to portable monitor and portable oxygen with flow rate of 2 lpm. Tolerated well. Vital signs remains stable. No s/s of distress at this time. Abigail RN at bedside to receive pt.

## 2019-08-28 NOTE — Progress Notes (Signed)
Pt taken to fluorsoscopy department via bed accompanied by RN and transport. Transported with portable monitor and nasal cannula at with flow rate of 3 lpm. Tolerated well. No s/s of distress.

## 2019-08-28 NOTE — Evaluation (Signed)
Clinical/Bedside Swallow Evaluation Patient Details  Name: Philip Wells MRN: [id number] Date of Birth: Jul 20, 1955  Today's Date: 08/28/2019 Time: SLP Start Time (ACUTE ONLY): 0840 SLP Stop Time (ACUTE ONLY): 0915 SLP Time Calculation (min) (ACUTE ONLY): 35 min  Past Medical History: History reviewed. No pertinent past medical history. Past Surgical History: History reviewed. No pertinent surgical history. HPI:  Philip Wells, 63y/m, presented to ED with unexplained cardiac arrest. Intubated 2/2-2/8. Reintubated from 2/10-2/13 due to serratia PNA. No known PMH   Assessment / Plan / Recommendation Clinical Impression  Patient seen for clinical swallow evaluation at bedside. Pt extubated 08/27/19 after a 3 day intubation (prior intubation from 2/2-2/8). Pt had previos swallow evaluation on 08/23/19 where he was inconsistently coughing with ice chips and water. SLP recommended NPO status. Pt was reintubated the next day and found to have Serratia PNA. Today however pt tolerated ice chips, water by cup and straw, applesauce and cracker with good oral manipulation, awareness, mastication and A-P transport. No coughing, throat clearing, voice changes or respiratory changes. Pt also tolerated 3 oz of water without stopping or coughing.  Although clinically he appears to tolerate all consistencies well, pt was intubated twice and may have been related to dysphagia a MBS is recommended to confirm pt is not silently aspirating and to further assess his oral and pharyngeal swallowing function. One incidence of belching after the drinking water.  SLP Visit Diagnosis: Dysphagia, oropharyngeal phase (R13.12)    Aspiration Risk  Mild aspiration risk    Diet Recommendation NPO        Other  Recommendations Oral Care Recommendations: Oral care QID     Swallow Study   General Date of Onset: 08/30/2019 HPI: Philip Wells, 63y/m, presented to ED with unexplained cardiac arrest. Intubated 2/2-2/8.  Reintubated from 2/10-2/13 due to serratia PNA. No known PMH Type of Study: Bedside Swallow Evaluation Previous Swallow Assessment: 08/23/19 Diet Prior to this Study: NPO Temperature Spikes Noted: No Respiratory Status: Nasal cannula History of Recent Intubation: Yes Length of Intubations (days): 9 days Date extubated: 08/27/19 Behavior/Cognition: Alert;Cooperative;Pleasant mood Oral Cavity Assessment: Within Functional Limits Oral Care Completed by SLP: Yes Oral Cavity - Dentition: Adequate natural dentition;Missing dentition Vision: Functional for self-feeding Self-Feeding Abilities: Able to feed self Patient Positioning: Upright in bed Baseline Vocal Quality: Hoarse;Low vocal intensity Volitional Cough: Strong Volitional Swallow: Able to elicit    Oral/Motor/Sensory Function Overall Oral Motor/Sensory Function: Within functional limits   Ice Chips Ice chips: Within functional limits Presentation: Spoon   Thin Liquid Thin Liquid: Within functional limits Presentation: Cup;Straw    Nectar Thick Nectar Thick Liquid: Not tested   Honey Thick Honey Thick Liquid: Not tested   Puree Puree: Within functional limits Presentation: Spoon   Solid     Solid: Within functional limits      Luis Abed., MA CCC-SLP 08/28/2019,10:27 AM

## 2019-08-28 NOTE — Progress Notes (Signed)
Physical Therapy Treatment Patient Details Name: Philip Wells MRN: [id number] DOB: [birth year] Today's Date: 08/28/2019    History of Present Illness Patient is a 64 y/o male who presents after unwitnessed cardiac arrest with 6 rounds of CPR and subsequent distributive shock, presumably due to sepsis. Intubated 2/2-2/8. CT insertion x2 on 2/2 and 2/3. CXR 2/6-right lower lobe chest infiltrate. Small residual left-sided PTX. 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 No known PMH. Pt undrwent bronch with extraction of tooth from airway on 2/10, left intubated after procedure. Pt extubated on 2/13.    PT Comments    Pt tolerated treatment well, requires frequent verbal and tactile cues to maintain safety. Pt requiring significant physical assistance to perform bed mobility and transfers at this time, with one therapist managing lines at all times due to pt's impaired awareness. Pt will benefit from continued acute PT POC to improve activity tolerance and reduce falls risk.   Follow Up Recommendations  CIR;Supervision/Assistance - 24 hour     Equipment Recommendations  (defer to post-acute setting)    Recommendations for Other Services Rehab consult     Precautions / Restrictions Precautions Precautions: Fall Precaution Comments: watch O2 Restrictions Weight Bearing Restrictions: No    Mobility  Bed Mobility Overal bed mobility: Needs Assistance Bed Mobility: Supine to Sit     Supine to sit: Mod assist;HOB elevated        Transfers Overall transfer level: Needs assistance Equipment used: Rolling walker (2 wheeled) Transfers: Sit to/from [facility] Sit to Stand: Mod assist;+2 physical assistance Stand pivot transfers: Mod assist;+2 physical assistance          Ambulation/Gait Ambulation/Gait assistance: Mod assist;+2 physical assistance Gait Distance (Feet): 3 Feet Assistive device: Rolling walker (2  wheeled) Gait Pattern/deviations: Shuffle;Trunk flexed Gait velocity: reduced Gait velocity interpretation: <1.8 ft/sec, indicate of risk for recurrent falls General Gait Details: short shuffling steps with increased trunk flexion, requires PT/OT tactile cues to direct gait and manage RW   Stairs             Wheelchair Mobility    Modified Rankin (Stroke Patients Only)       Balance Overall balance assessment: Needs assistance Sitting-balance support: Bilateral upper extremity supported;Feet supported Sitting balance-Leahy Scale: Fair Sitting balance - Comments: minG with UE support of bed   Standing balance support: Bilateral upper extremity supported Standing balance-Leahy Scale: Poor Standing balance comment: modA x2 with BUE support of RW                            Cognition Arousal/Alertness: Awake/alert Behavior During Therapy: Restless Overall Cognitive Status: Impaired/Different from baseline Area of Impairment: Orientation;Attention;Memory;Following commands;Safety/judgement;Awareness;Problem solving                 Orientation Level: Disoriented to;Situation Current Attention Level: Sustained Memory: Decreased recall of precautions;Decreased short-term memory Following Commands: Follows one step commands with increased time Safety/Judgement: Decreased awareness of safety;Decreased awareness of deficits Awareness: Emergent Problem Solving: Slow processing;Decreased initiation;Difficulty sequencing;Requires verbal cues General Comments: Improved since PT eval      Exercises General Exercises - Lower Extremity Ankle Circles/Pumps: AROM;5 reps;Both Gluteal Sets: AROM;Both;5 reps Heel Slides: AROM;Both;5 reps Straight Leg Raises: AROM;Both;5 reps    General Comments General comments (skin integrity, edema, etc.): pt on 2L [place] saturating from 86-94%, unsure if desat is reliable as pulse ox on toe and pt is asymptomatic  Pertinent  Vitals/Pain Pain Assessment: No/denies pain    Home Living Family/patient expects to be discharged to:: Unsure Living Arrangements: Spouse/significant other Available Help at Discharge: Friend(s)           Additional Comments: Need to get home information    Prior Function Level of Independence: Independent      Comments: Pt not able to provide PLOF/history due to cognition. Per chart, lives with his gf. Assuming independent   PT Goals (current goals can now be found in the care plan section) Acute Rehab PT Goals Patient Stated Goal: To go home and make love to his significant other Time For Goal Achievement: 09/11/19 Potential to Achieve Goals: Good Progress towards PT goals: Progressing toward goals    Frequency    Min 3X/week      PT Plan Discharge plan needs to be updated    Co-evaluation PT/OT/SLP Co-Evaluation/Treatment: Yes Reason for Co-Treatment: Complexity of the patient's impairments (multi-system involvement);Necessary to address cognition/behavior during functional activity;For patient/therapist safety;To address functional/ADL transfers PT goals addressed during session: Mobility/safety with mobility;Balance;Proper use of DME;Strengthening/ROM OT goals addressed during session: ADL's and self-care;Strengthening/ROM      AM-PAC PT "6 Clicks" Mobility   Outcome Measure  Help needed turning from your back to your side while in a flat bed without using bedrails?: A Lot Help needed moving from lying on your back to sitting on the side of a flat bed without using bedrails?: A Lot Help needed moving to and from a bed to a chair (including a wheelchair)?: A Lot Help needed standing up from a chair using your arms (e.g., wheelchair or bedside chair)?: A Lot Help needed to walk in hospital room?: Total Help needed climbing 3-5 steps with a railing? : Total 6 Click Score: 10    End of Session Equipment Utilized During Treatment: Oxygen;Gait belt Activity  Tolerance: Patient tolerated treatment well Patient left: in chair;with call bell/phone within reach;with chair alarm set Nurse Communication: Mobility status PT Visit Diagnosis: Muscle weakness (generalized) (M62.81)     Time: 0263-7858 PT Time Calculation (min) (ACUTE ONLY): 37 min  Charges:  $Therapeutic Activity: 8-22 mins                     Arlyss Gandy, PT, DPT Acute Rehabilitation Pager: [phone]    Arlyss Gandy 08/28/2019, 5:02 PM

## 2019-08-29 ENCOUNTER — Inpatient Hospital Stay (HOSPITAL_COMMUNITY): Payer: Self-pay

## 2019-08-29 DIAGNOSIS — Z515 Encounter for palliative care: Secondary | ICD-10-CM

## 2019-08-29 DIAGNOSIS — E440 Moderate protein-calorie malnutrition: Secondary | ICD-10-CM

## 2019-08-29 DIAGNOSIS — Z7189 Other specified counseling: Secondary | ICD-10-CM

## 2019-08-29 HISTORY — PX: IR THORACENTESIS ASP PLEURAL SPACE W/IMG GUIDE: IMG5380

## 2019-08-29 LAB — COMPREHENSIVE METABOLIC PANEL
ALT: 96 U/L — ABNORMAL HIGH (ref 0–44)
ALT: 88 U/L — ABNORMAL HIGH (ref 0–44)
AST: 92 U/L — ABNORMAL HIGH (ref 15–41)
AST: 77 U/L — ABNORMAL HIGH (ref 15–41)
Albumin: 1.5 g/dL — ABNORMAL LOW (ref 3.5–5.0)
Albumin: 1.6 g/dL — ABNORMAL LOW (ref 3.5–5.0)
Alkaline Phosphatase: 112 U/L (ref 38–126)
Alkaline Phosphatase: 103 U/L (ref 38–126)
Anion gap: 13 (ref 5–15)
Anion gap: 13 (ref 5–15)
BUN: 90 mg/dL — ABNORMAL HIGH (ref 8–23)
BUN: 82 mg/dL — ABNORMAL HIGH (ref 8–23)
CO2: 32 mmol/L (ref 22–32)
CO2: 33 mmol/L — ABNORMAL HIGH (ref 22–32)
Calcium: 8.5 mg/dL — ABNORMAL LOW (ref 8.9–10.3)
Calcium: 8.5 mg/dL — ABNORMAL LOW (ref 8.9–10.3)
Chloride: 104 mmol/L (ref 98–111)
Chloride: 104 mmol/L (ref 98–111)
Creatinine, Ser: 3.01 mg/dL — ABNORMAL HIGH (ref 0.61–1.24)
Creatinine, Ser: 2.74 mg/dL — ABNORMAL HIGH (ref 0.61–1.24)
GFR calc Af Amer: 24 mL/min — ABNORMAL LOW (ref >60)
GFR calc Af Amer: 27 mL/min — ABNORMAL LOW (ref >60)
GFR calc non Af Amer: 21 mL/min — ABNORMAL LOW (ref >60)
GFR calc non Af Amer: 23 mL/min — ABNORMAL LOW (ref >60)
Glucose, Bld: 108 mg/dL — ABNORMAL HIGH (ref 70–99)
Glucose, Bld: 102 mg/dL — ABNORMAL HIGH (ref 70–99)
Potassium: 3.3 mmol/L — ABNORMAL LOW (ref 3.5–5.1)
Potassium: 3.2 mmol/L — ABNORMAL LOW (ref 3.5–5.1)
Sodium: 149 mmol/L — ABNORMAL HIGH (ref 135–145)
Sodium: 150 mmol/L — ABNORMAL HIGH (ref 135–145)
Total Bilirubin: 0.8 mg/dL (ref 0.3–1.2)
Total Bilirubin: 1 mg/dL (ref 0.3–1.2)
Total Protein: 5.1 g/dL — ABNORMAL LOW (ref 6.5–8.1)
Total Protein: 5.1 g/dL — ABNORMAL LOW (ref 6.5–8.1)

## 2019-08-29 LAB — GRAM STAIN

## 2019-08-29 LAB — CBC
HCT: 33.1 % — ABNORMAL LOW (ref 39.0–52.0)
Hemoglobin: 10.9 g/dL — ABNORMAL LOW (ref 13.0–17.0)
MCH: 31.3 pg (ref 26.0–34.0)
MCHC: 32.9 g/dL (ref 30.0–36.0)
MCV: 95.1 fL (ref 80.0–100.0)
Platelets: 314 10*3/uL (ref 150–400)
RBC: 3.48 MIL/uL — ABNORMAL LOW (ref 4.22–5.81)
RDW: 14.5 % (ref 11.5–15.5)
WBC: 20.6 10*3/uL — ABNORMAL HIGH (ref 4.0–10.5)
nRBC: 0 % (ref 0.0–0.2)

## 2019-08-29 LAB — PROTEIN, PLEURAL OR PERITONEAL FLUID: Total protein, fluid: <3 g/dL

## 2019-08-29 LAB — BODY FLUID CELL COUNT WITH DIFFERENTIAL
Eos, Fluid: 0 %
Lymphs, Fluid: 20 %
Monocyte-Macrophage-Serous Fluid: 19 % — ABNORMAL LOW (ref 50–90)
Neutrophil Count, Fluid: 61 % — ABNORMAL HIGH (ref 0–25)
Total Nucleated Cell Count, Fluid: 167 cu mm (ref 0–1000)

## 2019-08-29 LAB — GLUCOSE, PLEURAL OR PERITONEAL FLUID: Glucose, Fluid: 103 mg/dL

## 2019-08-29 LAB — GLUCOSE, CAPILLARY
Glucose-Capillary: 106 mg/dL — ABNORMAL HIGH (ref 70–99)
Glucose-Capillary: 86 mg/dL (ref 70–99)
Glucose-Capillary: 112 mg/dL — ABNORMAL HIGH (ref 70–99)
Glucose-Capillary: 97 mg/dL (ref 70–99)
Glucose-Capillary: 88 mg/dL (ref 70–99)

## 2019-08-29 LAB — LACTATE DEHYDROGENASE, PLEURAL OR PERITONEAL FLUID: LD, Fluid: 222 U/L — ABNORMAL HIGH (ref 3–23)

## 2019-08-29 LAB — MAGNESIUM: Magnesium: 1.4 mg/dL — ABNORMAL LOW (ref 1.7–2.4)

## 2019-08-29 LAB — LACTATE DEHYDROGENASE: LDH: 396 U/L — ABNORMAL HIGH (ref 98–192)

## 2019-08-29 MED ORDER — PANTOPRAZOLE SODIUM 40 MG PO TBEC
40.0000 mg | DELAYED_RELEASE_TABLET | Freq: Every day | ORAL | Status: DC
  Administered 2019-08-29 – 2019-08-30 (×2): 40 mg via ORAL
  Filled 2019-08-29 (×3): qty 1

## 2019-08-29 MED ORDER — POTASSIUM CHLORIDE 20 MEQ/15ML (10%) PO SOLN
40.0000 meq | Freq: Two times a day (BID) | ORAL | Status: AC
  Administered 2019-08-29: 40 meq via ORAL
  Filled 2019-08-29 (×2): qty 30

## 2019-08-29 MED ORDER — AMIODARONE HCL 200 MG PO TABS
200.0000 mg | ORAL_TABLET | Freq: Every day | ORAL | Status: DC
  Administered 2019-08-29 – 2019-08-30 (×2): 200 mg via ORAL
  Filled 2019-08-29 (×3): qty 1

## 2019-08-29 MED ORDER — LEVETIRACETAM 500 MG PO TABS
500.0000 mg | ORAL_TABLET | Freq: Two times a day (BID) | ORAL | Status: DC
  Administered 2019-08-29 – 2019-08-30 (×3): 500 mg via ORAL
  Filled 2019-08-29 (×5): qty 1

## 2019-08-29 MED ORDER — LIDOCAINE HCL 1 % IJ SOLN
INTRAMUSCULAR | Status: AC
  Filled 2019-08-29: qty 20

## 2019-08-29 MED ORDER — LIDOCAINE HCL (PF) 1 % IJ SOLN
INTRAMUSCULAR | Status: DC | PRN
  Administered 2019-08-29: 10 mL

## 2019-08-29 NOTE — Progress Notes (Signed)
Nutrition Follow-up  DOCUMENTATION CODES:   Non-severe (moderate) malnutrition in context of chronic illness  INTERVENTION:   Magic cup TID with meals, each supplement provides 290 kcal and 9 grams of protein  Vital Cuisine shakes TID, each provides 520 kcals, 22 grams of protein  NUTRITION DIAGNOSIS:   Moderate Malnutrition related to chronic illness, social / environmental circumstances as evidenced by mild fat depletion, moderate muscle depletion.  Ongoing.  GOAL:   Patient will meet greater than or equal to 90% of their needs  Progressing.  MONITOR:   PO intake, Supplement acceptance, Weight trends, Labs, Skin  REASON FOR ASSESSMENT:   Consult, Ventilator Enteral/tube feeding initiation and management  ASSESSMENT:   64 yo male admitted post cardiac arrest with nonshockable rhythm with estimated downtime of 30 minutes, acute respiratory failure requiring intubation, shock, ARF with metabolic acidosis and oliguria. PMH includes EtOH use (negative EtOH on admission)  2/02 Admitted, Intubated, TTM 2/04 TTM complete 2/08 Extubated 2/09 Post-pyloric Cortrak placed 2/10 Re-intubated, s/p thoracentesis-600 ml drained   2/13 extubated 2/14 Pt transferred out of ICU; MBS performed with SLP recommending DYS3/Nectar 2/14 Cortrak removed 2/15 Small increase in left PTX, chest tube placed back to suction  Discussed pt with RN. Pt did not eat breakfast.   When RD asked pt about appetite, he reported wanting ice cream. RD explained diet order and need for thickened liquids. Pt agreeable to [employer] and [employer] shakes.  PO intake: 25% x1 recorded meal  Medications reviewed and include: Decadron, SSI, IV lasix  Labs reviewed: Na 149 (H), K+ 3.3 (L), Mg 1.4 (L) CBGs 86-106  UOP: 2,814ml x24 hours - output from chest tubes x24 hours I/O:+4,197.47ml since admit   Diet Order:   Diet Order            DIET DYS 3 Room service appropriate? Yes; Fluid  consistency: Nectar Thick  Diet effective now              EDUCATION NEEDS:   Not appropriate for education at this time  Skin:  Skin Assessment: Skin Integrity Issues: Skin Integrity Issues:: Stage I, Stage II, Stage III Stage I: head Stage II: upper lip Stage III: left face  Last BM:  2/14  Height:   Ht Readings from Last 1 Encounters:  09/05/2019 5\' 11"  (1.803 m)    Weight:   Wt Readings from Last 1 Encounters:  08/29/19 63.1 kg    BMI:  Body mass index is 19.4 kg/m.  Estimated Nutritional Needs:   Kcal:  1800-2000 kcal  Protein:  90-105 grams  Fluid:  >/= 1.8 L/day   08/31/19, MS, RD, LDN RD pager number and weekend/on-call pager number located in Amion.

## 2019-08-29 NOTE — Progress Notes (Signed)
Pt is now on 3L O2 with sats at 97%

## 2019-08-29 NOTE — Progress Notes (Signed)
7588: Pt had 5 beats of V-tach  HR increased to 150's lasting 4 minutes in atrial flutter Pt asymptomatic.   0942 BP 128/68 HR 103 ST  O2 95% 3L    Sheikh, MD notified.

## 2019-08-29 NOTE — Progress Notes (Addendum)
PROGRESS NOTE    Ronzell Laban Riemann  ZYS:[id number] DOB: 12-10-1955 DOA: 08/26/2019 PCP: Patient, No Pcp Per   Brief Narrative:  The patient is a 64 year old male with a PEA cardiac arrest and subsequent distributive shock presumably due to sepsis and A. fib with RVR.  He had a STEMI ruled out and he was also found to have aspiration pneumonia.  On 08/24/2019 he was admitted with underpenetrated shock following his cardiac arrest and ended up completing his targeted temperature management on 08/18/2019 and continued pressor requirement.  Pressors were weaning and then off pressors on 08/20/2018.  On 2/8 he was extubated but then on 2/10 he was reintubated with FOB tooth removal.  He still had atrial fibrillation so diltiazem was added.  On 2/13 he was back on full Vent support.  He was extubated 08/27/19 he was transferred to the medical floor on 08/28/2019 and transferred to [facility] service on 08/28/2018  Assessment & Plan:   Active Problems:   Cardiac arrest ([place])   Hydropneumothorax   Pneumothorax   AKI (acute kidney injury) ([place])   Encounter for chest tube placement   History of placement of chest tube   Septic shock (HCC)   Respiratory failure (HCC)   Malnutrition of moderate degree   Pressure injury of skin  S/p Cardiac Arrest  -Unclear of his cause -Unlikely it was a PE PE with relatively normal TTE.  -Initially had shock requiring vasopressors.   -continued ICU monitoring and supportive care  but now has been moved to progressive care to 4 E.    Sepsis secondary to Serratia PNA  -completed course cefepime, end date 2/9 -sepsis physiology is improving  Leukocytosis in the setting of his aspiration and steroid demargination -worsening following cessation of cefepime, with SBC 28.7 on 2/11.  -of note, patient is also on decadron (32m q6hr started 2/10) BAL 2/10 with GS revealing rare Gram - rods, candida (likely colonization)  PCT 6.04, but in setting of impaired renal function this is  not a great marker P -weaning steroids to [id number] on 2/14. Plan to continue weaning -Patient's WBC is now down to 20,600 but currently he is on dexamethasone 4 mg every 12 -Discussed with pharmacy. No clear infectious source and CXR does not look overtly concerning for focal PNAs.Abx 2/14 discontinued. Close trending of fever curve WBC. Can re-initiate if needed  -Repeat chest x-ray in a.m.  Acute Hypoxemic Respiratory Failure requiring re-intubation, improving  -Failed extubation 2/8, aspirated 2/9 with swallow evaluation > worsening O2 needs after, re-intubated 2/10.   FOB 2/10 for tooth extraction.  -Extubated 2/13 at night -Atelectasis on CXR as well as L apical pneumothorax and R pleural effusion  -We will repeat his thoracentesis today and he had 200 mL out -Continue with IS -Supplemental O2 for SpO2 > 92%  -SpO2: 97 % O2 Flow Rate (L/min): 3 L/min FiO2 (%): 40 % -SLP efforts post extubation  -Currently on dexamethasone -PT/OT, bed in chair  -PT OT recommending CIR  Right Pleural Effusion s/p thoracentesis  -CXR 2/14 with increasing effusion -prior pleural fluid cx with no growth to date  -continue diuresis with 100 mg every 12 -We will repeat his thoracentesis today -AM CXR   Left Hydropneumothorax  -CXR with improvement in hydropneumothorax but residual apical ptx which worsened today -Chest x-ray this morning showed worsening of his pneumothorax -2/14 CT 1: no air leak CT 2: air leak -CT care per protocol  and pulmonary is managing -Leave to suction. When  no air leak, water seal -Repeat the chest x-ray in a.m.  Acute metabolic encephalopathy, improving -Ischemic event with cardiac arrest superimposed on cerebral atrophy.  Neurological recovery is guarded given baseline cerebral atrophy. -Possible ICU delirium   -Delirium precautions  -early PT efforts  -keppra 500 mg PT BID   AKI  Hypokalemia -Continue lasix 100 mg twice daily -Replacing potassium again  with 40 mg twice daily -Antibiotics have now stopped -Patient's BUN/creatinine is trending down and went from 104/3.53 and today is 82/2.74 -Avoid further nephrotoxic medications, contrast dyes, hypotension and renally adjust medications -Continue to monitor and trend renal function -Repeat CMP in the a.m.  Atrial Fibrillation, rate controlled at present -continue Amiodarone -Mag goal 2 K goal 4 -Continue with telemetry monitoring  Mild Transaminitis  -Suspect multifactorial in setting of baseline ETOH use, shock from cardiac arrest  -follow LFT's and they are trending downwards as AST is now 77 and ALT is 88 -Continue to monitor and trend hepatic function panel next-repeat CBC in a.m.  Normocytic anemia, stable  -trend CBC. No transfusion indication at present -Patient's hemoglobin/hematocrit stable at 10.9/33.1 today -Check anemia panel in a.m. -Continue to monitor for signs and symptoms of bleeding; no overt bleeding noted  Nutrition -SLP evaluated and placed the patient on dysphagia 3 diet with nectar thick liquids -Continue to monitor for advancement -May need D5W free water given his hypernatremia but will hold off given that he is currently being diuresed  Hypernatremia -Mild as patient's sodium went from 144 is now 150 -Encourage oral feeding and may need to add just free water to his diet -Repeat CMP  Moderate Malnutrition in the Context of Chronic Illness -Dietitian consulted for further evaluation and Recc's -C/w Magic cup TID with meals and Vital Cuisine shakes TID  DVT prophylaxis: Subcu heparin Code Status: Patient is a partial code and no CPR should be initiated now defibrillator and no ACLS medications; intubation and PPV is okay Family Communication: No family present at bedside Disposition Plan: Had a PE arrest and is currently still being medically worked up and has Bilateral Chest Tubes; CIR to be consulted; patient was on 6 E. and will be transferred  down to 4 E for specialized nursing care that deals with chest tubes.   Consultants:   PCCM Transfer/Pulmonary  Cardiology will rule the patient out for STEMI  Palliative care  Procedures:  ETT 2/2 >> 2/8, 2/10 > 2/13 Foley 2/2 >>  L [place] TLC 2/5 >>  L CT 2/2 >>  L CT #2 2/3 >> L Radial Aline 2/2   Significant Diagnostic Tests:   CT Head 2/2 >> severe atrophy but no hemorrhage   Renal US >> increased echogenicity consistent with medical renal disease  ECHO 2/3 >> normal EF, Grade II diastolic dysfunction   EEG 2/4 >> diffuse encephalopathy but no seizures  R Thoracentesis 2/10 >>  Repeat right thoracentesis on 08/29/2019    Micro Data:  COVID 2/2 >> negatve Flu A/B 2/2 >> negative  BCx2 2/3 >> NG Tracheal Aspirate 2/2 >> moderate serratia marcescens, S-ceftriaxone, cefepime BAL 2/10 >> GS with rare Gram - rods, 10,000 Candida Albicans  R Pleural Fluid 2/10 > ngtd     Antimicrobials:  Anti-infectives (From admission, onward)   Start     Dose/Rate Route Frequency Ordered Stop   08/26/19 1400  vancomycin (VANCOREADY) IVPB 1500 mg/300 mL     1,500 mg 150 mL/hr over 120 Minutes Intravenous  Once 08/26/19 1145 08/26/19 1623  08/26/19 1230  piperacillin-tazobactam (ZOSYN) IVPB 2.25 g  Status:  Discontinued     2.25 g 100 mL/hr over 30 Minutes Intravenous Every 8 hours 08/26/19 1145 08/28/19 1433   08/26/19 1145  vancomycin variable dose per unstable renal function (pharmacist dosing)  Status:  Discontinued      Does not apply See admin instructions 08/26/19 1145 08/28/19 1433   08/23/19 1000  ceFEPIme (MAXIPIME) 2 g in sodium chloride 0.9 % 100 mL IVPB     2 g 200 mL/hr over 30 Minutes Intravenous Every 24 hours 08/22/19 1437 08/23/19 1250   08/20/19 1000  ceFEPIme (MAXIPIME) 2 g in sodium chloride 0.9 % 100 mL IVPB  Status:  Discontinued     2 g 200 mL/hr over 30 Minutes Intravenous Every 12 hours 08/20/19 0914 08/22/19 1437   08/19/19 0930  vancomycin  (VANCOREADY) IVPB 1250 mg/250 mL     1,250 mg 166.7 mL/hr over 90 Minutes Intravenous  Once 08/19/19 0904 08/19/19 1154   08/18/19 0045  vancomycin (VANCOREADY) IVPB 1250 mg/250 mL     1,250 mg 166.7 mL/hr over 90 Minutes Intravenous  Once 08/18/19 0031 08/18/19 0258   08/26/2019 2115  ceFEPIme (MAXIPIME) 2 g in sodium chloride 0.9 % 100 mL IVPB  Status:  Discontinued     2 g 200 mL/hr over 30 Minutes Intravenous Every 24 hours 08/22/2019 2114 08/20/19 0914   08/19/2019 2115  vancomycin (VANCOCIN) IVPB 1000 mg/200 mL premix     1,000 mg 200 mL/hr over 60 Minutes Intravenous  Once 08/17/2019 2114 08/17/19 0031   09/06/2019 2114  vancomycin variable dose per unstable renal function (pharmacist dosing)  Status:  Discontinued      Does not apply See admin instructions 08/22/2019 2114 08/20/19 0827     Subjective: And examined at bedside and had just come back from his thoracentesis and was doing okay but thought I came to see him this morning which I did not.  He was mistakenly further critical care PA and was adamant that I saw him this morning.  He states that he is coughing and is very difficult for him to bring up sputum.  No nausea or vomiting.  No other concerns or points this time.  Objective: Vitals:   08/29/19 0753 08/29/19 0940 08/29/19 0950 08/29/19 1214  BP:  128/68  126/69  Pulse: 82 100 99 95  Resp: 20 (!) 23 (!) 22 (!) 24  Temp:    99.1 F (37.3 C)  TempSrc:    Oral  SpO2: 92% 93% 95% 97%  Weight:      Height:        Intake/Output Summary (Last 24 hours) at 08/29/2019 1935 Last data filed at 08/29/2019 1900 Gross per 24 hour  Intake 316.3 ml  Output 4300 ml  Net -3983.7 ml   Filed Weights   08/27/19 0400 08/28/19 0500 08/29/19 0326  Weight: 70.7 kg 70.1 kg 63.1 kg   Examination: Physical Exam:  Constitutional: Thin and frail African-American male currently in mild distress appears calm but does appear a little uncomfortable and just come back from his thoracentesis and has  2 chest tubes in place Eyes: Lids and conjunctivae normal, sclerae anicteric  ENMT: External Ears, Nose appear normal. Grossly normal hearing.   Neck: Appears normal, supple, no cervical masses, normal ROM, no appreciable thyromegaly no JVD Respiratory: Diminished to auscultation bilaterally with coarse breath sounds, mild rhonchi and crackles. Normal respiratory effort and patient is not tachypenic. No accessory muscle use.  Wearing supplemental oxygen via nasal cannula at 3 L; has 2 chest tubes in place Cardiovascular: Irregularly irregular but is not tachycardic.  S1 and S2 auscultated.  1+ extremity edema.  Abdomen: Soft, non-tender, distended slightly.  Bowel sounds positive.  GU: Deferred. Musculoskeletal: No clubbing / cyanosis of digits/nails. No joint deformity upper and lower extremities.  Skin: No rashes, lesions, ulcers on limited skin evaluation. No induration; Warm and dry.  Neurologic: CN 2-12 grossly intact with no focal deficits.  Romberg sign cerebellar reflexes not assessed.  Psychiatric: Normal judgment and insight. Slightly confused and has a mildly agitated mood and appropriate affect.   Data Reviewed: I have personally reviewed following labs and imaging studies  CBC: Recent Labs  Lab 08/24/19 0326 08/24/19 0341 08/25/19 0401 08/26/19 0341 08/27/19 0455 08/28/19 0507 08/29/19 0348  WBC 17.3*   < > 23.0* 28.7* 22.4* 24.5* 20.6*  NEUTROABS 14.9*  --  22.0* 27.0* 21.0* 22.8*  --   HGB 11.8*   < > 10.3* 10.1* 10.0* 10.7* 10.9*  HCT 33.5*   < > 29.7* 29.9* 28.7* 32.0* 33.1*  MCV 91.0   < > 90.8 93.7 90.5 93.8 95.1  PLT 162   < > 154 197 243 286 314   < > = values in this interval not displayed.   Basic Metabolic Panel: Recent Labs  Lab 08/23/19 1236 08/23/19 1236 08/24/19 0326 08/24/19 0341 08/25/19 2211 08/25/19 2211 08/26/19 0341 08/27/19 0455 08/28/19 0507 08/29/19 0348 08/29/19 1650  NA 142   < > 144   < > 144   < > 144 144 147* 149* 150*  K 3.2*    < > 3.5   < > 3.5   < > 3.9 3.2* 3.0* 3.3* 3.2*  CL 100   < > 103   < > 102   < > 103 100 103 104 104  CO2 29   < > 30   < > 30   < > 28 32 32 32 33*  GLUCOSE 131*   < > 151*   < > 174*   < > 152* 143* 138* 108* 102*  BUN 81*   < > 83*   < > 103*   < > 107* 111* 104* 90* 82*  CREATININE 3.89*   < > 3.92*   < > 4.24*   < > 4.16* 4.05* 3.53* 3.01* 2.74*  CALCIUM 8.1*   < > 8.2*   < > 8.4*   < > 8.4* 8.4* 8.7* 8.5* 8.5*  MG 1.9  --  1.8  --  1.7  --   --  1.6*  --  1.4*  --    < > = values in this interval not displayed.   GFR: Estimated Creatinine Clearance: 24.3 mL/min (A) (by C-G formula based on SCr of 2.74 mg/dL (H)). Liver Function Tests: Recent Labs  Lab 08/24/19 0326 08/24/19 0326 08/24/19 1345 08/27/19 0455 08/28/19 0507 08/29/19 0348 08/29/19 1650  AST 284*  --   --  96* 129* 92* 77*  ALT 134*  --   --  83* 112* 96* 88*  ALKPHOS 163*  --   --  126 135* 112 103  BILITOT 0.8  --   --  0.7 0.9 0.8 1.0  PROT 5.0*   < > 5.0* 5.0* 5.3* 5.1* 5.1*  ALBUMIN 1.3*  --   --  1.4* 1.5* 1.5* 1.6*   < > = values in this interval not displayed.   No results  for input(s): LIPASE, AMYLASE in the last 168 hours. No results for input(s): AMMONIA in the last 168 hours. Coagulation Profile: Recent Labs  Lab 08/24/19 0326 08/26/19 1005  INR 1.2 1.0   Cardiac Enzymes: No results for input(s): CKTOTAL, CKMB, CKMBINDEX, TROPONINI in the last 168 hours. BNP (last 3 results) No results for input(s): PROBNP in the last 8760 hours. HbA1C: No results for input(s): HGBA1C in the last 72 hours. CBG: Recent Labs  Lab 08/28/19 1955 08/29/19 0324 08/29/19 0731 08/29/19 1212 08/29/19 1634  GLUCAP 96 106* 86 112* 97   Lipid Profile: No results for input(s): CHOL, HDL, LDLCALC, TRIG, CHOLHDL, LDLDIRECT in the last 72 hours. Thyroid Function Tests: No results for input(s): TSH, T4TOTAL, FREET4, T3FREE, THYROIDAB in the last 72 hours. Anemia Panel: No results for input(s): VITAMINB12,  FOLATE, FERRITIN, TIBC, IRON, RETICCTPCT in the last 72 hours. Sepsis Labs: Recent Labs  Lab 08/26/19 1005  PROCALCITON 6.04    Recent Results (from the past 240 hour(s))  Body fluid culture (includes gram stain)     Status: None   Collection Time: 08/24/19  9:40 AM   Specimen: Pleural Fluid  Result Value Ref Range Status   Specimen Description PLEURAL RIGHT  Final   Special Requests NONE  Final   Gram Stain   Final    RARE WBC PRESENT, PREDOMINANTLY MONONUCLEAR NO ORGANISMS SEEN    Culture   Final    NO GROWTH 3 DAYS Performed at [facility], [place] [address] [place], [place] [address]    Report Status 08/27/2019 FINAL  Final  Culture, bal-quantitative     Status: Abnormal   Collection Time: 08/24/19 10:39 AM   Specimen: Bronchoalveolar Lavage; Respiratory  Result Value Ref Range Status   Specimen Description BRONCHIAL ALVEOLAR LAVAGE RIGHT LOWER LUNG  Final   Special Requests NONE  Final   Gram Stain   Final    RARE WBC PRESENT,BOTH PMN AND MONONUCLEAR RARE GRAM NEGATIVE RODS Performed at [facility], [place] [address], [place], [place] [address]    Culture 10,000 COLONIES/mL CANDIDA ALBICANS (A)  Final   Report Status 08/26/2019 FINAL  Final  Gram stain     Status: None   Collection Time: 08/29/19 11:47 AM   Specimen: Lung, Right; Pleural Fluid  Result Value Ref Range Status   Specimen Description PLEURAL RIGHT  Final   Special Requests NONE  Final   Gram Stain   Final    RARE WBC PRESENT,BOTH PMN AND MONONUCLEAR NO ORGANISMS SEEN Performed at [facility], [place] [address], [place], [place] [address]    Report Status 08/29/2019 FINAL  Final     RN Pressure Injury Documentation: Pressure Injury 08/23/19 Head Left Stage 1 -  Intact skin with non-blanchable redness of a localized area usually over a bony prominence. Small Pressure Injury from Forehead Probe (Active)  08/23/19 0800  Location: Head  Location Orientation: Left  Staging: Stage 1 -   Intact skin with non-blanchable redness of a localized area usually over a bony prominence.  Wound Description (Comments): Small Pressure Injury from Forehead Probe  Present on Admission: No     Pressure Injury 08/24/19 Lip Upper Stage 2 -  Partial thickness loss of dermis presenting as a shallow open injury with a red, pink wound bed without slough. pink with white/yellow center in middle (Active)  08/24/19 2000  Location: Lip  Location Orientation: Upper  Staging: Stage 2 -  Partial thickness loss of  dermis presenting as a shallow open injury with a red, pink wound bed without slough.  Wound Description (Comments): pink with white/yellow center in middle  Present on Admission: No     Pressure Injury 08/27/19 Face Left Stage 3 -  Full thickness tissue loss. Subcutaneous fat may be visible but bone, tendon or muscle are NOT exposed. (Active)  08/27/19 1600  Location: Face  Location Orientation: Left  Staging: Stage 3 -  Full thickness tissue loss. Subcutaneous fat may be visible but bone, tendon or muscle are NOT exposed.  Wound Description (Comments):   Present on Admission:      Estimated body mass index is 19.4 kg/m as calculated from the following:   Height as of this encounter: _0  (1.803 m).   Weight as of this encounter: 63.1 kg.  Malnutrition Type:  Nutrition Problem: Moderate Malnutrition Etiology: chronic illness, social / environmental circumstances   Malnutrition Characteristics:  Signs/Symptoms: mild fat depletion, moderate muscle depletion   Nutrition Interventions:  Interventions: Magic cup, Hormel Shake   Radiology Studies: DG Chest 1 View  Result Date: 08/29/2019 CLINICAL DATA:  Thoracentesis today EXAM: CHEST  1 VIEW COMPARISON:  Yesterday FINDINGS: Decrease in right pleural effusion. Two left-sided chest tubes with unchanged small to moderate pneumothorax. Unchanged interstitial opacities. Normal heart size. Left subclavian line with tip at the upper  SVC. IMPRESSION: 1. No right-sided pneumothorax after thoracentesis. 2. Unchanged left chest tube positioning and pneumothorax. Electronically Signed   By: Monte Fantasia M.D.   On: 08/29/2019 11:33   DG Chest Port 1 View  Result Date: 08/29/2019 CLINICAL DATA:  Chest tube positions, evaluate for pneumothorax EXAM: PORTABLE CHEST 1 VIEW COMPARISON:  08/29/2019 at 1122 hours FINDINGS: Two indwelling left chest tubes. Small left apical pneumothorax, unchanged. Small right pleural effusion and small left basilar pleural fluid component. Stable right upper lobe and bibasilar opacities. Mild cardiomegaly. Left subclavian venous catheter terminates in the mid SVC. IMPRESSION: Two indwelling left chest tubes. Small left apical pneumothorax, unchanged. Additional stable findings as above. Electronically Signed   By: Julian Hy M.D.   On: 08/29/2019 19:30   DG CHEST PORT 1 VIEW  Result Date: 08/29/2019 CLINICAL DATA:  Pneumothorax with chest tubes in place EXAM: PORTABLE CHEST 1 VIEW COMPARISON:  August 28, 2019 FINDINGS: Chest tubes are again noted on the left. There is been increase in pneumothorax along the left lateral and left basilar regions with stable apical pneumothorax on the left. No appreciable tension component. Central catheter tip is in the superior vena cava. Feeding tube no longer evident. There is a right pleural effusion which may be slightly larger. There is bibasilar atelectasis as well as atelectasis in the right mid lung. Heart is upper normal in size with pulmonary vascularity normal. No adenopathy. No bone lesions. IMPRESSION: Stable chest tubes on the left. There has been increase in the left pneumothorax along its lateral and basilar regions with stable small pneumothorax in the left apex. No tension component. Stable right pleural effusion. Areas of atelectatic change bilaterally, more on the right than the left, remains stable. Stable cardiac silhouette. These results will be  called to the ordering clinician or representative by the Radiologist Assistant, and communication documented in the PACS or zVision Dashboard. Electronically Signed   By: Lowella Grip III M.D.   On: 08/29/2019 08:06   DG CHEST PORT 1 VIEW  Result Date: 08/28/2019 CLINICAL DATA:  Pneumothorax. EXAM: PORTABLE CHEST 1 VIEW COMPARISON:  08/27/2019 FINDINGS: Endotracheal tube  has been removed. A feeding tube remains in place, coursing into the abdomen with tip not imaged. A left subclavian catheter terminates over the mid upper SVC, unchanged. Two left-sided chest tubes remain in place. The cardiomediastinal silhouette is unchanged. There are persistent small bilateral pleural effusions and bibasilar airspace opacities with increased right basilar density compared to the prior study which may reflect a combination of right hemidiaphragm elevation, increased pleural effusion, and increased atelectasis. A small left apical pneumothorax is unchanged. IMPRESSION: 1. Interval extubation with worsening right basilar lung aeration. 2. Unchanged small left apical pneumothorax. 3. Electronically Signed   By: Logan Bores M.D.   On: 08/28/2019 09:16   DG Swallowing Func-Speech Pathology  Result Date: 08/28/2019 Objective Swallowing Evaluation: Type of Study: Bedside Swallow Evaluation  Patient Details Name: Ezrah Panning MRN: [id number] Date of Birth: September 26, 1955 Today's Date: 08/28/2019 Time: SLP Start Time (ACUTE ONLY): 1137 -SLP Stop Time (ACUTE ONLY): 1202 SLP Time Calculation (min) (ACUTE ONLY): 25 min Past Medical History: No past medical history on file. Past Surgical History: No past surgical history on file. HPI: Mr Dravin Lance, 63y/m, presented to ED with unexplained cardiac arrest. Intubated 2/2-2/8. Reintubated from 2/10-2/13 due to serratia PNA. No known PMH  Subjective: alert and cooperative Assessment / Plan / Recommendation CHL IP CLINICAL IMPRESSIONS 08/28/2019 Clinical Impression Modified Barium  Swallow evaluation completed after concern for dysphagia noted at the bedside. Patient presents with mild to moderate oropharyngeal dysphagia likely related to generalized weakness, multiple prolonged intubations, deconditioning and impacted slightly by cortrack. Barium administered in thin liqiud, nectar liquid, pudding, on cracker and barium tablet. Pt's oral phase was only mildly slow/weak with spillage over base of tongue to the valleculae before the swallow was initiated. Pharyngeal swallow deficits included  reduced epiglottic inversion,reduced laryngeal movement and closure resulting in mild penetration of thin liquids only. Pt was not sensate to aspirate as it passed below the cords. Pt did not cough effectively to remove aspirate. Pharyngeal secretions present in pyriform sinuses that were not successfully removed with PO trials. Recommend Dys 3, nectar thickened liquids, medication whole in puree. Pt is a good candidate for swallowing therapy to address pharyngeal strengthening and a repeat MBS before advancement to thin liquids due to [place].  SLP Visit Diagnosis Dysphagia, oropharyngeal phase (R13.12) Attention and concentration deficit following -- Frontal lobe and executive function deficit following -- Impact on safety and function Moderate aspiration risk   CHL IP TREATMENT RECOMMENDATION 08/28/2019 Treatment Recommendations Therapy as outlined in treatment plan below   Prognosis 08/28/2019 Prognosis for Safe Diet Advancement Good Barriers to Reach Goals Severity of deficits Barriers/Prognosis Comment -- CHL IP DIET RECOMMENDATION 08/28/2019 SLP Diet Recommendations Dysphagia 3 (Mech soft) solids;Nectar thick liquid Liquid Administration via Cup;No straw Medication Administration Whole meds with puree Compensations Minimize environmental distractions;Slow rate;Small sips/bites Postural Changes Seated upright at 90 degrees   CHL IP OTHER RECOMMENDATIONS 08/28/2019 Recommended Consults -- Oral  Care Recommendations Oral care QID Other Recommendations --   CHL IP FOLLOW UP RECOMMENDATIONS 08/28/2019 Follow up Recommendations Skilled Nursing facility   [facility] IP FREQUENCY AND DURATION 08/28/2019 Speech Therapy Frequency (ACUTE ONLY) min 2x/week Treatment Duration 2 weeks      CHL IP ORAL PHASE 08/28/2019 Oral Phase Impaired Oral - Pudding Teaspoon -- Oral - Pudding Cup -- Oral - Honey Teaspoon -- Oral - Honey Cup -- Oral - Nectar Teaspoon -- Oral - Nectar Cup -- Oral - Nectar Straw -- Oral - Thin Teaspoon -- Oral -  Thin Cup -- Oral - Thin Straw -- Oral - Puree -- Oral - Mech Soft Piecemeal swallowing Oral - Regular -- Oral - Multi-Consistency -- Oral - Pill -- Oral Phase - Comment --  CHL IP PHARYNGEAL PHASE 08/28/2019 Pharyngeal Phase Impaired Pharyngeal- Pudding Teaspoon Reduced laryngeal elevation;Reduced epiglottic inversion;Reduced anterior laryngeal mobility Pharyngeal Material does not enter airway Pharyngeal- Pudding Cup NT Pharyngeal -- Pharyngeal- Honey Teaspoon -- Pharyngeal -- Pharyngeal- Honey Cup -- Pharyngeal -- Pharyngeal- Nectar Teaspoon NT Pharyngeal -- Pharyngeal- Nectar Cup Reduced anterior laryngeal mobility;Reduced laryngeal elevation;Reduced airway/laryngeal closure;Reduced epiglottic inversion Pharyngeal -- Pharyngeal- Nectar Straw Reduced laryngeal elevation;Reduced airway/laryngeal closure;Reduced anterior laryngeal mobility;Reduced epiglottic inversion Pharyngeal -- Pharyngeal- Thin Teaspoon -- Pharyngeal -- Pharyngeal- Thin Cup Reduced epiglottic inversion;Reduced anterior laryngeal mobility;Reduced laryngeal elevation;Reduced airway/laryngeal closure;Penetration/Aspiration during swallow Pharyngeal Material enters airway, remains ABOVE vocal cords and not ejected out Pharyngeal- Thin Straw Reduced epiglottic inversion;Reduced anterior laryngeal mobility;Reduced laryngeal elevation;Reduced airway/laryngeal closure;Penetration/Aspiration during swallow Pharyngeal Material enters airway,  remains ABOVE vocal cords and not ejected out Pharyngeal- Puree -- Pharyngeal -- Pharyngeal- Mechanical Soft Reduced anterior laryngeal mobility;Reduced laryngeal elevation;Reduced airway/laryngeal closure Pharyngeal -- Pharyngeal- Regular -- Pharyngeal -- Pharyngeal- Multi-consistency -- Pharyngeal -- Pharyngeal- Pill -- Pharyngeal -- Pharyngeal Comment --  CHL IP CERVICAL ESOPHAGEAL PHASE 08/28/2019 Cervical Esophageal Phase WFL Pudding Teaspoon -- Pudding Cup -- Honey Teaspoon -- Honey Cup -- Nectar Teaspoon -- Nectar Cup -- Nectar Straw -- Thin Teaspoon -- Thin Cup -- Thin Straw -- Puree -- Mechanical Soft -- Regular -- Multi-consistency -- Pill -- Cervical Esophageal Comment -- Wynelle Bourgeois. MA CCC-SLP 08/28/2019, 12:30 PM              IR THORACENTESIS ASP PLEURAL SPACE W/IMG GUIDE  Result Date: 08/29/2019 INDICATION: Patient with PEA arrest, left hydropneumothorax status post chest placement x2. Now with right sided pleural effusion. Request is made for diagnostic and therapeutic right thoracentesis. EXAM: ULTRASOUND GUIDED RIGHT DIAGNOSTIC AND THERAPEUTIC THORACENTESIS MEDICATIONS: 10 mL 1% lidocaine COMPLICATIONS: None immediate. PROCEDURE: An ultrasound guided thoracentesis was thoroughly discussed with the patient and questions answered. The benefits, risks, alternatives and complications were also discussed. The patient understands and wishes to proceed with the procedure. Written consent was obtained. Ultrasound was performed to localize and mark an adequate pocket of fluid in the right chest. The area was then prepped and draped in the normal sterile fashion. 1% Lidocaine was used for local anesthesia. Under ultrasound guidance a 6 Fr Safe-T-Centesis catheter was introduced. Thoracentesis was performed. The catheter was removed and a dressing applied. FINDINGS: A total of approximately 200 mL of yellow fluid was removed. Samples were sent to the laboratory as requested by the clinical team. IMPRESSION:  Successful ultrasound guided diagnostic and therapeutic right thoracentesis yielding 200 mL of pleural fluid. Read by: Brynda Greathouse PA-C Electronically Signed   By: Markus Daft M.D.   On: 08/29/2019 11:49   Scheduled Meds: . amiodarone  200 mg Oral Daily  . Chlorhexidine Gluconate Cloth  6 each Topical Daily  . dexamethasone (DECADRON) injection  4 mg Intravenous Q12H  . heparin  5,000 Units Subcutaneous Q8H  . insulin aspart  0-9 Units Subcutaneous Q4H  . levETIRAcetam  500 mg Oral BID  . mouth rinse  15 mL Mouth Rinse BID  . pantoprazole  40 mg Oral Daily  . sodium chloride flush  10-40 mL Intracatheter Q12H   Continuous Infusions: . sodium chloride 10 mL/hr at 08/29/19 0300  . amiodarone Stopped (08/25/19 2208)  . furosemide 100  mg (08/29/19 1224)    LOS: 13 days    Kerney Elbe, DO [facility] PAGER is on [place]  If 7PM-7AM, please contact night-coverage [email or web address]

## 2019-08-29 NOTE — Progress Notes (Signed)
PT Cancellation Note  Patient Details Name: Philip Wells MRN: [id number] DOB: 11-03-1955   Cancelled Treatment:    Reason Eval/Treat Not Completed: Medical issues which prohibited therapy per chart review, pt now with worsening PTX and scheduled for thoracentesis. Holding until thoracentesis/following CXR done afterward per our protocol. Will continue to follow acutely.   Madelaine Etienne, DPT, PN1   Supplemental Physical Therapist [facility]    Pager [phone] Acute Rehab Office [phone]

## 2019-08-29 NOTE — Progress Notes (Signed)
Am CXR results revealed worsening. Pt oxygen saturation dropping into low 80's. On 4L [place]. Marland Mcalpine, MD notified.

## 2019-08-29 NOTE — Progress Notes (Signed)
Order received for albumin lab from pleural fluid.  Contacted cytology to notify that pt had thoracentesis earlier this morning and fluid should be in lab.

## 2019-08-29 NOTE — Progress Notes (Signed)
Occupational Therapy Treatment Patient Details Name: Philip Wells MRN: [id number] DOB: June 17, 1956 Today's Date: 08/29/2019    History of present illness Patient is a 64 y/o male who presents after unwitnessed cardiac arrest with 6 rounds of CPR and subsequent distributive shock, presumably due to sepsis. Intubated 2/2-2/8. CT insertion x2 on 2/2 and 2/3. CXR 2/6-right lower lobe chest infiltrate. Small residual left-sided PTX. 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 No known PMH. Pt undrwent bronch with extraction of tooth from airway on 2/10, left intubated after procedure. Pt extubated on 2/13.   OT comments  Assessed for splinting needs on Lt hand.  He would benefit from a resting hand splint and a anti claw (MCP blocking splint); however, pt with large blister/wound over dorsum of Lt hand which impedes pt being able to wear a splint safely without risk of developing further pressure/wounds.  He was able to achieve full active IP extension when MCPs blocked.  Will monitor for timing of splinting once wounds fully heal.   Follow Up Recommendations  CIR;Supervision/Assistance - 24 hour    Equipment Recommendations  3 in 1 bedside commode    Recommendations for Other Services Rehab consult    Precautions / Restrictions Precautions Precautions: Fall Precaution Comments: watch O2; 2 chest tubes, rectal tube        Mobility Bed Mobility                  Transfers                      Balance                                           ADL either performed or assessed with clinical judgement   ADL                                               Vision       Perception     Praxis      Cognition Arousal/Alertness: Awake/alert Behavior During Therapy: WFL for tasks assessed/performed Overall Cognitive Status: Impaired/Different from baseline Area of Impairment:  Orientation;Attention;Memory;Following commands;Safety/judgement;Problem solving;Awareness                 Orientation Level: Disoriented to Current Attention Level: Sustained Memory: Decreased recall of precautions;Decreased short-term memory Following Commands: Follows one step commands consistently Safety/Judgement: Decreased awareness of safety;Decreased awareness of deficits   Problem Solving: Slow processing;Decreased initiation;Difficulty sequencing;Requires verbal cues General Comments: Pt oriented to self, year, month, and place,  but not day or date, and not to situation.  He has emerging intellectual awareness as he is unaware of diagnosis, nor that he has undergone placement of chest tubes.          Exercises Exercises: Other exercises Other Exercises Other Exercises: Performed active extension/flexion of Lt hand with MCPs blocked  allowing for full IP extension - performed x 15 reps    Shoulder Instructions       General Comments      Pertinent Vitals/ Pain       Pain Assessment: No/denies pain  Home Living  Prior Functioning/Environment              Frequency  Min 3X/week        Progress Toward Goals  OT Goals(current goals can now be found in the care plan section)  Progress towards OT goals: Progressing toward goals     Plan Discharge plan remains appropriate    Co-evaluation                 AM-PAC OT "6 Clicks" Daily Activity     Outcome Measure   Help from another person eating meals?: A Lot Help from another person taking care of personal grooming?: A Little Help from another person toileting, which includes using toliet, bedpan, or urinal?: Total Help from another person bathing (including washing, rinsing, drying)?: A Lot Help from another person to put on and taking off regular upper body clothing?: A Lot Help from another person to put on and taking off regular  lower body clothing?: A Lot 6 Click Score: 12    End of Session    OT Visit Diagnosis: Other abnormalities of gait and mobility (R26.89);Muscle weakness (generalized) (M62.81);Other symptoms and signs involving cognitive function   Activity Tolerance     Patient Left     Nurse Communication          Time: 8676-1950 OT Time Calculation (min): 12 min  Charges: OT General Charges $OT Visit: 1 Visit OT Treatments $Therapeutic Exercise: 8-22 mins  Eber Jones OTR/L Acute Rehabilitation Services Pager [phone] Office [phone]    Jeani Hawking M 08/29/2019, 3:14 PM

## 2019-08-29 NOTE — Progress Notes (Addendum)
NAME:  Philip Wells, MRN:  [id number], DOB:  06-17-1956, LOS: 62 ADMISSION DATE:  09/05/2019, CONSULTATION DATE:  08/23/2019 REFERRING MD:  ED - [facility], CHIEF COMPLAINT:  Status post cardiac arrest.   Brief History   [name] with PEA cardiac arrest and subsequent distributive shock, presumably due to sepsis, AFwRVR.  STEMI ruled out. [name].     Past Medical History  ETOH use- wine  Significant Hospital Events   2/02 admitted with undifferentiated shock following cardiac arrest 2/04 continued pressor requirements.  Completed 36 degree TTM 2/06 more awake today.  Weaning pressor requirements. 2/07 Off pressors 2/08 extubated  2/10 Reintubated, FOB with tooth removal. Diltiazem added for AF rate control. R thora 2/12 Weaning on 10/5 2/13 Apneic during wean, back on full support. Minimal chest tube output overnight  2/14 transfer out of ICU 2/15 small increase in left PTX, chest tube placed back to suction.  R thora by IR   Consults:  Cardiology - Patient R/O for STEMI  Procedures:  ETT 2/2 >> 2/8, 2/10 > 2/13 Foley 2/2 >>  L [place] TLC 2/5 >>  L CT 2/2 >>  L CT #2 2/3 >> L Radial Aline 2/2   Significant Diagnostic Tests:  CT Head 2/2 >> severe atrophy but no hemorrhage  Renal US >> increased echogenicity consistent with medical renal disease ECHO 2/3 >> normal EF, Grade II diastolic dysfunction  EEG 2/4 >> diffuse encephalopathy but no seizures  Micro Data:  COVID 2/2 >> negatve Flu A/B 2/2 >> negative  BCx2 2/3 >> NG Tracheal Aspirate 2/2 >> moderate serratia marcescens, S-ceftriaxone, cefepime BAL 2/10 >> GS with rare Gram - rods, 10,000 Candida Albicans  R Pleural Fluid 2/10 > ngtd   Antimicrobials:  Vanco 2/2 >> 2/5  Cefepime 2/2/ >> 2/9  Vanc 2/12> 2/14 Zosyn 2/12> 2/14  Interim history/subjective:  "I'm feeling better".  Niece at bedside confirms pt seems to be slightly better. Had temp drop in sats this AM to 80s.   Temporarily increased to 5L O2 (from 3).  TRH called PCCM to evaluate. On my arrival, O2 back down to 3L with sats 92 - 94%.  CXR from this AM with slight increase in left PTX. Chest tube #2 with small air leak on forced expiration/ cough.  This  chest tube to be placed back to suction this AM.  Objective   Blood pressure 128/68, pulse 100, temperature 98.2 F (36.8 C), temperature source Oral, resp. rate (!) 23, height _0  (1.803 m), weight 63.1 kg, SpO2 93 %.        Intake/Output Summary (Last 24 hours) at 08/29/2019 1003 Last data filed at 08/29/2019 0500 Gross per 24 hour  Intake 498.55 ml  Output 3810 ml  Net -3311.45 ml   Filed Weights   08/27/19 0400 08/28/19 0500 08/29/19 0326  Weight: 70.7 kg 70.1 kg 63.1 kg    Examination:  General: Chronically ill appearing older adult M, resting comfortably, NAD  HEENT: [place]/AT.  MM moist. Neuro: AAOx3 following commands. CV: IRIR, no M/R/G PULM:  Scattered rhonchi. Diminished bibasilar sounds.  Very small air leak with cough / forced expiration on chest tube 2. GI: soft flat ndnt + bowel sounds  Extremities: BLE 1+ pitting edema. No obvious joint deformity. No cyanosis or clubbing.  Left thumb necrotic appearing. Skin: c/d/w with scattered healing abrasions   Resolved Hospital Problem list   Cardiac arrest  Shock - suspect septic  Hypoglycemia   Assessment & Plan:   Acute Hypoxemic Respiratory Failure requiring re-intubation - Failed extubation 2/8, aspirated 2/9 with swallow evaluation > worsening O2 needs after requiring re-intubation 2/10.   Had FOB 2/10 for tooth extraction.  - Continue supplemental O2 for SpO2 > 92%. - Continue IS. - PT/OT, bed in chair.   Left Hydropneumothorax - slight increase in PTX 2/15. - Place chest tube #2 back to 20cm suction now then repeat CXR this PM and in AM 2/16 (#2 has small air leak this AM).  Right Pleural Effusion s/p thoracentesis 2/10 (NGTD). - Continue diuresis. - Repeat thora  already being performed by IR this AM.  S/p Cardiac Arrest - No clear cause.  Doubt PE with relatively normal TTE. Initially had shock requiring vasopressors.   - Continue supportive care.    Sepsis secondary to Serratia PNA - s/p course cefepime (end date 2/9).  Had worsening leukocytosis following cessation of cefepime; however, patient is also on decadron (67m q6hr started 2/10). BAL 2/10 with GS revealing rare Gram - rods, candida (likely colonization)  PCT 6.04, but in setting of impaired renal function this is not a great marker - Continue to wean steroids (dropped to 446m2/14). - Discussed with pharmacy. No clear infectious source and CXR does not look overtly concerning for focal PNAs. Abx discontinued 2/14 and plan to closely monitor fever curve / leukocytosis.   Rest per primary team.   RaMontey HoraPA - [facility] 08/29/2019, 10:21 AM   PCCM attending:  [age]ear old gentleman admitted status post PEA cardiac arrest.  Patient had a left-sided pneumothorax.  Still has left-sided hydropneumothorax with persistent leak.  Chest tubes remain in place.  Pulmonary consulted on the floor for chest tube management.  BP 126/69 (BP Location: Right Arm)   Pulse 95   Temp 99.1 F (37.3 C) (Oral)   Resp (!) 24   Ht _0  (1.803 m)   Wt 63.1 kg   SpO2 97%   BMI 19.40 kg/m   General: Elderly male resting in bed comfortable. HEENT: Trachea midline, tracking appropriately Heart: Regular rate rhythm, S1-S2 Lungs: Clear to auscultation bilaterally, no crackles no wheezes Extremities: No significant edema  Labs: Reviewed Chest x-ray: Stable left-sided chest tube positioning no right-sided pneumo following thoracentesis by IR.  Assessment: Acute hypoxemic respiratory failure, resolving now on nasal cannula O2 Left hydropneumothorax Status post cardiac arrest, resolved Small right-sided pleural effusion status post thoracentesis by interventional  radiology, appears transudative by protein, however LDH is elevated,  Plan: Patient with small air leak.  Chest tube remains in place to suction. We will follow output consider pulling once leak is resolved and output is improved. I have added a serum LDH, serum protein and albumin to be checked. We can compare this with the pleural fluid analysis that has been completed on the right sided pleural fluid. Pulmonary will follow tomorrow.  BrGarner NashDO [facility] 08/29/2019 4:27 PM

## 2019-08-29 NOTE — Procedures (Signed)
PROCEDURE SUMMARY:  Successful US guided right diagnostic thoracentesis. Yielded 200 mL of yellow fluid. Pt tolerated procedure well. No immediate complications.  Specimen was sent for labs. CXR ordered.  EBL < 5 mL  Hoyt Koch PA-C 08/29/2019 11:19 AM

## 2019-08-29 NOTE — Progress Notes (Signed)
Pt is currently unavailable to do flutter valve. Will attempt at a later time.

## 2019-08-29 NOTE — Progress Notes (Signed)
Attempted to call pt's sister twice to update her on plan of care and transfer to 4E.

## 2019-08-29 NOTE — Progress Notes (Signed)
Inpatient Rehab Admissions:  Inpatient Rehab Consult received.  I met with patient at the bedside for rehabilitation assessment and to discuss goals and expectations of an inpatient rehab admission.  Pt still confused, says he's wanting to walk on out of here.  Note still has multiple chest tubes to suction, rectal pouch and is not medically ready for CIR. Will follow for progress with therapy to determine if pt ultimately requires CIR stay.   Signed: Shann Medal, PT, DPT Admissions Coordinator [phone] 08/29/19  1:17 PM

## 2019-08-29 NOTE — Progress Notes (Signed)
Pt stating he is not able to tolerate venti-mask. Place back on [place] @5L - now sats are remaining 95% and above- I notified Resp- KIm with this information and will continue to monitor closely.

## 2019-08-29 NOTE — Progress Notes (Signed)
Pt transferred to IR by RN and transport. Pt has no complaints at this time. Cardiac monitor switched to IR monitor and hand off given.

## 2019-08-29 NOTE — Progress Notes (Addendum)
  Speech Language Pathology Treatment: Dysphagia  Patient Details Name: Philip Wells MRN: [id number] DOB: 1955-11-12 Today's Date: 08/29/2019 Time: 0998-3382 SLP Time Calculation (min) (ACUTE ONLY): 12 min  Assessment / Plan / Recommendation Clinical Impression  Patient received in bed, alert, with low vocal volume. Patient with chest tubes in place. Per tele, oxygen saturation between 97-100% and RR 17-25 during session. Pt underwent thoracentesis earlier this date. Patient reports he didn't eat very much, stating "I just don't eat much" when asked to elucidate. Patient provided education re: his MBSS conducted 08/28/19 recommending nectar thick liquids (observance of silent aspiration with thin liquids during MBSS) and dysphagia 3 solids. Pt verbalized understanding and stated "I've been drinking that thick stuff."  Patient seen with cup sips of NTL. Adequate oral acceptance, AP transfer. 1 instance of immediate throat clear, no other overt s/sx aspiration or distress. Pt seen with puree solids (applesauce): no overt s/sx aspiration. Patient refusing other solids. Pt offered peaches, broccoli, macaroni and cheese and meatloaf (from his meal tray). He refused all items, stating "just give me the applesauce."  Nursing entered room to check on chest tubes, session ended. Rn educated re: diet recommendations, she verbalized understanding.  Recommend continuation of dysphagia 3 solids, NTL. Pt to be upright for all PO, small bites/sips. Oral care QID. ST to follow as per POC.   HPI HPI: Mr Adler Chartrand, 63y/m, presented to ED with unexplained cardiac arrest. Intubated 2/2-2/8. Reintubated from 2/10-2/13 due to serratia PNA. No known PMH      SLP Plan  Continue with current plan of care       Recommendations  Diet recommendations: Dysphagia 3 (mechanical soft);Nectar-thick liquid Medication Administration: Whole meds with puree Supervision: Staff to assist with self  feeding;Intermittent supervision to cue for compensatory strategies Compensations: Minimize environmental distractions;Slow rate;Small sips/bites Postural Changes and/or Swallow Maneuvers: Seated upright 90 degrees;Upright 30-60 min after meal                Oral Care Recommendations: Oral care QID Follow up Recommendations: Skilled Nursing facility SLP Visit Diagnosis: Dysphagia, oropharyngeal phase (R13.12) Plan: Continue with current plan of care       GO                Daniyal Tabor 08/29/2019, 2:23 PM Shella Spearing, M.Ed., CCC-SLP Speech Therapy Acute Rehabilitation [phone]: Acute Rehab office [phone] - pager

## 2019-08-29 NOTE — Progress Notes (Signed)
Rehab Admissions Coordinator Note:  Patient was screened by Stephania Fragmin for appropriateness for an Inpatient Acute Rehab Consult.  At this time, we are recommending Inpatient Rehab consult.  I will place a consult per protocol.   Stephania Fragmin 08/29/2019, 10:03 AM  I can be reached at [phone].

## 2019-08-30 ENCOUNTER — Inpatient Hospital Stay (HOSPITAL_COMMUNITY): Payer: Self-pay

## 2019-08-30 DIAGNOSIS — I2699 Other pulmonary embolism without acute cor pulmonale: Secondary | ICD-10-CM

## 2019-08-30 DIAGNOSIS — I482 Chronic atrial fibrillation, unspecified: Secondary | ICD-10-CM

## 2019-08-30 DIAGNOSIS — I96 Gangrene, not elsewhere classified: Secondary | ICD-10-CM

## 2019-08-30 DIAGNOSIS — I469 Cardiac arrest, cause unspecified: Secondary | ICD-10-CM

## 2019-08-30 DIAGNOSIS — I998 Other disorder of circulatory system: Secondary | ICD-10-CM

## 2019-08-30 LAB — COMPREHENSIVE METABOLIC PANEL
ALT: 74 U/L — ABNORMAL HIGH (ref 0–44)
AST: 61 U/L — ABNORMAL HIGH (ref 15–41)
Albumin: 1.6 g/dL — ABNORMAL LOW (ref 3.5–5.0)
Alkaline Phosphatase: 102 U/L (ref 38–126)
Anion gap: 11 (ref 5–15)
BUN: 79 mg/dL — ABNORMAL HIGH (ref 8–23)
CO2: 33 mmol/L — ABNORMAL HIGH (ref 22–32)
Calcium: 8.5 mg/dL — ABNORMAL LOW (ref 8.9–10.3)
Chloride: 109 mmol/L (ref 98–111)
Creatinine, Ser: 2.57 mg/dL — ABNORMAL HIGH (ref 0.61–1.24)
GFR calc Af Amer: 29 mL/min — ABNORMAL LOW (ref >60)
GFR calc non Af Amer: 25 mL/min — ABNORMAL LOW (ref >60)
Glucose, Bld: 100 mg/dL — ABNORMAL HIGH (ref 70–99)
Potassium: 3.3 mmol/L — ABNORMAL LOW (ref 3.5–5.1)
Sodium: 153 mmol/L — ABNORMAL HIGH (ref 135–145)
Total Bilirubin: 1.1 mg/dL (ref 0.3–1.2)
Total Protein: 5.4 g/dL — ABNORMAL LOW (ref 6.5–8.1)

## 2019-08-30 LAB — CBC WITH DIFFERENTIAL/PLATELET
Abs Immature Granulocytes: 0.1 10*3/uL — ABNORMAL HIGH (ref 0.00–0.07)
Basophils Absolute: 0 10*3/uL (ref 0.0–0.1)
Basophils Relative: 0 %
Eosinophils Absolute: 0 10*3/uL (ref 0.0–0.5)
Eosinophils Relative: 0 %
HCT: 31.1 % — ABNORMAL LOW (ref 39.0–52.0)
Hemoglobin: 10.1 g/dL — ABNORMAL LOW (ref 13.0–17.0)
Immature Granulocytes: 1 %
Lymphocytes Relative: 4 %
Lymphs Abs: 0.7 10*3/uL (ref 0.7–4.0)
MCH: 31.7 pg (ref 26.0–34.0)
MCHC: 32.5 g/dL (ref 30.0–36.0)
MCV: 97.5 fL (ref 80.0–100.0)
Monocytes Absolute: 1.2 10*3/uL — ABNORMAL HIGH (ref 0.1–1.0)
Monocytes Relative: 7 %
Neutro Abs: 15.7 10*3/uL — ABNORMAL HIGH (ref 1.7–7.7)
Neutrophils Relative %: 88 %
Platelets: 312 10*3/uL (ref 150–400)
RBC: 3.19 MIL/uL — ABNORMAL LOW (ref 4.22–5.81)
RDW: 14.3 % (ref 11.5–15.5)
WBC: 17.7 10*3/uL — ABNORMAL HIGH (ref 4.0–10.5)
nRBC: 0 % (ref 0.0–0.2)

## 2019-08-30 LAB — PHOSPHORUS: Phosphorus: 4.2 mg/dL (ref 2.5–4.6)

## 2019-08-30 LAB — GLUCOSE, CAPILLARY
Glucose-Capillary: 102 mg/dL — ABNORMAL HIGH (ref 70–99)
Glucose-Capillary: 108 mg/dL — ABNORMAL HIGH (ref 70–99)
Glucose-Capillary: 116 mg/dL — ABNORMAL HIGH (ref 70–99)
Glucose-Capillary: 165 mg/dL — ABNORMAL HIGH (ref 70–99)
Glucose-Capillary: 167 mg/dL — ABNORMAL HIGH (ref 70–99)
Glucose-Capillary: 91 mg/dL (ref 70–99)

## 2019-08-30 LAB — ALBUMIN, FLUID (OTHER): Albumin, Body Fluid Other: 0.7 g/dL

## 2019-08-30 LAB — MAGNESIUM: Magnesium: 1.2 mg/dL — ABNORMAL LOW (ref 1.7–2.4)

## 2019-08-30 LAB — PATHOLOGIST SMEAR REVIEW

## 2019-08-30 MED ORDER — POTASSIUM CHLORIDE 20 MEQ/15ML (10%) PO SOLN: 40.0000 meq | Freq: Two times a day (BID) | ORAL | Status: DC

## 2019-08-30 MED ORDER — LORAZEPAM 2 MG/ML IJ SOLN
2.0000 mg | Freq: Four times a day (QID) | INTRAMUSCULAR | Status: DC | PRN
  Administered 2019-08-30 – 2019-09-01 (×5): 2 mg via INTRAVENOUS
  Filled 2019-08-30 (×5): qty 1

## 2019-08-30 MED ORDER — MAGNESIUM SULFATE 50 % IJ SOLN
3.0000 g | Freq: Once | INTRAVENOUS | Status: AC
  Administered 2019-08-30: 3 g via INTRAVENOUS
  Filled 2019-08-30: qty 6

## 2019-08-30 MED ORDER — POTASSIUM CHLORIDE 10 MEQ/100ML IV SOLN
10.0000 meq | INTRAVENOUS | Status: AC
  Administered 2019-08-30 – 2019-08-31 (×4): 10 meq via INTRAVENOUS
  Filled 2019-08-30 (×3): qty 100

## 2019-08-30 NOTE — Consult Note (Signed)
Reason for Consult:Left thumb ischemia Referring Physician: De Burrs  Philip Wells is an 64 y.o. male.  HPI: Philip Wells was admitted 2/2 in PEA cardiac arrest. He underwent multiple rounds of CPR and has required multiple different treatments with pressors for distributive shock. It was noted today that he had an ischemic left thumb and hand surgery was consulted. He denies any pain with it. He definitely did not have it before he was admitted. He is RHD.  History reviewed. No pertinent past medical history.  Past Surgical History:  Procedure Laterality Date  . IR THORACENTESIS ASP PLEURAL SPACE W/IMG GUIDE  08/29/2019    History reviewed. No pertinent family history.  Social History:  reports that he has never smoked. He has never used smokeless tobacco. No history on file for alcohol and drug.  Allergies: No Known Allergies  Medications: I have reviewed the patient's current medications.  Results for orders placed or performed during the hospital encounter of 08/22/2019 (from the past 48 hour(s))  Glucose, capillary     Status: None   Collection Time: 08/28/19  3:25 PM  Result Value Ref Range   Glucose-Capillary 97 70 - 99 mg/dL  Glucose, capillary     Status: None   Collection Time: 08/28/19  7:55 PM  Result Value Ref Range   Glucose-Capillary 96 70 - 99 mg/dL  Glucose, capillary     Status: Abnormal   Collection Time: 08/29/19  3:24 AM  Result Value Ref Range   Glucose-Capillary 106 (H) 70 - 99 mg/dL   Comment 1 Notify RN    Comment 2 Document in Chart   CBC     Status: Abnormal   Collection Time: 08/29/19  3:48 AM  Result Value Ref Range   WBC 20.6 (H) 4.0 - 10.5 K/uL   RBC 3.48 (L) 4.22 - 5.81 MIL/uL   Hemoglobin 10.9 (L) 13.0 - 17.0 g/dL   HCT 82.9 (L) 93.7 - 16.9 %   MCV 95.1 80.0 - 100.0 fL   MCH 31.3 26.0 - 34.0 pg   MCHC 32.9 30.0 - 36.0 g/dL   RDW 67.8 93.8 - 10.1 %   Platelets 314 150 - 400 K/uL   nRBC 0.0 0.0 - 0.2 %    Comment: Performed at [facility], [address] [place], [place] [address]  Comprehensive metabolic panel     Status: Abnormal   Collection Time: 08/29/19  3:48 AM  Result Value Ref Range   Sodium 149 (H) 135 - 145 mmol/L   Potassium 3.3 (L) 3.5 - 5.1 mmol/L   Chloride 104 98 - 111 mmol/L   CO2 32 22 - 32 mmol/L   Glucose, Bld 108 (H) 70 - 99 mg/dL   BUN 90 (H) 8 - 23 mg/dL   Creatinine, Ser 5.85 (H) 0.61 - 1.24 mg/dL   Calcium 8.5 (L) 8.9 - 10.3 mg/dL   Total Protein 5.1 (L) 6.5 - 8.1 g/dL   Albumin 1.5 (L) 3.5 - 5.0 g/dL   AST 92 (H) 15 - 41 U/L   ALT 96 (H) 0 - 44 U/L   Alkaline Phosphatase 112 38 - 126 U/L   Total Bilirubin 0.8 0.3 - 1.2 mg/dL   GFR calc non Af Amer 21 (L) >60 mL/min   GFR calc Af Amer 24 (L) >60 mL/min   Anion gap 13 5 - 15    Comment: Performed at [facility], [address] [place], [place] [address]  Magnesium  Status: Abnormal   Collection Time: 08/29/19  3:48 AM  Result Value Ref Range   Magnesium 1.4 (L) 1.7 - 2.4 mg/dL    Comment: Performed at [facility], [address], [place], [place] [address]  Glucose, capillary     Status: None   Collection Time: 08/29/19  7:31 AM  Result Value Ref Range   Glucose-Capillary 86 70 - 99 mg/dL  Lactate dehydrogenase (pleural or peritoneal fluid)     Status: Abnormal   Collection Time: 08/29/19 11:47 AM  Result Value Ref Range   LD, Fluid 222 (H) 3 - 23 U/L    Comment: (NOTE) Results should be evaluated in conjunction with serum values    Fluid Type-FLDH PLEU RIGHT     Comment: Performed at [facility], [address], [place], [place] [address] CORRECTED ON 02/15 AT 1241: PREVIOUSLY REPORTED AS Lung, Right   Body fluid cell count with differential     Status: Abnormal   Collection Time: 08/29/19 11:47 AM  Result Value Ref Range   Fluid Type-FCT PLEU RIGHT     Comment: CORRECTED ON 02/15 AT 1241: PREVIOUSLY REPORTED AS Lung, Right   Color, Fluid AMBER (A) YELLOW   Appearance, Fluid HAZY (A) CLEAR    Total Nucleated Cell Count, Fluid 167 0 - 1,000 cu mm   Neutrophil Count, Fluid 61 (H) 0 - 25 %   Lymphs, Fluid 20 %   Monocyte-Macrophage-Serous Fluid 19 (L) 50 - 90 %   Eos, Fluid 0 %    Comment: Performed at [facility], [address] [place], [place] [address]  Gram stain     Status: None   Collection Time: 08/29/19 11:47 AM   Specimen: Lung, Right; Pleural Fluid  Result Value Ref Range   Specimen Description PLEURAL RIGHT    Special Requests NONE    Gram Stain      RARE WBC PRESENT,BOTH PMN AND MONONUCLEAR NO ORGANISMS SEEN Performed at [facility], [address], [place], [place] [address]    Report Status 08/29/2019 FINAL   Protein, pleural or peritoneal fluid     Status: None   Collection Time: 08/29/19 11:47 AM  Result Value Ref Range   Total protein, fluid <3.0 g/dL    Comment: (NOTE) No normal range established for this test Results should be evaluated in conjunction with serum values    Fluid Type-FTP PLEU RIGHT     Comment: Performed at [facility], [address], [place], [place] [address] CORRECTED ON 02/15 AT 1241: PREVIOUSLY REPORTED AS Lung, Right   Glucose, pleural or peritoneal fluid     Status: None   Collection Time: 08/29/19 11:47 AM  Result Value Ref Range   Glucose, Fluid 103 mg/dL    Comment: (NOTE) No normal range established for this test Results should be evaluated in conjunction with serum values    Fluid Type-FGLU PLEU RIGHT     Comment: Performed at [facility], [address], [place], [place] [address] CORRECTED ON 02/15 AT 1241: PREVIOUSLY REPORTED AS Lung, Right   Culture, body fluid-bottle     Status: None (Preliminary result)   Collection Time: 08/29/19 11:47 AM   Specimen: Pleura  Result Value Ref Range   Specimen Description PLEURAL RIGHT    Special Requests NONE    Culture      NO GROWTH < 24 HOURS Performed at [facility], [address] [place],  [place] [id number]    Report Status  PENDING   Glucose, capillary     Status: Abnormal   Collection Time: 08/29/19 12:12 PM  Result Value Ref Range   Glucose-Capillary 112 (H) 70 - 99 mg/dL  Glucose, capillary     Status: None   Collection Time: 08/29/19  4:34 PM  Result Value Ref Range   Glucose-Capillary 97 70 - 99 mg/dL  Comprehensive metabolic panel     Status: Abnormal   Collection Time: 08/29/19  4:50 PM  Result Value Ref Range   Sodium 150 (H) 135 - 145 mmol/L   Potassium 3.2 (L) 3.5 - 5.1 mmol/L   Chloride 104 98 - 111 mmol/L   CO2 33 (H) 22 - 32 mmol/L   Glucose, Bld 102 (H) 70 - 99 mg/dL   BUN 82 (H) 8 - 23 mg/dL   Creatinine, Ser 7.822.74 (H) 0.61 - 1.24 mg/dL   Calcium 8.5 (L) 8.9 - 10.3 mg/dL   Total Protein 5.1 (L) 6.5 - 8.1 g/dL   Albumin 1.6 (L) 3.5 - 5.0 g/dL   AST 77 (H) 15 - 41 U/L   ALT 88 (H) 0 - 44 U/L   Alkaline Phosphatase 103 38 - 126 U/L   Total Bilirubin 1.0 0.3 - 1.2 mg/dL   GFR calc non Af Amer 23 (L) >60 mL/min   GFR calc Af Amer 27 (L) >60 mL/min   Anion gap 13 5 - 15    Comment: Performed at [facility], [address] [place], [place] [address]  Lactate dehydrogenase     Status: Abnormal   Collection Time: 08/29/19  4:50 PM  Result Value Ref Range   LDH 396 (H) 98 - 192 U/L    Comment: Performed at [facility], [address] [place], [place] [address]  Glucose, capillary     Status: None   Collection Time: 08/29/19  8:47 PM  Result Value Ref Range   Glucose-Capillary 88 70 - 99 mg/dL  Glucose, capillary     Status: Abnormal   Collection Time: 08/29/19 11:56 PM  Result Value Ref Range   Glucose-Capillary 167 (H) 70 - 99 mg/dL  Glucose, capillary     Status: Abnormal   Collection Time: 08/30/19  4:15 AM  Result Value Ref Range   Glucose-Capillary 116 (H) 70 - 99 mg/dL  Glucose, capillary     Status: Abnormal   Collection Time: 08/30/19  8:27 AM  Result Value Ref Range   Glucose-Capillary 102 (H) 70 - 99 mg/dL  CBC with Differential/Platelet     Status:  Abnormal   Collection Time: 08/30/19  9:29 AM  Result Value Ref Range   WBC 17.7 (H) 4.0 - 10.5 K/uL   RBC 3.19 (L) 4.22 - 5.81 MIL/uL   Hemoglobin 10.1 (L) 13.0 - 17.0 g/dL   HCT 78.431.1 (L) 69.639.0 - 29.552.0 %   MCV 97.5 80.0 - 100.0 fL   MCH 31.7 26.0 - 34.0 pg   MCHC 32.5 30.0 - 36.0 g/dL   RDW 28.414.3 13.211.5 - 44.015.5 %   Platelets 312 150 - 400 K/uL    Comment: REPEATED TO VERIFY   nRBC 0.0 0.0 - 0.2 %   Neutrophils Relative % 88 %   Neutro Abs 15.7 (H) 1.7 - 7.7 K/uL   Lymphocytes Relative 4 %   Lymphs Abs 0.7 0.7 - 4.0 K/uL   Monocytes Relative 7 %   Monocytes Absolute 1.2 (H) 0.1 - 1.0 K/uL  Eosinophils Relative 0 %   Eosinophils Absolute 0.0 0.0 - 0.5 K/uL   Basophils Relative 0 %   Basophils Absolute 0.0 0.0 - 0.1 K/uL   Immature Granulocytes 1 %   Abs Immature Granulocytes 0.10 (H) 0.00 - 0.07 K/uL    Comment: Performed at [facility], [address], [place], [place] [address]  Comprehensive metabolic panel     Status: Abnormal   Collection Time: 08/30/19  9:29 AM  Result Value Ref Range   Sodium 153 (H) 135 - 145 mmol/L   Potassium 3.3 (L) 3.5 - 5.1 mmol/L   Chloride 109 98 - 111 mmol/L   CO2 33 (H) 22 - 32 mmol/L   Glucose, Bld 100 (H) 70 - 99 mg/dL   BUN 79 (H) 8 - 23 mg/dL   Creatinine, Ser 9.48 (H) 0.61 - 1.24 mg/dL   Calcium 8.5 (L) 8.9 - 10.3 mg/dL   Total Protein 5.4 (L) 6.5 - 8.1 g/dL   Albumin 1.6 (L) 3.5 - 5.0 g/dL   AST 61 (H) 15 - 41 U/L   ALT 74 (H) 0 - 44 U/L   Alkaline Phosphatase 102 38 - 126 U/L   Total Bilirubin 1.1 0.3 - 1.2 mg/dL   GFR calc non Af Amer 25 (L) >60 mL/min   GFR calc Af Amer 29 (L) >60 mL/min   Anion gap 11 5 - 15    Comment: Performed at [facility], [address], [place], [place] [address]  Magnesium     Status: Abnormal   Collection Time: 08/30/19  9:29 AM  Result Value Ref Range   Magnesium 1.2 (L) 1.7 - 2.4 mg/dL    Comment: Performed at [facility], [address] [place], [place] [address]  Phosphorus      Status: None   Collection Time: 08/30/19  9:29 AM  Result Value Ref Range   Phosphorus 4.2 2.5 - 4.6 mg/dL    Comment: Performed at [facility], [address] [place], [place] [address]  Glucose, capillary     Status: None   Collection Time: 08/30/19 12:03 PM  Result Value Ref Range   Glucose-Capillary 91 70 - 99 mg/dL    DG Chest 1 View  Result Date: 08/29/2019 CLINICAL DATA:  Thoracentesis today EXAM: CHEST  1 VIEW COMPARISON:  Yesterday FINDINGS: Decrease in right pleural effusion. Two left-sided chest tubes with unchanged small to moderate pneumothorax. Unchanged interstitial opacities. Normal heart size. Left subclavian line with tip at the upper SVC. IMPRESSION: 1. No right-sided pneumothorax after thoracentesis. 2. Unchanged left chest tube positioning and pneumothorax. Electronically Signed   By: Marnee Spring M.D.   On: 08/29/2019 11:33   DG Chest Port 1 View  Result Date: 08/30/2019 CLINICAL DATA:  Pneumothorax.  Chest tubes. EXAM: PORTABLE CHEST 1 VIEW COMPARISON:  08/29/2019 08/28/2019.  08/23/2019.  09/07/2019. FINDINGS: Two left chest tubes in stable position. Left subclavian line in stable position. Tiny left apical pneumothorax, improved from prior exam. Trachea is slightly shifted to the right. A left paratracheal mass cannot be completely excluded. An upright PA and lateral chest x-ray suggested for further evaluation. Unchanged right upper lung and bibasilar infiltrates. Small bilateral pleural effusions again noted. Right-sided pleural thickening again noted without interim change. Oral contrast in the colon. Degenerative change thoracic spine. IMPRESSION: 1. Two left chest tubes in stable position. Left subclavian line in stable position. Tiny left apical pneumothorax again noted. Interim improvement from prior  exam. 2. The trachea is slightly shifted to the right. A left paratracheal mass cannot be excluded. An upright PA and lateral chest x-ray suggested for  further evaluation. 3. Patchy bilateral pulmonary infiltrates unchanged from prior exam. Small bilateral pleural effusions again noted. Electronically Signed   By: Maisie Fus  Register   On: 08/30/2019 08:40   DG Chest Port 1 View  Result Date: 08/29/2019 CLINICAL DATA:  Chest tube positions, evaluate for pneumothorax EXAM: PORTABLE CHEST 1 VIEW COMPARISON:  08/29/2019 at 1122 hours FINDINGS: Two indwelling left chest tubes. Small left apical pneumothorax, unchanged. Small right pleural effusion and small left basilar pleural fluid component. Stable right upper lobe and bibasilar opacities. Mild cardiomegaly. Left subclavian venous catheter terminates in the mid SVC. IMPRESSION: Two indwelling left chest tubes. Small left apical pneumothorax, unchanged. Additional stable findings as above. Electronically Signed   By: Charline Bills M.D.   On: 08/29/2019 19:30   DG CHEST PORT 1 VIEW  Result Date: 08/29/2019 CLINICAL DATA:  Pneumothorax with chest tubes in place EXAM: PORTABLE CHEST 1 VIEW COMPARISON:  August 28, 2019 FINDINGS: Chest tubes are again noted on the left. There is been increase in pneumothorax along the left lateral and left basilar regions with stable apical pneumothorax on the left. No appreciable tension component. Central catheter tip is in the superior vena cava. Feeding tube no longer evident. There is a right pleural effusion which may be slightly larger. There is bibasilar atelectasis as well as atelectasis in the right mid lung. Heart is upper normal in size with pulmonary vascularity normal. No adenopathy. No bone lesions. IMPRESSION: Stable chest tubes on the left. There has been increase in the left pneumothorax along its lateral and basilar regions with stable small pneumothorax in the left apex. No tension component. Stable right pleural effusion. Areas of atelectatic change bilaterally, more on the right than the left, remains stable. Stable cardiac silhouette. These results will  be called to the ordering clinician or representative by the Radiologist Assistant, and communication documented in the PACS or zVision Dashboard. Electronically Signed   By: Bretta Bang III M.D.   On: 08/29/2019 08:06   IR THORACENTESIS ASP PLEURAL SPACE W/IMG GUIDE  Result Date: 08/29/2019 INDICATION: Patient with PEA arrest, left hydropneumothorax status post chest placement x2. Now with right sided pleural effusion. Request is made for diagnostic and therapeutic right thoracentesis. EXAM: ULTRASOUND GUIDED RIGHT DIAGNOSTIC AND THERAPEUTIC THORACENTESIS MEDICATIONS: 10 mL 1% lidocaine COMPLICATIONS: None immediate. PROCEDURE: An ultrasound guided thoracentesis was thoroughly discussed with the patient and questions answered. The benefits, risks, alternatives and complications were also discussed. The patient understands and wishes to proceed with the procedure. Written consent was obtained. Ultrasound was performed to localize and mark an adequate pocket of fluid in the right chest. The area was then prepped and draped in the normal sterile fashion. 1% Lidocaine was used for local anesthesia. Under ultrasound guidance a 6 Fr Safe-T-Centesis catheter was introduced. Thoracentesis was performed. The catheter was removed and a dressing applied. FINDINGS: A total of approximately 200 mL of yellow fluid was removed. Samples were sent to the laboratory as requested by the clinical team. IMPRESSION: Successful ultrasound guided diagnostic and therapeutic right thoracentesis yielding 200 mL of pleural fluid. Read by: Loyce Dys PA-C Electronically Signed   By: Richarda Overlie M.D.   On: 08/29/2019 11:49    Review of Systems  HENT: Negative for ear discharge, ear pain, hearing loss and tinnitus.   Eyes: Negative for photophobia and  pain.  Respiratory: Negative for cough and shortness of breath.   Cardiovascular: Negative for chest pain.  Gastrointestinal: Negative for abdominal pain, nausea and vomiting.   Genitourinary: Negative for dysuria, flank pain, frequency and urgency.  Musculoskeletal: Negative for back pain, myalgias (Left thumb) and neck pain.  Neurological: Negative for dizziness and headaches.  Hematological: Does not bruise/bleed easily.  Psychiatric/Behavioral: The patient is not nervous/anxious.    Blood pressure 131/72, pulse (!) 123, temperature 97.6 F (36.4 C), temperature source Oral, resp. rate 20, height 5\' 11"  (1.803 m), weight 63.1 kg, SpO2 95 %. Physical Exam  Constitutional: He appears well-developed and well-nourished. No distress.  HENT:  Head: Normocephalic and atraumatic.  Eyes: Conjunctivae are normal. Right eye exhibits no discharge. Left eye exhibits no discharge. No scleral icterus.  Cardiovascular: Normal rate and regular rhythm.  Respiratory: Effort normal. No respiratory distress.  Musculoskeletal:     Cervical back: Normal range of motion.     Comments: Left shoulder, elbow, wrist, digits- Thumb necrotic with dry gangrene, woody, nontender, insensate, no instability, no blocks to motion  Sens  Ax/R/M/U intact  Mot   Ax/ R/ PIN/ M/ AIN/ U intact  Rad 0 (no dopplerable pulse but ulnar and palmar arch with doppler signals.)  Neurological: He is alert.  Skin: Skin is warm and dry. He is not diaphoretic.  Psychiatric: He has a normal mood and affect. His behavior is normal.    Assessment/Plan: Ischemic left thumb -- Have consulted vascular surgery given lack of radial pulse. No chance of reversal of thumb ischemia. No urgent need for hand surgery intervention at this time. Dr. Amedeo Plenty to follow up this evening. Multiple medical problems including shock, s/p PEA arrest, HPTX, pleural effusion, and sepsis -- per primary team    Lisette Abu, PA-C Orthopedic Surgery [phone] 08/30/2019, 12:56 PM

## 2019-08-30 NOTE — Progress Notes (Signed)
Pt refused to take PO potassium this am after multiple attempts. Pt educated on importance of taking medications. Assessment is limited due to pt's noncompliance with nursing staff instructions. MD made aware. Pt very angry that staff keeps "messing with him". Pt was informed that everyone is just trying to take care of him. Pt had family member hand him $20 out of his belongings bag and put it in his sock. Pt instructed to keep money put away so that it doesn't get misplaced in bed linens, etc.. Pt refused to move money out of sock. Pt educated of belongings policy and that we are not responsible for it if it gets misplaced. Pt continued to refuse to removed money from sock.

## 2019-08-30 NOTE — Progress Notes (Signed)
Physical Therapy Treatment Patient Details Name: Philip Wells MRN: [id number] DOB: September 14, 1955 Today's Date: 08/30/2019    History of Present Illness Patient is a 64 y/o male admitted 09/14/19 after unwitnessed cardiac arrest with 6 rounds of CPR and subsequent distributive shock, presumably due to sepsis. ETT 2/2-2/8. Chest tube insertion x2 on 2/2 and 2/3. CXR 2/6 with RLL infiltrate. Small residual left-sided PTX. 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 S/p bronchoscopy with extraction of tooth from airway on 2/10; remained intubated post-op with extubation 2/13. Pt with L thumb ischemia; LUE duplex 2/16 showed obstruction in distal radial artery. No PMH on file.   PT Comments    Pt awake with eyes wide open, but not talking, not following commands and requiring totalA for mobility and repositioning. Increased tone noted in BUEs and RLE with ROM, pt tolerated PROM despite initial resistance to any mobility. BP 193/146, HR 103-110s; RN notified and reports pt recently received ativan due to agitation.   Follow Up Recommendations  CIR;Supervision/Assistance - 24 hour     Equipment Recommendations  (defer)    Recommendations for Other Services       Precautions / Restrictions Precautions Precautions: Fall;Other (comment) Precaution Comments: Multiple lines, 2x chest tubes, rectal tube Restrictions Weight Bearing Restrictions: No    Mobility  Bed Mobility Overal bed mobility: Needs Assistance Bed Mobility: Rolling           General bed mobility comments: Pt resisting mobility and attempts to reposition despite totalA  Transfers                    Ambulation/Gait                 Stairs             Wheelchair Mobility    Modified Rankin (Stroke Patients Only)       Balance                                            Cognition Arousal/Alertness:  Awake/alert;Lethargic Behavior During Therapy: Flat affect Overall Cognitive Status: Impaired/Different from baseline                                 General Comments: Pt with eyes wide open and twitching; not talking or answering questions (except for mumbling once but difficult to understand), not following commands, resisting mobility. RN reports recent received ativan due to agitation      Exercises Other Exercises Other Exercises: Increased flexor tone in BUEs with PROM, able to progress to full elbow and wrist extension with gentle ROM - pt lateral reaching back of head with both UEs Other Exercises: Tolerated LLE hip/knee PROM without resistance; RLE resistant to knee/hip flexion and extension, increased tone    General Comments        Pertinent Vitals/Pain Pain Assessment: Faces Faces Pain Scale: Hurts a little bit Pain Location: Grimacing and resisting ROM, unable to specify pain Pain Descriptors / Indicators: Grimacing Pain Intervention(s): Monitored during session;Limited activity within patient's tolerance;Repositioned    Home Living                      Prior Function            PT Goals (current  goals can now be found in the care plan section) Progress towards PT goals: Not progressing toward goals - comment(not following commands, likely secondary to medication)    Frequency    Min 3X/week      PT Plan Current plan remains appropriate    Co-evaluation              AM-PAC PT "6 Clicks" Mobility   Outcome Measure  Help needed turning from your back to your side while in a flat bed without using bedrails?: Total Help needed moving from lying on your back to sitting on the side of a flat bed without using bedrails?: Total Help needed moving to and from a bed to a chair (including a wheelchair)?: Total Help needed standing up from a chair using your arms (e.g., wheelchair or bedside chair)?: Total Help needed to walk in  hospital room?: Total Help needed climbing 3-5 steps with a railing? : Total 6 Click Score: 6    End of Session Equipment Utilized During Treatment: Oxygen Activity Tolerance: Other (comment) Patient left: in bed;with call bell/phone within reach;with bed alarm set Nurse Communication: Mobility status PT Visit Diagnosis: Muscle weakness (generalized) (M62.81)     Time: 6568-1275 PT Time Calculation (min) (ACUTE ONLY): 15 min  Charges:  $Therapeutic Activity: 8-22 mins                    Mabeline Caras, PT, DPT Acute Rehabilitation Services  Pager [phone] Office [place] 08/30/2019, 5:19 PM

## 2019-08-30 NOTE — Progress Notes (Signed)
Pt becoming increasingly aggressive. Pt is cussing and swatting at staff. Pt yelling and demanding that staff do what he wants. Trying to get out of the bed. Refusing to let staff take care of him. No safety sitters available at this time. Notified MD.

## 2019-08-30 NOTE — Consult Note (Addendum)
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  He was admitted on 09/09/2019 with a cardiac arrest and was in shock for some time on pressors.  I suspect that the gangrenous thumb is related to his pressors or his left radial A-line.  He does have a palpable brachial pulse bilaterally.  He has a history of atrial fibrillation but is currently in sinus rhythm and I think that it is less likely that this is related to an embolic event.  Given his chronic kidney disease he is not a good candidate for CT angiogram or arteriography and for this reason we have ordered a duplex.  He has a biphasic ulnar and palmar arch signal on the left.  We could consider arteriography in the future if his renal function improves.  I will follow up on his duplex once this is complete.   Reason for Consult:  Ischemic left thumb Requesting Physician:  Charma Igo PA-C MRN #:  [id number]  History of Present Illness: This is a 64 y.o. male  was admitted on 08/30/2019 with PEA cardiac arrest with unknown down time requiring 6 rounds of CPR. Found to be in undifferentiated shock with sepsis. He was treated with targeted temperature management and required pressors. This was complicated by findings of aspiration pneumonia and new onset atrial fibrillation with RVR. He required reintubation with ventilation support. He was finally extubated, transferred out of the ICU on 2/14.    Patient is a poor historian so most of this information was obtained from medical records. However it was noted that he had some ischemic changes to his left thumb today and vascular has been asked to evaluate him.  Per patient he is unsure of how long his left thumb has looked like this. He denies any pain. He denies any numbness of weakness of his left arm or hand. He did have have a left radial Aline which was taken out  on 2/2. He is right handed. He denies any previous upper extremity surgeries  History reviewed. No pertinent past medical history.   Past Surgical History:  Procedure Laterality Date  . IR THORACENTESIS ASP PLEURAL SPACE W/IMG GUIDE  08/29/2019    No Known Allergies  Prior to Admission medications   Medication Sig Start Date End Date Taking? Authorizing Provider  metFORMIN (GLUCOPHAGE) 850 MG tablet Take 850 mg by mouth 3 (three) times daily. 07/07/19 08/18/19  [provider]    Social History   Socioeconomic History  . Marital status: Single    Spouse name: Not on file  . Number of children: Not on file  . Years of education: Not on file  . Highest education level: Not on file  Occupational History  . Not on file  Tobacco Use  . Smoking status: Never Smoker  . Smokeless tobacco: Never Used  Substance and Sexual Activity  . Alcohol use: Not on file  . Drug use: Not on file  . Sexual activity: Not on file  Other Topics Concern  . Not on file  Social History Narrative  . Not on file   Social Determinants of Health   Financial Resource Strain:   . Difficulty of Paying Living Expenses: Not on file  Food Insecurity:   . Worried About Programme researcher, broadcasting/film/video in the Last Year: Not on file  . Ran Out of Food in the Last  Year: Not on file  Transportation Needs:   . Lack of Transportation (Medical): Not on file  . Lack of Transportation (Non-Medical): Not on file  Physical Activity:   . Days of Exercise per Week: Not on file  . Minutes of Exercise per Session: Not on file  Stress:   . Feeling of Stress : Not on file  Social Connections:   . Frequency of Communication with Friends and Family: Not on file  . Frequency of Social Gatherings with Friends and Family: Not on file  . Attends Religious Services: Not on file  . Active Member of Clubs or Organizations: Not on file  . Attends Banker Meetings: Not on file  . Marital Status: Not on file  Intimate  Partner Violence:   . Fear of Current or Ex-Partner: Not on file  . Emotionally Abused: Not on file  . Physically Abused: Not on file  . Sexually Abused: Not on file   Review of Systems  Constitutional: Negative for chills and fever.  Respiratory: Negative for cough and shortness of breath.   Cardiovascular: Negative for chest pain.  Gastrointestinal: Negative for abdominal pain, constipation, nausea and vomiting.  Neurological: Negative for dizziness and headaches.     Physical Examination  Vitals:   08/30/19 1131 08/30/19 1208  BP:  131/72  Pulse: 91 (!) 123  Resp: 16 20  Temp:  97.6 F (36.4 C)  SpO2: 100% 95%   Body mass index is 19.4 kg/m.  General:  Thin, somewhat in distress and uncooperative trying to get out of bed Gait: Not observed HENT: WNL, normocephalic  Pulmonary: normal non-labored breathing, diminished at bases Cardiac: irregular, without  Murmurswithout carotid bruits Abdomen:  soft, NT/ND, normal bowel sounds Skin: without rashes Vascular Exam/Pulses: 2+ right radial pulse, ulnar pulse not palpable, 2+ right brachial pulse, right hand warm, normal grip strength, no ischemic changes; left radial pulse not palpable, ulnar pulse not palpable, 1+ left brachial pulse, left hand warm, some swelling of left thenar eminence, left thumb dry gangrenous changes, non tender, motor intact, sensory intact, left hand warm. Doppler ulnar, no doppler radial artery or palmar arch    Extremities: 2+ femoral pulses, 2+ DP/PT pulses bilaterally, without ischemic changes, without Gangrene , without cellulitis; without open wounds; bilateral lower extremity 2+ pitting edema  Musculoskeletal: no muscle wasting or atrophy  Neurologic: No focal weakness or paresthesias are detected; speech is fluent/normal, some what confused Psychiatric:  The pt has Normal affect. Lymph:  Unremarkable  CBC    Component Value Date/Time   WBC 17.7 (H) 08/30/2019 0929   RBC 3.19 (L)  08/30/2019 0929   HGB 10.1 (L) 08/30/2019 0929   HCT 31.1 (L) 08/30/2019 0929   PLT 312 08/30/2019 0929   MCV 97.5 08/30/2019 0929   MCH 31.7 08/30/2019 0929   MCHC 32.5 08/30/2019 0929   RDW 14.3 08/30/2019 0929   LYMPHSABS 0.7 08/30/2019 0929   MONOABS 1.2 (H) 08/30/2019 0929   EOSABS 0.0 08/30/2019 0929   BASOSABS 0.0 08/30/2019 0929    BMET    Component Value Date/Time   NA 153 (H) 08/30/2019 0929   K 3.3 (L) 08/30/2019 0929   CL 109 08/30/2019 0929   CO2 33 (H) 08/30/2019 0929   GLUCOSE 100 (H) 08/30/2019 0929   BUN 79 (H) 08/30/2019 0929   CREATININE 2.57 (H) 08/30/2019 0929   CALCIUM 8.5 (L) 08/30/2019 0929   GFRNONAA 25 (L) 08/30/2019 0929   GFRAA 29 (L) 08/30/2019 1275  COAGS: Lab Results  Component Value Date   INR 1.0 08/26/2019   INR 1.2 08/24/2019   INR 2.1 (H) 2019-09-15     Non-Invasive Vascular Imaging:   Echo 08/17/19 : 1. Small left ventricular internal cavity size. Suboptimal image quality  for the assessment of regional wal motion abnormalities.  2. Left ventricular ejection fraction, by visual estimation, is 55%. The  left ventricle has normal function. There is mildly increased left  ventricular hypertrophy.  3. Left ventricular diastolic parameters are consistent with Grade II  diastolic dysfunction (pseudonormalization).  4. Global right ventricle was not well visualized.The right ventricular  size is not well visualized. Right vetricular wall thickness was not  assessed.   Statin:  No. Beta Blocker:  No. Aspirin:  No. ACEI:  No. ARB:  No. CCB use:  Yes Other antiplatelets/anticoagulants:  No   ASSESSMENT/PLAN: This is a 65 y.o. male s/p PEA with cardiac arrest, undifferentiated shock and sepsis with new onset atrial fibrillation and pneumonia. It was noted today that he had ischemic changes of his left thumb and hand. He is right handed. Clinically he has diminished arterial flow into the left hand with dry gangrene of his left  thumb. Likely he has embolized to his radial artery secondary to pressors or atrial fibrillation. Will obtain arterial duplex of the left upper extremity. His thumb is not salvageable but may need further intervention for possible healing. Will discuss patient with on call doctor who will see the patient later today  Karoline Caldwell PA-C [facility] [phone] 08/30/2019  1:19 PM

## 2019-08-30 NOTE — Progress Notes (Addendum)
NAME:  Philip Wells, MRN:  [id number], DOB:  November 01, 1955, LOS: 71 ADMISSION DATE:  08/15/2019, CONSULTATION DATE:  08/29/2019 REFERRING MD:  ED - [facility], CHIEF COMPLAINT:  Status post cardiac arrest.   Brief History   [name] with PEA cardiac arrest and subsequent distributive shock, presumably due to sepsis, AFwRVR.  STEMI ruled out. [name].     Past Medical History  ETOH use- wine  Significant Hospital Events   2/02 admitted with undifferentiated shock following cardiac arrest 2/04 continued pressor requirements.  Completed 36 degree TTM 2/06 more awake today.  Weaning pressor requirements. 2/07 Off pressors 2/08 extubated  2/10 Reintubated, FOB with tooth removal. Diltiazem added for AF rate control. R thora 2/12 Weaning on 10/5 2/13 Apneic during wean, back on full support. Minimal chest tube output overnight  2/14 transfer out of ICU 2/15 small increase in left PTX, chest tube placed back to suction.  R thora by IR   Consults:  Cardiology - Patient R/O for STEMI  Procedures:  ETT 2/2 >> 2/8, 2/10 > 2/13 Foley 2/2 >>  L [place] TLC 2/5 >>  L CT 2/2 >>  L CT #2 2/3 >> L Radial Aline 2/2 out 08/29/2019 right thoracentesis per interventional radiology with 200 cc fluid obtained Significant Diagnostic Tests:  CT Head 2/2 >> severe atrophy but no hemorrhage  Renal US >> increased echogenicity consistent with medical renal disease ECHO 2/3 >> normal EF, Grade II diastolic dysfunction  EEG 2/4 >> diffuse encephalopathy but no seizures   Micro Data:  COVID 2/2 >> negatve Flu A/B 2/2 >> negative  BCx2 2/3 >> NG Tracheal Aspirate 2/2 >> moderate serratia marcescens, S-ceftriaxone, cefepime BAL 2/10 >> GS with rare Gram - rods, 10,000 Candida Albicans  R Pleural Fluid 2/10 > ngtd   Antimicrobials:  Vanco 2/2 >> 2/5  Cefepime 2/2/ >> 2/9  Vanc 2/12> 2/14 Zosyn 2/12> 2/14  Interim history/subjective:  No acute distress.  Chest tubes  remain in place.  Status post right thoracentesis 08/29/2019 with 200 cc daily.  continue to monitor.  Objective   Blood pressure 118/76, pulse 91, temperature 98.6 F (37 C), temperature source Oral, resp. rate (!) 22, height _0  (1.803 m), weight 63.1 kg, SpO2 97 %.        Intake/Output Summary (Last 24 hours) at 08/30/2019 1050 Last data filed at 08/30/2019 0847 Gross per 24 hour  Intake 290 ml  Output 4405 ml  Net -4115 ml   Filed Weights   08/27/19 0400 08/28/19 0500 08/29/19 0326  Weight: 70.7 kg 70.1 kg 63.1 kg    Examination:  General: 64 year old male who is somewhat uncooperative HEENT: No JVD or lymphadenopathy is appreciated Neuro: Grossly intact although uncooperative CV: Heart sounds are regular PULM: 2 left chest tubes #2 with hydro-pneumothorax #1 to waterseal without air leak GI: soft, bsx4 active   Extremities: warm/dry,  edema, left thumb necrotic Skin: no rashes or lesions   Resolved Hospital Problem list   Cardiac arrest  Shock - suspect septic  Hypoglycemia   Assessment & Plan:   Acute Hypoxemic Respiratory Failure requiring re-intubation - Failed extubation 2/8, aspirated 2/9 with swallow evaluation > worsening O2 needs after requiring re-intubation 2/10.   Had FOB 2/10 for tooth extraction.  Currently on room air   Left Hydropneumothorax - slight increase in PTX 2/15. - Place chest tube #2 back to 20cm suction now then repeat CXR this PM and in AM  2/16 (#2 had small air leak this 2/15 but on 2/16 no air leak). Continue #2 to 20 cm suction Serial chest x-ray  Right Pleural Effusion s/p thoracentesis 2/10 (NGTD). Repeat right pleural thoracentesis with 200 cc removed by interventional radiology Continue to monitor x-ray intermittently  S/p Cardiac Arrest - No clear cause.  Doubt PE with relatively normal TTE. Initially had shock requiring vasopressors.   Continue to monitor    Sepsis secondary to Serratia PNA - s/p course cefepime (end date  2/9).  Had worsening leukocytosis following cessation of cefepime; however, patient is also on decadron (65m q6hr started 2/10). BAL 2/10 with GS revealing rare Gram - rods, candida (likely colonization)  PCT 6.04, but in setting of impaired renal function this is not a great marker Continue to wean steroids Continue to monitor for infectious processes    Rest per primary team.   SRichardson LandryMinor ACNP Acute Care Nurse Practitioner LCrown CityPlease consult Amion 08/30/2019, 10:50 AM   PCCM attending:  64year old gentleman admitted status post PEA cardiac arrest.  Left-sided pneumothorax persistent air leak hydropneumothorax.  Chest is remain in place.  Pulmonary consulted for management of chest tube management.  Patient very agitated this afternoon.  States he wants to get out of the hospital.  He has been able to be reoriented per nursing staff.  BP 131/72 (BP Location: Right Arm)   Pulse (!) 123   Temp 97.6 F (36.4 C) (Oral)   Resp 20   Ht _0  (1.803 m)   Wt 63.1 kg   SpO2 95%   BMI 19.40 kg/m   General: Elderly male resting in bed, thin HEENT: NCAT, trachea midline, tracking appropriately Heart: Regular rhythm, S1-S2 Lungs: Clear to auscultation bilaterally, chest tubes in the left chest  Labs reviewed Chest x-ray: Left-sided chest tubes in position, small pneumothorax This pneumothorax is stable after reviewing multiple chest images with past couple of days chest tube #2 has been on waterseal.  Assessment: Acute hypoxic respiratory failure now on room air Left-sided hydropneumothorax Persistent left-sided pneumothorax despite chest tube placement. Right-sided transudative pleural effusion -Pleural fluid does have a elevated LDH, this is a pseudoexudate, the albumin gradient of 0.9 which is less than 1.2 consistent with transudate of effusion  Plan: No additional need for evaluation of right-sided effusion Maintain euvolemia Continue to  follow serum creatinine as it does appear to be improving. Put both chest tubes to waterseal If chest x-ray stable in the morning and consider removal of chest tubes even though there is a persistent apical pneumo which may take some significant amount of time to either resolve or may be permanent.  As a section of small loculated air.  BGarner Nash DO LFlorencePulmonary Critical Care 08/30/2019 4:39 PM

## 2019-08-30 NOTE — Progress Notes (Signed)
PROGRESS NOTE    Philip Wells  WUJ:[id number] DOB: [birth year] DOA: 09/06/2019 PCP: Patient, No Pcp Per   Brief Narrative:  The patient is a 64 year old male with a PEA cardiac arrest and subsequent distributive shock presumably due to sepsis and A. fib with RVR.  He had a STEMI ruled out and he was also found to have aspiration pneumonia.  On 09/08/2019 he was admitted with underpenetrated shock following his cardiac arrest and ended up completing his targeted temperature management on 08/18/2019 and continued pressor requirement.  Pressors were weaning and then off pressors on 08/20/2018.  On 2/8 he was extubated but then on 2/10 he was reintubated with FOB tooth removal.  He still had atrial fibrillation so diltiazem was added.  On 2/13 he was back on full Vent support.  He was extubated 08/27/19 he was transferred to the medical floor on 08/28/2019 and transferred to [facility] service on 08/28/2018 he was transferred from 6 E. to 4 E.    Today 08/29/2018 when he started become agitated and started having some behavioral disturbances so lorazepam was started.  He attempted to get out of bed several times and was being belligerent to the staff.  We will try and avoid Haldol given his recent cardiac arrest but will need to continue to monitor his QTC.  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  Likely this could have been from his arterial line and secondary to pressors versus emboli showering but is unclear how he had this.  Assessment & Plan:   Active Problems:   Cardiac arrest ([place])   Hydropneumothorax   Pneumothorax   AKI (acute kidney injury) ([place])   Encounter for chest tube placement   History of placement  of chest tube   Septic shock (HCC)   Respiratory failure (HCC)   Malnutrition of moderate degree   Pressure injury of skin  S/p Cardiac Arrest  -Unclear of his cause -Unlikely it was a PE with relatively normal TTE.  -Initially had shock requiring vasopressors and had an art line.   -continued ICU monitoring and supportive care  but now has been moved to progressive care to 4 E as he is improved now for PCCM service    Sepsis secondary to Serratia PNA  -completed course cefepime, end date 2/9 -sepsis physiology is improving -WBC is trending down and for last 3 days it went from 24.5 and is now 17.7 -Continue to monitor temperature curve and WBC  Leukocytosis in the setting of his aspiration and steroid demargination -worsening following cessation of cefepime, with SBC 28.7 on 2/11.  -of note, patient is also on decadron (29m q6hr started 2/10) BAL 2/10 with GS revealing rare Gram - rods, candida (likely colonization)  PCT 6.04, but in setting of impaired renal function this is not a great marker P -Weaning steroids to [id number] on 2/14. Plan to continue weaning -Patient's WBC is now down to 17,700 but currently he is on dexamethasone 4 mg every 12h I will defer to PCCM to further wean -No clear infectious source and CXR does not look overtly concerning for focal PNAs.Abx 2/14 discontinued. Close trending of fever curve WBC. Can re-initiate if needed  -Repeat chest x-ray  this AM showed "Two left chest tubes in stable position. Left subclavian line in stable position. Tiny left apical pneumothorax again noted. Interim improvement from prior exam. The trachea is slightly shifted to the right. A left paratracheal mass cannot be excluded. An upright PA and lateral chest x-ray suggested for further evaluation.  Patchy bilateral pulmonary infiltrates unchanged from prior exam. Small bilateral pleural effusions again noted."  Acute Hypoxemic Respiratory Failure requiring re-intubation status  post 2 chest tubes, improving  -Failed extubation 2/8, aspirated 2/9 with swallow evaluation > worsening O2 needs after, re-intubated 2/10.   FOB 2/10 for tooth extraction.  -Extubated 2/13 at night -Atelectasis on CXR as well as L apical pneumothorax and R pleural effusion  -Repeated his Thoracentesis yesterday and he had 200 mL out -Continue with IS -Supplemental O2 for SpO2 > 92%  -SpO2: (!) 80 % O2 Flow Rate (L/min): 2 L/min FiO2 (%): 40 % -SLP efforts post extubation  -Currently on dexamethasone -PT/OT, bed in chair  -PT OT recommending CIR -Pulmonary is following for his chest tubes  Right Pleural Effusion s/p thoracentesis  -CXR 2/14 with increasing effusion -prior pleural fluid cx with no growth to date  -continue diuresis with 100 mg every 12 -We will repeat his thoracentesis today -AM CXR as above -Appreciate pulmonary evaluation   Left Hydropneumothorax  -CXR with improvement in hydropneumothorax but residual apical ptx which worsened today -Chest x-ray this morning showed worsening of his pneumothorax -2/14 CT 1: no air leak CT 2: air leak -CT care per protocol  and pulmonary is managing -Leave to suction. When no air leak, water seal -Repeat the chest x-ray as above and repeat chest x-ray in a.m.  Acute metabolic encephalopathy with behavioral disturbances -Ischemic event with cardiac arrest superimposed on cerebral atrophy.  Neurological recovery is guarded given baseline cerebral atrophy. -Possible had ICU delirium but now had behavioral disturbances and was very agitated and try to climb out of bed -Delirium precautions  -early PT efforts  -keppra 500 mg PT BID  -Continue to redirect -Ordered a one-to-one sitter however no staff available till because he was becoming more more belligerent and was given IV lorazepam as-continue to monitor  AKI, slowly improving  Hypokalemia -Continue lasix 100 mg twice daily for now for diuresis efforts -Replacing  potassium again with 40 mg twice daily as his potassium was 3.3 this morning -Antibiotics have now stopped -Patient's BUN/creatinine is trending down and went from 104/3.53 and today 79/2.57 -Avoid further nephrotoxic medications, contrast dyes, hypotension and renally adjust medications -Continue to monitor and trend renal function -Repeat CMP in the a.m.  Hypomagnesemia -Patient's magnesium level was 1.2 -Replete with IV mag sulfate 3 g -Continue to monitor and replete as necessary -Repeat magnesium level in a.m.  Atrial Fibrillation, rate controlled at present -continue Amiodarone -Mag goal 2 K goal 4; potassium 3.3 this morning and magnesium was 1.2 -Continue with telemetry monitoring  Mild Transaminitis  -Suspect multifactorial in setting of baseline ETOH use, shock from cardiac arrest  -follow LFT's and they are trending downwards as AST is now 61 and ALT is 74 -Continue to monitor and trend hepatic function panel next-repeat CBC in a.m.  Normocytic Anemia, stable  -trend CBC. No transfusion indication at present -Patient's hemoglobin/hematocrit stable at 10.1/31.1 today -Check anemia panel in a.m. -Continue to monitor for signs and symptoms of bleeding; no overt bleeding noted  Nutrition -SLP evaluated and placed the patient on dysphagia 3 diet with nectar thick liquids -Continue to monitor  for advancement -May need D5W free water given his hypernatremia but will hold off given that he is currently being diuresed  Hypernatremia, worsening -Mild as patient's sodium went from 144 -> 147 -> 150 -> 153 -In the setting of IV Lasix -We will not give him IV fluid D5 given that he is being diuresed -Encourage oral feeding and may need to add just free water to his diet -Repeat CMP  Moderate Malnutrition in the Context of Chronic Illness -Dietitian consulted for further evaluation and Recc's -C/w Magic cup TID with meals and Vital Cuisine shakes TID  Dry gangrene of the  left thumb -Does not have any radial pulse on the left -Consulted orthopedic hand surgery -Orthopedic surgery evaluated and felt vascular consult was needed for the consult of vascular surgery -Vascular surgery obtaining an arterial duplex as his kidney function is too high to do an arteriogram -Likely will need an amputation and revision  DVT prophylaxis: Subcu heparin Code Status: Patient is a partial code and no CPR should be initiated now defibrillator and no ACLS medications; intubation and PPV is okay Family Communication: No family present at bedside Disposition Plan: Had a PE arrest and is currently still being medically worked up and has Bilateral Chest Tubes; CIR to be consulted; patient was on 6 E. and will be transferred down to 4 E for specialized nursing care that deals with chest tubes.  Consultants:   PCCM Transfer/Pulmonary  Cardiology will rule the patient out for STEMI  Palliative care  Orthopedic hand surgery  Vascular surgery  Procedures:  ETT 2/2 >> 2/8, 2/10 > 2/13 Foley 2/2 >>  L [place] TLC 2/5 >>  L CT 2/2 >>  L CT #2 2/3 >> L Radial Aline 2/2   Significant Diagnostic Tests:   CT Head 2/2 >> severe atrophy but no hemorrhage   Renal US >> increased echogenicity consistent with medical renal disease  ECHO 2/3 >> normal EF, Grade II diastolic dysfunction   EEG 2/4 >> diffuse encephalopathy but no seizures  R Thoracentesis 2/10 >>  Repeat right thoracentesis on 08/29/2019    Micro Data:  COVID 2/2 >> negatve Flu A/B 2/2 >> negative  BCx2 2/3 >> NG Tracheal Aspirate 2/2 >> moderate serratia marcescens, S-ceftriaxone, cefepime BAL 2/10 >> GS with rare Gram - rods, 10,000 Candida Albicans  R Pleural Fluid 2/10 > ngtd     Antimicrobials:  Anti-infectives (From admission, onward)   Start     Dose/Rate Route Frequency Ordered Stop   08/26/19 1400  vancomycin (VANCOREADY) IVPB 1500 mg/300 mL     1,500 mg 150 mL/hr over 120 Minutes Intravenous   Once 08/26/19 1145 08/26/19 1623   08/26/19 1230  piperacillin-tazobactam (ZOSYN) IVPB 2.25 g  Status:  Discontinued     2.25 g 100 mL/hr over 30 Minutes Intravenous Every 8 hours 08/26/19 1145 08/28/19 1433   08/26/19 1145  vancomycin variable dose per unstable renal function (pharmacist dosing)  Status:  Discontinued      Does not apply See admin instructions 08/26/19 1145 08/28/19 1433   08/23/19 1000  ceFEPIme (MAXIPIME) 2 g in sodium chloride 0.9 % 100 mL IVPB     2 g 200 mL/hr over 30 Minutes Intravenous Every 24 hours 08/22/19 1437 08/23/19 1250   08/20/19 1000  ceFEPIme (MAXIPIME) 2 g in sodium chloride 0.9 % 100 mL IVPB  Status:  Discontinued     2 g 200 mL/hr over 30 Minutes Intravenous Every 12 hours 08/20/19 0914 08/22/19  1437   08/19/19 0930  vancomycin (VANCOREADY) IVPB 1250 mg/250 mL     1,250 mg 166.7 mL/hr over 90 Minutes Intravenous  Once 08/19/19 0904 08/19/19 1154   08/18/19 0045  vancomycin (VANCOREADY) IVPB 1250 mg/250 mL     1,250 mg 166.7 mL/hr over 90 Minutes Intravenous  Once 08/18/19 0031 08/18/19 0258   08/24/2019 2115  ceFEPIme (MAXIPIME) 2 g in sodium chloride 0.9 % 100 mL IVPB  Status:  Discontinued     2 g 200 mL/hr over 30 Minutes Intravenous Every 24 hours 08/18/2019 2114 08/20/19 0914   08/17/2019 2115  vancomycin (VANCOCIN) IVPB 1000 mg/200 mL premix     1,000 mg 200 mL/hr over 60 Minutes Intravenous  Once 09/08/2019 2114 08/17/19 0031   08/22/2019 2114  vancomycin variable dose per unstable renal function (pharmacist dosing)  Status:  Discontinued      Does not apply See admin instructions 09/01/2019 2114 08/20/19 0827     Subjective: Seen and examined at bedside and he was having some agitation issues and was given 1 word responses.  Subjective history is very difficult to obtain for the patient and nursing states that he is more belligerent and agitated.  Appears to be little bit more encephalopathic than when I saw him yesterday and when I introduced myself  again to him as his medical doctor he stated "I am my own doctor."  He states that he has been coughing but unable to cough up sputum.  No other concerns or complaints at this time and remained agitated throughout the entire encounter.    Objective: Vitals:   08/30/19 1208 08/30/19 1642 08/30/19 1947 08/30/19 1953  BP: 131/72 107/80 123/81   Pulse: (!) 123 (!) 101 93 (!) 131  Resp: 20 20 (!) 24 (!) 24  Temp: 97.6 F (36.4 C) 98.4 F (36.9 C)    TempSrc: Oral     SpO2: 95% 100% 96% (!) 80%  Weight:      Height:        Intake/Output Summary (Last 24 hours) at 08/30/2019 2103 Last data filed at 08/30/2019 1651 Gross per 24 hour  Intake 100 ml  Output 3321 ml  Net -3221 ml   Filed Weights   08/27/19 0400 08/28/19 0500 08/29/19 0326  Weight: 70.7 kg 70.1 kg 63.1 kg   Examination: Physical Exam:  Constitutional: WN thin African-American male currently who appears agitated and does look a little uncomfortable Eyes: Lids and conjunctivae normal, sclerae anicteric  ENMT: External Ears, Nose appear normal. Grossly normal hearing.  Neck: Appears normal, supple, no cervical masses, normal ROM, no appreciable thyromegaly; no appreciable JVD Respiratory: Diminished to auscultation bilaterally with coarse breath sounds and some mild rhonchi and slight crackles., no wheezing, rales, rhonchi or crackles.  Has a normal respiratory effort is wearing supplemental oxygen via nasal cannula he has 2 chest tubes in place Cardiovascular: Irregularly irregular and mildly tachycardic, has 1+ lower extremity edema still Abdomen: Soft, non-tender, non-distended. Bowel sounds positive.  GU: Deferred. Musculoskeletal: No contractures noted..  Skin: Has a left gangrenous foam which is dry gangrene and has some pressure injuries on his face. Neurologic: CN 2-12 grossly intact with no focal deficits but does not really want to cooperate with his motor examination. Romberg sig and cerebellar reflexes not  assessed.  Psychiatric: Impaired judgment and insight.  Somewhat agitated mood and appropriate affect.   Data Reviewed: I have personally reviewed following labs and imaging studies  CBC: Recent Labs  Lab 08/25/19  0401 08/25/19 0401 08/26/19 0341 08/27/19 0455 08/28/19 0507 08/29/19 0348 08/30/19 0929  WBC 23.0*   < > 28.7* 22.4* 24.5* 20.6* 17.7*  NEUTROABS 22.0*  --  27.0* 21.0* 22.8*  --  15.7*  HGB 10.3*   < > 10.1* 10.0* 10.7* 10.9* 10.1*  HCT 29.7*   < > 29.9* 28.7* 32.0* 33.1* 31.1*  MCV 90.8   < > 93.7 90.5 93.8 95.1 97.5  PLT 154   < > 197 243 286 314 312   < > = values in this interval not displayed.   Basic Metabolic Panel: Recent Labs  Lab 08/24/19 0326 08/24/19 0341 08/25/19 2211 08/26/19 0341 08/27/19 0455 08/28/19 0507 08/29/19 0348 08/29/19 1650 08/30/19 0929  NA 144   < > 144   < > 144 147* 149* 150* 153*  K 3.5   < > 3.5   < > 3.2* 3.0* 3.3* 3.2* 3.3*  CL 103   < > 102   < > 100 103 104 104 109  CO2 30   < > 30   < > 32 32 32 33* 33*  GLUCOSE 151*   < > 174*   < > 143* 138* 108* 102* 100*  BUN 83*   < > 103*   < > 111* 104* 90* 82* 79*  CREATININE 3.92*   < > 4.24*   < > 4.05* 3.53* 3.01* 2.74* 2.57*  CALCIUM 8.2*   < > 8.4*   < > 8.4* 8.7* 8.5* 8.5* 8.5*  MG 1.8  --  1.7  --  1.6*  --  1.4*  --  1.2*  PHOS  --   --   --   --   --   --   --   --  4.2   < > = values in this interval not displayed.   GFR: Estimated Creatinine Clearance: 25.9 mL/min (A) (by C-G formula based on SCr of 2.57 mg/dL (H)). Liver Function Tests: Recent Labs  Lab 08/27/19 0455 08/28/19 0507 08/29/19 0348 08/29/19 1650 08/30/19 0929  AST 96* 129* 92* 77* 61*  ALT 83* 112* 96* 88* 74*  ALKPHOS 126 135* 112 103 102  BILITOT 0.7 0.9 0.8 1.0 1.1  PROT 5.0* 5.3* 5.1* 5.1* 5.4*  ALBUMIN 1.4* 1.5* 1.5* 1.6* 1.6*   No results for input(s): LIPASE, AMYLASE in the last 168 hours. No results for input(s): AMMONIA in the last 168 hours. Coagulation Profile: Recent Labs    Lab 08/24/19 0326 08/26/19 1005  INR 1.2 1.0   Cardiac Enzymes: No results for input(s): CKTOTAL, CKMB, CKMBINDEX, TROPONINI in the last 168 hours. BNP (last 3 results) No results for input(s): PROBNP in the last 8760 hours. HbA1C: No results for input(s): HGBA1C in the last 72 hours. CBG: Recent Labs  Lab 08/30/19 0415 08/30/19 0827 08/30/19 1203 08/30/19 1639 08/30/19 1951  GLUCAP 116* 102* 91 165* 108*   Lipid Profile: No results for input(s): CHOL, HDL, LDLCALC, TRIG, CHOLHDL, LDLDIRECT in the last 72 hours. Thyroid Function Tests: No results for input(s): TSH, T4TOTAL, FREET4, T3FREE, THYROIDAB in the last 72 hours. Anemia Panel: No results for input(s): VITAMINB12, FOLATE, FERRITIN, TIBC, IRON, RETICCTPCT in the last 72 hours. Sepsis Labs: Recent Labs  Lab 08/26/19 1005  PROCALCITON 6.04    Recent Results (from the past 240 hour(s))  Body fluid culture (includes gram stain)     Status: None   Collection Time: 08/24/19  9:40 AM   Specimen: Pleural Fluid  Result  Value Ref Range Status   Specimen Description PLEURAL RIGHT  Final   Special Requests NONE  Final   Gram Stain   Final    RARE WBC PRESENT, PREDOMINANTLY MONONUCLEAR NO ORGANISMS SEEN    Culture   Final    NO GROWTH 3 DAYS Performed at [facility], [address] [place], [place] [address]    Report Status 08/27/2019 FINAL  Final  Culture, bal-quantitative     Status: Abnormal   Collection Time: 08/24/19 10:39 AM   Specimen: Bronchoalveolar Lavage; Respiratory  Result Value Ref Range Status   Specimen Description BRONCHIAL ALVEOLAR LAVAGE RIGHT LOWER LUNG  Final   Special Requests NONE  Final   Gram Stain   Final    RARE WBC PRESENT,BOTH PMN AND MONONUCLEAR RARE GRAM NEGATIVE RODS Performed at [facility], [place] [address], [place], [place] [address]    Culture 10,000 COLONIES/mL CANDIDA ALBICANS (A)  Final   Report Status 08/26/2019 FINAL  Final  Gram stain     Status: None    Collection Time: 08/29/19 11:47 AM   Specimen: Lung, Right; Pleural Fluid  Result Value Ref Range Status   Specimen Description PLEURAL RIGHT  Final   Special Requests NONE  Final   Gram Stain   Final    RARE WBC PRESENT,BOTH PMN AND MONONUCLEAR NO ORGANISMS SEEN Performed at [facility], [place] [address], [place], [place] [address]    Report Status 08/29/2019 FINAL  Final  Culture, body fluid-bottle     Status: None (Preliminary result)   Collection Time: 08/29/19 11:47 AM   Specimen: Pleura  Result Value Ref Range Status   Specimen Description PLEURAL RIGHT  Final   Special Requests NONE  Final   Culture   Final    NO GROWTH < 24 HOURS Performed at [facility], [place] [address] [place], [place] [address]    Report Status PENDING  Incomplete     RN Pressure Injury Documentation: Pressure Injury 08/23/19 Head Left Stage 1 -  Intact skin with non-blanchable redness of a localized area usually over a bony prominence. Small Pressure Injury from Forehead Probe (Active)  08/23/19 0800  Location: Head  Location Orientation: Left  Staging: Stage 1 -  Intact skin with non-blanchable redness of a localized area usually over a bony prominence.  Wound Description (Comments): Small Pressure Injury from Forehead Probe  Present on Admission: No     Pressure Injury 08/24/19 Lip Upper Stage 2 -  Partial thickness loss of dermis presenting as a shallow open injury with a red, pink wound bed without slough. pink with white/yellow center in middle (Active)  08/24/19 2000  Location: Lip  Location Orientation: Upper  Staging: Stage 2 -  Partial thickness loss of dermis presenting as a shallow open injury with a red, pink wound bed without slough.  Wound Description (Comments): pink with white/yellow center in middle  Present on Admission: No     Pressure Injury 08/27/19 Face Left Stage 3 -  Full thickness tissue loss. Subcutaneous fat may be visible but bone, tendon or muscle are NOT  exposed. (Active)  08/27/19 1600  Location: Face  Location Orientation: Left  Staging: Stage 3 -  Full thickness tissue loss. Subcutaneous fat may be visible but bone, tendon or muscle are NOT exposed.  Wound Description (Comments):   Present on Admission:      Estimated body mass index is 19.4 kg/m as calculated from the following:  Height as of this encounter: _0  (1.803 m).   Weight as of this encounter: 63.1 kg.  Malnutrition Type:  Nutrition Problem: Moderate Malnutrition Etiology: chronic illness, social / environmental circumstances   Malnutrition Characteristics:  Signs/Symptoms: mild fat depletion, moderate muscle depletion   Nutrition Interventions:  Interventions: Magic cup, Hormel Shake   Radiology Studies: DG Chest 1 View  Result Date: 08/29/2019 CLINICAL DATA:  Thoracentesis today EXAM: CHEST  1 VIEW COMPARISON:  Yesterday FINDINGS: Decrease in right pleural effusion. Two left-sided chest tubes with unchanged small to moderate pneumothorax. Unchanged interstitial opacities. Normal heart size. Left subclavian line with tip at the upper SVC. IMPRESSION: 1. No right-sided pneumothorax after thoracentesis. 2. Unchanged left chest tube positioning and pneumothorax. Electronically Signed   By: Monte Fantasia M.D.   On: 08/29/2019 11:33   DG Chest Port 1 View  Result Date: 08/30/2019 CLINICAL DATA:  Pneumothorax.  Chest tubes. EXAM: PORTABLE CHEST 1 VIEW COMPARISON:  08/29/2019 08/28/2019.  08/23/2019.  09/10/2019. FINDINGS: Two left chest tubes in stable position. Left subclavian line in stable position. Tiny left apical pneumothorax, improved from prior exam. Trachea is slightly shifted to the right. A left paratracheal mass cannot be completely excluded. An upright PA and lateral chest x-ray suggested for further evaluation. Unchanged right upper lung and bibasilar infiltrates. Small bilateral pleural effusions again noted. Right-sided pleural thickening again noted  without interim change. Oral contrast in the colon. Degenerative change thoracic spine. IMPRESSION: 1. Two left chest tubes in stable position. Left subclavian line in stable position. Tiny left apical pneumothorax again noted. Interim improvement from prior exam. 2. The trachea is slightly shifted to the right. A left paratracheal mass cannot be excluded. An upright PA and lateral chest x-ray suggested for further evaluation. 3. Patchy bilateral pulmonary infiltrates unchanged from prior exam. Small bilateral pleural effusions again noted. Electronically Signed   By: Marcello Moores  Register   On: 08/30/2019 08:40   DG Chest Port 1 View  Result Date: 08/29/2019 CLINICAL DATA:  Chest tube positions, evaluate for pneumothorax EXAM: PORTABLE CHEST 1 VIEW COMPARISON:  08/29/2019 at 1122 hours FINDINGS: Two indwelling left chest tubes. Small left apical pneumothorax, unchanged. Small right pleural effusion and small left basilar pleural fluid component. Stable right upper lobe and bibasilar opacities. Mild cardiomegaly. Left subclavian venous catheter terminates in the mid SVC. IMPRESSION: Two indwelling left chest tubes. Small left apical pneumothorax, unchanged. Additional stable findings as above. Electronically Signed   By: Julian Hy M.D.   On: 08/29/2019 19:30   DG CHEST PORT 1 VIEW  Result Date: 08/29/2019 CLINICAL DATA:  Pneumothorax with chest tubes in place EXAM: PORTABLE CHEST 1 VIEW COMPARISON:  August 28, 2019 FINDINGS: Chest tubes are again noted on the left. There is been increase in pneumothorax along the left lateral and left basilar regions with stable apical pneumothorax on the left. No appreciable tension component. Central catheter tip is in the superior vena cava. Feeding tube no longer evident. There is a right pleural effusion which may be slightly larger. There is bibasilar atelectasis as well as atelectasis in the right mid lung. Heart is upper normal in size with pulmonary vascularity  normal. No adenopathy. No bone lesions. IMPRESSION: Stable chest tubes on the left. There has been increase in the left pneumothorax along its lateral and basilar regions with stable small pneumothorax in the left apex. No tension component. Stable right pleural effusion. Areas of atelectatic change bilaterally, more on the right than the left, remains stable.  Stable cardiac silhouette. These results will be called to the ordering clinician or representative by the Radiologist Assistant, and communication documented in the PACS or zVision Dashboard. Electronically Signed   By: Lowella Grip III M.D.   On: 08/29/2019 08:06   VAS [place] UPPER EXTREMITY ARTERIAL DUPLEX  Result Date: 08/30/2019 UPPER EXTREMITY DUPLEX STUDY Indications: Patient complains of Ischemic thumb.  Comparison Study: No prior studies. Performing Technologist: Oliver Hum RVT  Examination Guidelines: A complete evaluation includes B-mode imaging, spectral Doppler, color Doppler, and power Doppler as needed of all accessible portions of each vessel. Bilateral testing is considered an integral part of a complete examination. Limited examinations for reoccurring indications may be performed as noted.  Left Doppler Findings: +---------------+----------+--------+--------+----------------------------+ Site           PSV (cm/s)WaveformStenosisComments                     +---------------+----------+--------+--------+----------------------------+ Subclavian Prox73                                                     +---------------+----------+--------+--------+----------------------------+ Axillary       97.00                                                  +---------------+----------+--------+--------+----------------------------+ Brachial Prox  74                                                     +---------------+----------+--------+--------+----------------------------+ Brachial Dist  117                                                     +---------------+----------+--------+--------+----------------------------+ Radial Prox    135                                                    +---------------+----------+--------+--------+----------------------------+ Radial Mid     57                                                     +---------------+----------+--------+--------+----------------------------+ Radial Dist    14                        Occlusion noted in the wrist +---------------+----------+--------+--------+----------------------------+ Ulnar Prox     116                                                    +---------------+----------+--------+--------+----------------------------+ Ulnar  Mid      81                                                     +---------------+----------+--------+--------+----------------------------+ Ulnar Dist     93                                                     +---------------+----------+--------+--------+----------------------------+ The brachial artery bifurcates into the radial and ulnar arteries in the mid upper arm.  Summary:  Left: Obstruction noted in the distal radial artery. *See table(s) above for measurements and observations. Electronically signed by Deitra Mayo MD on 08/30/2019 at 4:11:30 PM.    Final    IR THORACENTESIS ASP PLEURAL SPACE W/IMG GUIDE  Result Date: 08/29/2019 INDICATION: Patient with PEA arrest, left hydropneumothorax status post chest placement x2. Now with right sided pleural effusion. Request is made for diagnostic and therapeutic right thoracentesis. EXAM: ULTRASOUND GUIDED RIGHT DIAGNOSTIC AND THERAPEUTIC THORACENTESIS MEDICATIONS: 10 mL 1% lidocaine COMPLICATIONS: None immediate. PROCEDURE: An ultrasound guided thoracentesis was thoroughly discussed with the patient and questions answered. The benefits, risks, alternatives and complications were also discussed. The patient understands and wishes to proceed  with the procedure. Written consent was obtained. Ultrasound was performed to localize and mark an adequate pocket of fluid in the right chest. The area was then prepped and draped in the normal sterile fashion. 1% Lidocaine was used for local anesthesia. Under ultrasound guidance a 6 Fr Safe-T-Centesis catheter was introduced. Thoracentesis was performed. The catheter was removed and a dressing applied. FINDINGS: A total of approximately 200 mL of yellow fluid was removed. Samples were sent to the laboratory as requested by the clinical team. IMPRESSION: Successful ultrasound guided diagnostic and therapeutic right thoracentesis yielding 200 mL of pleural fluid. Read by: Brynda Greathouse PA-C Electronically Signed   By: Markus Daft M.D.   On: 08/29/2019 11:49   Scheduled Meds: . amiodarone  200 mg Oral Daily  . Chlorhexidine Gluconate Cloth  6 each Topical Daily  . dexamethasone (DECADRON) injection  4 mg Intravenous Q12H  . heparin  5,000 Units Subcutaneous Q8H  . insulin aspart  0-9 Units Subcutaneous Q4H  . levETIRAcetam  500 mg Oral BID  . mouth rinse  15 mL Mouth Rinse BID  . pantoprazole  40 mg Oral Daily  . potassium chloride  40 mEq Oral BID  . sodium chloride flush  10-40 mL Intracatheter Q12H   Continuous Infusions: . sodium chloride 10 mL/hr at 08/29/19 0300  . amiodarone Stopped (08/25/19 2208)  . furosemide 100 mg (08/30/19 1652)    LOS: 14 days    Kerney Elbe, DO [facility] PAGER is on [place]  If 7PM-7AM, please contact night-coverage [email or web address]

## 2019-08-30 NOTE — Progress Notes (Signed)
RT NOTE: Patient refused CPT again at this time. RN aware. RT will continue to monitor as needed.

## 2019-08-30 NOTE — Consult Note (Signed)
Patient has been seen and evaluated by myself.  Is difficult to get any history from the patient.  Certainly he has had significant challenges since his admission in December.  At present juncture he has an ischemic left thumb with dry gangrene.  I do not feel any urgency to perform revision amputation until the area completely demarcates.  As long as there is not a nidus of infection or evolving cellulitic change suggestive of wet gangrene I would allow the process to demarcate and move forward with an amputation into the future.  I reviewed the notes from Dale [place], PA-C and agree I have also reviewed vascular surgery's notes and will follow along.  Ultimately, I feel he will need a amputation completion/revision amputation of the dry gangrene.  We will await vascular surgery's input see if in fact there is a higher lesion.  This may represent ischemia secondary to pressors versus emboli showering etc.  I do not feel any revascularization is an option for the thumb at the hand level.  The dry gangrene certainly tells [place] this.  We will follow along with you  Rovena Hearld MD

## 2019-08-30 NOTE — Progress Notes (Signed)
Left upper extremity arterial duplex has been completed. Preliminary results can be found in CV Proc through chart review.  Results were given to the patient's nurse,   08/30/19 2:21 PM Olen Cordial RVT

## 2019-08-30 NOTE — Progress Notes (Signed)
RT NOTE: RT holding CPT at this time. Patient told RT that he would like to sleep and for RT to come later. RN aware. RT will attempt at next scheduled time. RT will continue to monitor as needed.

## 2019-08-31 ENCOUNTER — Inpatient Hospital Stay (HOSPITAL_COMMUNITY): Payer: Self-pay

## 2019-08-31 DIAGNOSIS — J9312 Secondary spontaneous pneumothorax: Secondary | ICD-10-CM

## 2019-08-31 DIAGNOSIS — J9383 Other pneumothorax: Secondary | ICD-10-CM

## 2019-08-31 DIAGNOSIS — N179 Acute kidney failure, unspecified: Secondary | ICD-10-CM

## 2019-08-31 DIAGNOSIS — J9311 Primary spontaneous pneumothorax: Secondary | ICD-10-CM

## 2019-08-31 LAB — GLUCOSE, CAPILLARY
Glucose-Capillary: 118 mg/dL — ABNORMAL HIGH (ref 70–99)
Glucose-Capillary: 127 mg/dL — ABNORMAL HIGH (ref 70–99)
Glucose-Capillary: 208 mg/dL — ABNORMAL HIGH (ref 70–99)
Glucose-Capillary: 56 mg/dL — ABNORMAL LOW (ref 70–99)
Glucose-Capillary: 63 mg/dL — ABNORMAL LOW (ref 70–99)
Glucose-Capillary: 75 mg/dL (ref 70–99)
Glucose-Capillary: 76 mg/dL (ref 70–99)
Glucose-Capillary: 91 mg/dL (ref 70–99)

## 2019-08-31 LAB — CBC WITH DIFFERENTIAL/PLATELET
Abs Immature Granulocytes: 0.11 10*3/uL — ABNORMAL HIGH (ref 0.00–0.07)
Basophils Absolute: 0 10*3/uL (ref 0.0–0.1)
Basophils Relative: 0 %
Eosinophils Absolute: 0 10*3/uL (ref 0.0–0.5)
Eosinophils Relative: 0 %
HCT: 34.8 % — ABNORMAL LOW (ref 39.0–52.0)
Hemoglobin: 11 g/dL — ABNORMAL LOW (ref 13.0–17.0)
Immature Granulocytes: 1 %
Lymphocytes Relative: 2 %
Lymphs Abs: 0.4 10*3/uL — ABNORMAL LOW (ref 0.7–4.0)
MCH: 31 pg (ref 26.0–34.0)
MCHC: 31.6 g/dL (ref 30.0–36.0)
MCV: 98 fL (ref 80.0–100.0)
Monocytes Absolute: 0.8 10*3/uL (ref 0.1–1.0)
Monocytes Relative: 4 %
Neutro Abs: 16.2 10*3/uL — ABNORMAL HIGH (ref 1.7–7.7)
Neutrophils Relative %: 93 %
Platelets: 352 10*3/uL (ref 150–400)
RBC: 3.55 MIL/uL — ABNORMAL LOW (ref 4.22–5.81)
RDW: 14 % (ref 11.5–15.5)
WBC: 17.6 10*3/uL — ABNORMAL HIGH (ref 4.0–10.5)
nRBC: 0 % (ref 0.0–0.2)

## 2019-08-31 LAB — COMPREHENSIVE METABOLIC PANEL
ALT: 68 U/L — ABNORMAL HIGH (ref 0–44)
AST: 49 U/L — ABNORMAL HIGH (ref 15–41)
Albumin: 1.8 g/dL — ABNORMAL LOW (ref 3.5–5.0)
Alkaline Phosphatase: 99 U/L (ref 38–126)
Anion gap: 8 (ref 5–15)
BUN: 66 mg/dL — ABNORMAL HIGH (ref 8–23)
CO2: 34 mmol/L — ABNORMAL HIGH (ref 22–32)
Calcium: 8.8 mg/dL — ABNORMAL LOW (ref 8.9–10.3)
Chloride: 110 mmol/L (ref 98–111)
Creatinine, Ser: 2.15 mg/dL — ABNORMAL HIGH (ref 0.61–1.24)
GFR calc Af Amer: 36 mL/min — ABNORMAL LOW (ref >60)
GFR calc non Af Amer: 31 mL/min — ABNORMAL LOW (ref >60)
Glucose, Bld: 128 mg/dL — ABNORMAL HIGH (ref 70–99)
Potassium: 3.8 mmol/L (ref 3.5–5.1)
Sodium: 152 mmol/L — ABNORMAL HIGH (ref 135–145)
Total Bilirubin: 0.9 mg/dL (ref 0.3–1.2)
Total Protein: 5.8 g/dL — ABNORMAL LOW (ref 6.5–8.1)

## 2019-08-31 LAB — PH, BODY FLUID: pH, Body Fluid: 7.9

## 2019-08-31 LAB — MAGNESIUM: Magnesium: 1.8 mg/dL (ref 1.7–2.4)

## 2019-08-31 LAB — PHOSPHORUS: Phosphorus: 4.7 mg/dL — ABNORMAL HIGH (ref 2.5–4.6)

## 2019-08-31 MED ORDER — FUROSEMIDE 10 MG/ML IJ SOLN
40.0000 mg | Freq: Two times a day (BID) | INTRAMUSCULAR | Status: DC
  Administered 2019-08-31 – 2019-09-01 (×3): 40 mg via INTRAVENOUS
  Filled 2019-08-31 (×3): qty 4

## 2019-08-31 MED ORDER — DEXTROSE 5 % IV SOLN: INTRAVENOUS | Status: AC

## 2019-08-31 MED ORDER — AMIODARONE IV BOLUS ONLY 150 MG/100ML
150.0000 mg | Freq: Once | INTRAVENOUS | Status: AC
  Administered 2019-09-01: 150 mg via INTRAVENOUS
  Filled 2019-08-31: qty 100

## 2019-08-31 MED ORDER — LEVETIRACETAM 250 MG PO TABS
250.0000 mg | ORAL_TABLET | Freq: Two times a day (BID) | ORAL | Status: DC
  Filled 2019-08-31 (×2): qty 1

## 2019-08-31 MED ORDER — DEXAMETHASONE SODIUM PHOSPHATE 4 MG/ML IJ SOLN
4.0000 mg | INTRAMUSCULAR | Status: DC
  Administered 2019-09-01: 11:00:00 4 mg via INTRAVENOUS
  Filled 2019-08-31: qty 1

## 2019-08-31 MED ORDER — DEXTROSE 50 % IV SOLN
INTRAVENOUS | Status: AC
  Administered 2019-08-31: 21:00:00 50 mL
  Filled 2019-08-31: qty 50

## 2019-08-31 MED ORDER — POTASSIUM CHLORIDE 10 MEQ/100ML IV SOLN
INTRAVENOUS | Status: AC
  Filled 2019-08-31: qty 100

## 2019-08-31 MED ORDER — DEXTROSE 50 % IV SOLN
INTRAVENOUS | Status: AC
  Administered 2019-08-31: 12:00:00 50 mL
  Filled 2019-08-31: qty 50

## 2019-08-31 NOTE — Progress Notes (Signed)
   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  He has a biphasic ulnar signal and palmar arch signal with the Doppler.  We might normally consider an arteriogram but given his chronic kidney disease I think we should avoid a CT angiogram or arteriogram.   Appreciate Dr. Carlos Levering note.  I can follow along also and if his kidney function improves we could consider a formal arteriogram of the left upper extremity.  However I think it is unlikely that we would find anything that can be addressed from a vascular standpoint.   SUBJECTIVE:   Resting comfortably.  PHYSICAL EXAM:   Vitals:   08/30/19 1947 08/30/19 1953 08/30/19 2358 08/31/19 0605  BP: 123/81  126/68 113/82  Pulse: 93 (!) 131 100 90  Resp: (!) 24 (!) 24 (!) 24 20  Temp:      TempSrc:      SpO2: 96% (!) 80% 100% 100%  Weight:      Height:       No change in the dry gangrene to the left thumb.  DATA:    LEFT UPPER EXTREMITY DUPLEX: I have independently interpreted his left upper extremity duplex.  The subclavian, axillary, brachial, ulnar, and radial artery are patent.  The radial artery occludes at the wrist.  There is no evidence of significant occlusive disease with the exception of the occlusion of the radial artery at the wrist.  There is no evidence of embolic disease or significant atherosclerotic plaque.  Lab Results  Component Value Date   WBC 17.6 (H) 08/31/2019   HGB 11.0 (L) 08/31/2019   HCT 34.8 (L) 08/31/2019   MCV 98.0 08/31/2019   PLT 352 08/31/2019   Lab Results  Component Value Date   CREATININE 2.15 (H) 08/31/2019   CBG (last 3)  Recent Labs    08/30/19 1951 08/31/19 0004 08/31/19 0416  GLUCAP 108* 118* 91    PROBLEM LIST:    Active Problems:   Cardiac arrest (HCC)   Hydropneumothorax   Pneumothorax   AKI (acute kidney injury) (HCC)   Encounter for chest  tube placement   History of placement of chest tube   Septic shock (HCC)   Respiratory failure (HCC)   Malnutrition of moderate degree   Pressure injury of skin   CURRENT MEDS:   . amiodarone  200 mg Oral Daily  . Chlorhexidine Gluconate Cloth  6 each Topical Daily  . dexamethasone (DECADRON) injection  4 mg Intravenous Q12H  . heparin  5,000 Units Subcutaneous Q8H  . insulin aspart  0-9 Units Subcutaneous Q4H  . levETIRAcetam  500 mg Oral BID  . mouth rinse  15 mL Mouth Rinse BID  . pantoprazole  40 mg Oral Daily  . sodium chloride flush  10-40 mL Intracatheter Q12H    Waverly Ferrari Office: [phone] 08/31/2019

## 2019-08-31 NOTE — Progress Notes (Signed)
OT Cancellation Note  Patient Details Name: Philip Wells MRN: [id number] DOB: 02-02-1956   Cancelled Treatment:    Reason Eval/Treat Not Completed: Patient declined, no reason specified.  Patient reports fatigued and declines participation in session at this time.  Will follow and see as able.   Barry Brunner, OT Acute Rehabilitation Services Pager [phone] Office [phone]   Chancy Milroy 08/31/2019, 10:13 AM

## 2019-08-31 NOTE — Progress Notes (Signed)
Patient trying to get out of bed. Legs over side rails. Talking very loud and cursing.  R.N. aware and gave patient prn Ativan 2 mg I.V.

## 2019-08-31 NOTE — Consult Note (Addendum)
Reason for Consult: Hydropneumothorax Referring Physician: Hospitalist  Philip Wells is an 64 y.o. male.  HPI: The patient is a 64 year old male who presented with PEA cardiac arrest distributive shock on 09/06/2019 presumed due to sepsis and A. fib with RVR.  Little was known at the time that he was found down.  He did have return of spontaneous circulation after 6 rounds of CPR not requiring a shock.  His initial temperature was 33 Celsius.  He was subsequently found to have Serratia pneumonia.  He did rule out for STEMI.  He had a chest tube placed on 08/27/2019 in the left side and a second placed on 08/17/2019.  He initially required pressor support.  These were discontinued on 08/21/2019.  He was extubated on 2/ 8 but reintubated on 2/10.  He underwent a fiberoptic bronchoscopy and had a tooth removal.  Reattempt was made at weaning on 08/27/2019 and this was successful.  He has continued to require supplemental oxygen.  Chest x-ray on 215 showed some increase in his left hydropneumothorax.  T CTS is being consulted for further assistance with management and possible video-assisted thoracoscopy.  He is also noted to have a right pleural effusion and has had thoracentesis on both to 10 in 08/29/2019.  He has completed a course of cefepime white blood cell count does continue to have elevation but it is noted he has also received steroids.  History reviewed. No pertinent past medical history.  Past Surgical History:  Procedure Laterality Date  . IR THORACENTESIS ASP PLEURAL SPACE W/IMG GUIDE  08/29/2019    History reviewed. No pertinent family history.  Social History:  reports that he has never smoked. He has never used smokeless tobacco. No history on file for alcohol and drug.  Allergies: No Known Allergies  Medications: I have reviewed the patient's current medications.  Results for orders placed or performed during the hospital encounter of 08/31/2019 (from the past 48 hour(s))  ALBUMIN,  FLUID (Other)     Status: None   Collection Time: 08/29/19  4:27 PM  Result Value Ref Range   Source of Sample PLEURAL FLD     Comment: Performed at [facility], [address], [place], [place] [address] CORRECTED ON 02/15 AT 1719: PREVIOUSLY REPORTED AS PLUERAL     Albumin, Body Fluid Other 0.7 Not Estab. g/dL    Comment: (NOTE) The reference intervals and other method performance specifications have not been established for this test. The test result should be integrated into the clinical context for interpretation. The reference interval(s) and other method performance specifications have not been established for this body fluid. The test result must be integrated into the clinical context for interpretation. Performed At: [facility] [address] [place], [place] [id number] Jolene Schimke MD [id number]   Glucose, capillary     Status: None   Collection Time: 08/29/19  4:34 PM  Result Value Ref Range   Glucose-Capillary 97 70 - 99 mg/dL  Comprehensive metabolic panel     Status: Abnormal   Collection Time: 08/29/19  4:50 PM  Result Value Ref Range   Sodium 150 (H) 135 - 145 mmol/L   Potassium 3.2 (L) 3.5 - 5.1 mmol/L   Chloride 104 98 - 111 mmol/L   CO2 33 (H) 22 - 32 mmol/L   Glucose, Bld 102 (H) 70 - 99 mg/dL   BUN 82 (H) 8 - 23 mg/dL   Creatinine, Ser 5.63 (H) 0.61 - 1.24 mg/dL   Calcium  8.5 (L) 8.9 - 10.3 mg/dL   Total Protein 5.1 (L) 6.5 - 8.1 g/dL   Albumin 1.6 (L) 3.5 - 5.0 g/dL   AST 77 (H) 15 - 41 U/L   ALT 88 (H) 0 - 44 U/L   Alkaline Phosphatase 103 38 - 126 U/L   Total Bilirubin 1.0 0.3 - 1.2 mg/dL   GFR calc non Af Amer 23 (L) >60 mL/min   GFR calc Af Amer 27 (L) >60 mL/min   Anion gap 13 5 - 15    Comment: Performed at [facility], [address] [place], [place] [address]  Lactate dehydrogenase     Status: Abnormal   Collection Time: 08/29/19  4:50 PM  Result Value Ref Range   LDH 396 (H) 98 - 192 U/L    Comment:  Performed at [facility], [address] [place], [place] [address]  Glucose, capillary     Status: None   Collection Time: 08/29/19  8:47 PM  Result Value Ref Range   Glucose-Capillary 88 70 - 99 mg/dL  Glucose, capillary     Status: Abnormal   Collection Time: 08/29/19 11:56 PM  Result Value Ref Range   Glucose-Capillary 167 (H) 70 - 99 mg/dL  Glucose, capillary     Status: Abnormal   Collection Time: 08/30/19  4:15 AM  Result Value Ref Range   Glucose-Capillary 116 (H) 70 - 99 mg/dL  Glucose, capillary     Status: Abnormal   Collection Time: 08/30/19  8:27 AM  Result Value Ref Range   Glucose-Capillary 102 (H) 70 - 99 mg/dL  CBC with Differential/Platelet     Status: Abnormal   Collection Time: 08/30/19  9:29 AM  Result Value Ref Range   WBC 17.7 (H) 4.0 - 10.5 K/uL   RBC 3.19 (L) 4.22 - 5.81 MIL/uL   Hemoglobin 10.1 (L) 13.0 - 17.0 g/dL   HCT 19.131.1 (L) 47.839.0 - 29.552.0 %   MCV 97.5 80.0 - 100.0 fL   MCH 31.7 26.0 - 34.0 pg   MCHC 32.5 30.0 - 36.0 g/dL   RDW 62.114.3 30.811.5 - 65.715.5 %   Platelets 312 150 - 400 K/uL    Comment: REPEATED TO VERIFY   nRBC 0.0 0.0 - 0.2 %   Neutrophils Relative % 88 %   Neutro Abs 15.7 (H) 1.7 - 7.7 K/uL   Lymphocytes Relative 4 %   Lymphs Abs 0.7 0.7 - 4.0 K/uL   Monocytes Relative 7 %   Monocytes Absolute 1.2 (H) 0.1 - 1.0 K/uL   Eosinophils Relative 0 %   Eosinophils Absolute 0.0 0.0 - 0.5 K/uL   Basophils Relative 0 %   Basophils Absolute 0.0 0.0 - 0.1 K/uL   Immature Granulocytes 1 %   Abs Immature Granulocytes 0.10 (H) 0.00 - 0.07 K/uL    Comment: Performed at [facility], [address] [place], [place] [address]  Comprehensive metabolic panel     Status: Abnormal   Collection Time: 08/30/19  9:29 AM  Result Value Ref Range   Sodium 153 (H) 135 - 145 mmol/L   Potassium 3.3 (L) 3.5 - 5.1 mmol/L   Chloride 109 98 - 111 mmol/L   CO2 33 (H) 22 - 32 mmol/L   Glucose, Bld 100 (H) 70 - 99 mg/dL   BUN 79 (H) 8 - 23 mg/dL    Creatinine, Ser 2.952.57 (H) 0.61 - 1.24 mg/dL   Calcium 8.5 (L) 8.9 - 10.3  mg/dL   Total Protein 5.4 (L) 6.5 - 8.1 g/dL   Albumin 1.6 (L) 3.5 - 5.0 g/dL   AST 61 (H) 15 - 41 U/L   ALT 74 (H) 0 - 44 U/L   Alkaline Phosphatase 102 38 - 126 U/L   Total Bilirubin 1.1 0.3 - 1.2 mg/dL   GFR calc non Af Amer 25 (L) >60 mL/min   GFR calc Af Amer 29 (L) >60 mL/min   Anion gap 11 5 - 15    Comment: Performed at [facility] [address], [place], [place] [address]  Magnesium     Status: Abnormal   Collection Time: 08/30/19  9:29 AM  Result Value Ref Range   Magnesium 1.2 (L) 1.7 - 2.4 mg/dL    Comment: Performed at [facility] [address], [place], [place] [address]  Phosphorus     Status: None   Collection Time: 08/30/19  9:29 AM  Result Value Ref Range   Phosphorus 4.2 2.5 - 4.6 mg/dL    Comment: Performed at [facility] [address] [place], [place] [address]  Glucose, capillary     Status: None   Collection Time: 08/30/19 12:03 PM  Result Value Ref Range   Glucose-Capillary 91 70 - 99 mg/dL  Glucose, capillary     Status: Abnormal   Collection Time: 08/30/19  4:39 PM  Result Value Ref Range   Glucose-Capillary 165 (H) 70 - 99 mg/dL  Glucose, capillary     Status: Abnormal   Collection Time: 08/30/19  7:51 PM  Result Value Ref Range   Glucose-Capillary 108 (H) 70 - 99 mg/dL  Glucose, capillary     Status: Abnormal   Collection Time: 08/31/19 12:04 AM  Result Value Ref Range   Glucose-Capillary 118 (H) 70 - 99 mg/dL  Glucose, capillary     Status: None   Collection Time: 08/31/19  4:16 AM  Result Value Ref Range   Glucose-Capillary 91 70 - 99 mg/dL  CBC with Differential/Platelet     Status: Abnormal   Collection Time: 08/31/19  4:38 AM  Result Value Ref Range   WBC 17.6 (H) 4.0 - 10.5 K/uL   RBC 3.55 (L) 4.22 - 5.81 MIL/uL   Hemoglobin 11.0 (L) 13.0 - 17.0 g/dL   HCT 34.8 (L) 39.0 - 52.0 %   MCV 98.0 80.0 - 100.0 fL   MCH 31.0 26.0 - 34.0 pg    MCHC 31.6 30.0 - 36.0 g/dL   RDW 14.0 11.5 - 15.5 %   Platelets 352 150 - 400 K/uL   nRBC 0.0 0.0 - 0.2 %   Neutrophils Relative % 93 %   Neutro Abs 16.2 (H) 1.7 - 7.7 K/uL   Lymphocytes Relative 2 %   Lymphs Abs 0.4 (L) 0.7 - 4.0 K/uL   Monocytes Relative 4 %   Monocytes Absolute 0.8 0.1 - 1.0 K/uL   Eosinophils Relative 0 %   Eosinophils Absolute 0.0 0.0 - 0.5 K/uL   Basophils Relative 0 %   Basophils Absolute 0.0 0.0 - 0.1 K/uL   Immature Granulocytes 1 %   Abs Immature Granulocytes 0.11 (H) 0.00 - 0.07 K/uL    Comment: Performed at [facility], [address] [place], [place] [address]  Comprehensive metabolic panel     Status: Abnormal   Collection Time: 08/31/19  4:38 AM  Result Value Ref Range   Sodium 152 (H) 135 - 145 mmol/L  Potassium 3.8 3.5 - 5.1 mmol/L   Chloride 110 98 - 111 mmol/L   CO2 34 (H) 22 - 32 mmol/L   Glucose, Bld 128 (H) 70 - 99 mg/dL   BUN 66 (H) 8 - 23 mg/dL   Creatinine, Ser 8.45 (H) 0.61 - 1.24 mg/dL   Calcium 8.8 (L) 8.9 - 10.3 mg/dL   Total Protein 5.8 (L) 6.5 - 8.1 g/dL   Albumin 1.8 (L) 3.5 - 5.0 g/dL   AST 49 (H) 15 - 41 U/L   ALT 68 (H) 0 - 44 U/L   Alkaline Phosphatase 99 38 - 126 U/L   Total Bilirubin 0.9 0.3 - 1.2 mg/dL   GFR calc non Af Amer 31 (L) >60 mL/min   GFR calc Af Amer 36 (L) >60 mL/min   Anion gap 8 5 - 15    Comment: Performed at [facility], [address] [place], [place] [address]  Phosphorus     Status: Abnormal   Collection Time: 08/31/19  4:38 AM  Result Value Ref Range   Phosphorus 4.7 (H) 2.5 - 4.6 mg/dL    Comment: Performed at [facility], [address] [place], [place] [address]  Magnesium     Status: None   Collection Time: 08/31/19  4:38 AM  Result Value Ref Range   Magnesium 1.8 1.7 - 2.4 mg/dL    Comment: Performed at [facility], [address] [place], [place] [address]  Glucose, capillary     Status: None   Collection Time: 08/31/19  8:00 AM  Result Value Ref Range    Glucose-Capillary 75 70 - 99 mg/dL   Comment 1 Notify RN   Glucose, capillary     Status: Abnormal   Collection Time: 08/31/19 11:42 AM  Result Value Ref Range   Glucose-Capillary 63 (L) 70 - 99 mg/dL   Comment 1 Notify RN   Glucose, capillary     Status: Abnormal   Collection Time: 08/31/19 12:36 PM  Result Value Ref Range   Glucose-Capillary 208 (H) 70 - 99 mg/dL    DG Chest Port 1 View  Result Date: 08/31/2019 CLINICAL DATA:  Pneumothorax EXAM: PORTABLE CHEST 1 VIEW COMPARISON:  Chest radiograph from the prior day. FINDINGS: To left-side pleural pigtail catheters are unchanged in position. A moderate left pneumothorax is increased in size since prior exam. The heart size is unchanged in size. Mild bibasilar atelectasis is not significantly changed. Trace bilateral pleural effusions may contribute. There is pleural thickening along the right lung apex. There is no right pneumothorax. IMPRESSION: 1. Moderate left pneumothorax, increased in size since prior exam. Unchanged position of 2 left-sided pleural pigtail catheters. Electronically Signed   By: Romona Curls M.D.   On: 08/31/2019 09:07   DG Chest Port 1 View  Result Date: 08/30/2019 CLINICAL DATA:  Pneumothorax.  Chest tubes. EXAM: PORTABLE CHEST 1 VIEW COMPARISON:  08/29/2019 08/28/2019.  08/23/2019.  08/21/2019. FINDINGS: Two left chest tubes in stable position. Left subclavian line in stable position. Tiny left apical pneumothorax, improved from prior exam. Trachea is slightly shifted to the right. A left paratracheal mass cannot be completely excluded. An upright PA and lateral chest x-ray suggested for further evaluation. Unchanged right upper lung and bibasilar infiltrates. Small bilateral pleural effusions again noted. Right-sided pleural thickening again noted without interim change. Oral contrast in the colon. Degenerative change thoracic spine. IMPRESSION: 1. Two left chest tubes in stable position. Left subclavian line in  stable position. Tiny left apical pneumothorax again noted. Interim improvement from prior exam. 2. The trachea is slightly shifted to the right. A left paratracheal mass cannot be excluded. An upright PA and lateral chest x-ray suggested for further evaluation. 3. Patchy bilateral pulmonary infiltrates unchanged from prior exam. Small bilateral pleural effusions again noted. Electronically Signed   By: Maisie Fushomas  Register   On: 08/30/2019 08:40   DG Chest Port 1 View  Result Date: 08/29/2019 CLINICAL DATA:  Chest tube positions, evaluate for pneumothorax EXAM: PORTABLE CHEST 1 VIEW COMPARISON:  08/29/2019 at 1122 hours FINDINGS: Two indwelling left chest tubes. Small left apical pneumothorax, unchanged. Small right pleural effusion and small left basilar pleural fluid component. Stable right upper lobe and bibasilar opacities. Mild cardiomegaly. Left subclavian venous catheter terminates in the mid SVC. IMPRESSION: Two indwelling left chest tubes. Small left apical pneumothorax, unchanged. Additional stable findings as above. Electronically Signed   By: Charline BillsSriyesh  Krishnan M.D.   On: 08/29/2019 19:30   VAS US UPPER EXTREMITY ARTERIAL DUPLEX  Result Date: 08/30/2019 UPPER EXTREMITY DUPLEX STUDY Indications: Patient complains of Ischemic thumb.  Comparison Study: No prior studies. Performing Technologist: Chanda BusingGregory Collins RVT  Examination Guidelines: A complete evaluation includes B-mode imaging, spectral Doppler, color Doppler, and power Doppler as needed of all accessible portions of each vessel. Bilateral testing is considered an integral part of a complete examination. Limited examinations for reoccurring indications may be performed as noted.  Left Doppler Findings: +---------------+----------+--------+--------+----------------------------+ Site           PSV (cm/s)WaveformStenosisComments                     +---------------+----------+--------+--------+----------------------------+ Subclavian Prox73                                                      +---------------+----------+--------+--------+----------------------------+ Axillary       97.00                                                  +---------------+----------+--------+--------+----------------------------+ Brachial Prox  74                                                     +---------------+----------+--------+--------+----------------------------+ Brachial Dist  117                                                    +---------------+----------+--------+--------+----------------------------+ Radial Prox    135                                                    +---------------+----------+--------+--------+----------------------------+ Radial Mid     57                                                     +---------------+----------+--------+--------+----------------------------+  Radial Dist    14                        Occlusion noted in the wrist +---------------+----------+--------+--------+----------------------------+ Ulnar Prox     116                                                    +---------------+----------+--------+--------+----------------------------+ Ulnar Mid      81                                                     +---------------+----------+--------+--------+----------------------------+ Ulnar Dist     93                                                     +---------------+----------+--------+--------+----------------------------+ The brachial artery bifurcates into the radial and ulnar arteries in the mid upper arm.  Summary:  Left: Obstruction noted in the distal radial artery. *See table(s) above for measurements and observations. Electronically signed by Waverly Ferrari MD on 08/30/2019 at 4:11:30 PM.    Final    Scheduled Meds: . amiodarone  200 mg Oral Daily  . Chlorhexidine Gluconate Cloth  6 each Topical Daily  . [START ON 09/01/2019] dexamethasone  (DECADRON) injection  4 mg Intravenous Q24H  . furosemide  40 mg Intravenous BID  . heparin  5,000 Units Subcutaneous Q8H  . insulin aspart  0-9 Units Subcutaneous Q4H  . levETIRAcetam  250 mg Oral BID  . mouth rinse  15 mL Mouth Rinse BID  . pantoprazole  40 mg Oral Daily  . sodium chloride flush  10-40 mL Intracatheter Q12H   Continuous Infusions: . sodium chloride 10 mL/hr at 08/29/19 0300  . amiodarone Stopped (08/25/19 2208)  . dextrose 50 mL/hr at 08/31/19 1158   PRN Meds:.sodium chloride, acetaminophen, lidocaine (PF), LORazepam, Resource ThickenUp Clear, sodium chloride flush  Review of Systems  Reason unable to perform ROS: confused and not very cooperative.   Blood pressure 116/74, pulse (!) 50, temperature 97.6 F (36.4 C), temperature source Axillary, resp. rate (!) 28, height 5\' 11"  (1.803 m), weight 63.1 kg, SpO2 100 %. Physical Exam  Constitutional: He appears well-developed. No distress.  Elderly male who appears chronically ill and older than stated age  HENT:  Head: Normocephalic and atraumatic.  Mouth/Throat: Oropharynx is clear and moist.  Eyes: Right eye exhibits no discharge. Left eye exhibits discharge. No scleral icterus.  Lens hazy OU, + arcus senilis  Neck: No JVD present. No tracheal deviation present. No thyromegaly present.  Cardiovascular: Normal rate and regular rhythm.  No murmur heard. Respiratory: No stridor. No respiratory distress. He has no wheezes. He has no rales. He exhibits no tenderness.  GI: He exhibits no distension. There is no abdominal tenderness. There is no rebound and no guarding.  Musculoskeletal:        General: No edema.  Lymphadenopathy:    He has no cervical adenopathy.  Neurological:  Confused with limited verbal response  Skin: Skin is warm and dry. He  is not diaphoretic.    Assessment/Plan: Increases left PNTX with 2 pigtail catheters in place, one on suction. Dr Maren Beach to order CT scan of chest to better  determine more information regarding pulmonary and chest wall pathology. Rowe Clack 08/31/2019, 3:34 PM   Patient has had a recent severe anoxic brain injury and would not meet criteria for general anesthesia needed for VATS at this time.  Recommend continued chest tube management and obtain CT scan for more information to help guide nonsurgical therapy. Lovett Sox MD

## 2019-08-31 NOTE — Progress Notes (Signed)
Hypoglycemic Event  CBG: 63  Treatment: D50 50 mL (25 gm)  Symptoms: None  Follow-up CBG: Time:1236 CBG Result:208  Possible Reasons for Event: Inadequate meal intake  Comments/MD notified: Dr. Jomarie Longs notified. New order received.     Viviana Simpler

## 2019-08-31 NOTE — Progress Notes (Signed)
Palliative:  HPI: 64 y.o. male  with past medical history of unknown medical history admitted on 08/29/2019 with PEA cardiac arrest receiving ROSC after 6 rounds of CPR followed by hypotension, unresponsiveness, atrial fibrillation RVR with septic shock due to Serratia pneumonia. He has been extubated and off vasopressors. Concern for aspiration with increasing oxygen requirements and a tooth was extracted from his lungs via bronchoscopy 08/24/19 and was extubated again 08/27/19. Repeat right thoracentesis 08/29/19.   I met today at Philip Wells bedside but he has recently received Ativan and is sedated. He does not respond to stimuli except slight withdrawal to pain. He is refusing to eat/drink and all care from nursing. He has been combative and aggressive towards staff requiring sedating medications. I called and discussed his decline with sister, Philip Wells. I explained to Philip Wells that he has had a steep decline over the past 2 days. I explained that we continue to support him but if he continues to refuse everything and be combative that he will only continue to decline and worsen and we will be unable to help him to improve. She understands. I encouraged her to consider no reintubation as this would be more decline and I fear he would have even less chance of improvement if he declines further. He continues to be critically ill requiring 2 chest tubes and recent R thoracentesis. He is very tenuous and high risk for acute decompensation in his current state. I also explained that it seems to me with temporal muscle wasting and behavior that he was likely declining in health at home prior to this acute admission and she does confirm this is the case. She will speak with her family more this evening about code status. I will follow up again tomorrow.   Exam: Sedated. Thin/frail. Withdraws to pain. HR 90s. Breathing shallow. Abd tender to touch generalized with withdrawal/grimace during palpation. Generalized  weakness.   Plan: - Family discussing desire for re-intubation. I will follow up tomorrow.   [place], NP Palliative Medicine Team Pager [phone] (Please see [email or web address] for schedule) Team Phone [phone]    Greater than 50%  of this time was spent counseling and coordinating care related to the above assessment and plan

## 2019-08-31 NOTE — Progress Notes (Addendum)
Occupational Therapy Treatment Patient Details Name: Philip Wells MRN: [id number] DOB: May 15, 1956 Today's Date: 08/31/2019    History of present illness Patient is a 64 y/o male admitted 08/18/2019 after unwitnessed cardiac arrest with 6 rounds of CPR and subsequent distributive shock, presumably due to sepsis. ETT 2/2-2/8. Chest tube insertion x2 on 2/2 and 2/3. CXR 2/6 with RLL infiltrate. Small residual left-sided PTX. 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 S/p bronchoscopy with extraction of tooth from airway on 2/10; remained intubated post-op with extubation 2/13. Pt with L thumb ischemia; LUE duplex 2/16 showed obstruction in distal radial artery. No PMH on file.   OT comments  Patient supine in bed and initially agreeable to OT/PT session, reports wanting to get OOB.  Patient then resisting any movement towards EOB or engagement in ADLs. Increased time to process and follow minimal commands, but inconsistently.  Requires +2 total to reposition towards HOB. Session limited by patient confusion, agitation with mobilization. Will follow acutely, OT to re-assess goals and dc plan next session.    Follow Up Recommendations  CIR;Supervision/Assistance - 24 hour    Equipment Recommendations  3 in 1 bedside commode    Recommendations for Other Services Rehab consult    Precautions / Restrictions Precautions Precautions: Fall;Other (comment) Precaution Comments: Multiple lines, 2x chest tubes, rectal tube Restrictions Weight Bearing Restrictions: No       Mobility Bed Mobility Overal bed mobility: Needs Assistance Bed Mobility: Rolling Rolling: Max assist;+2 for safety/equipment   Supine to sit: Mod assist;HOB elevated Sit to supine: Max assist;+2 for physical assistance;+2 for safety/equipment   General bed mobility comments: pt resisting movement to EOB, total assist to bring legs towards EOB and pt pulls back to bed; total  assist +2 to reposition towards [facility]  Transfers   General transfer comment: deferred     Balance                           ADL either performed or assessed with clinical judgement   ADL                                         General ADL Comments: pt declined engagment in ADls at this time      Vision       Perception     Praxis      Cognition Arousal/Alertness: Awake/alert;Lethargic Behavior During Therapy: Flat affect Overall Cognitive Status: Impaired/Different from baseline Area of Impairment: Orientation;Attention;Memory;Following commands;Safety/judgement;Problem solving;Awareness                 Orientation Level: Disoriented to Current Attention Level: Focused Memory: Decreased short-term memory;Decreased recall of precautions Following Commands: Follows one step commands inconsistently;Follows one step commands with increased time Safety/Judgement: Decreased awareness of safety;Decreased awareness of deficits Awareness: Intellectual Problem Solving: Slow processing;Decreased initiation;Difficulty sequencing;Requires verbal cues;Requires tactile cues General Comments: patient with eyes wide open and twitching, patient verbalizing throughout session with increased time but at times difficult to understand due to mumbling. Increased time to follow simple commands inconsistently; resisting mobility even though reporting wanting to get OOB.         Exercises   Shoulder Instructions       General Comments VSS on [place] 3L     Pertinent Vitals/ Pain       Pain Assessment: Faces Faces  Pain Scale: Hurts a little bit Pain Location: Grimacing and resisting ROM, unable to specify pain Pain Descriptors / Indicators: Grimacing  Home Living                                          Prior Functioning/Environment              Frequency  Min 3X/week        Progress Toward Goals  OT Goals(current goals can  now be found in the care plan section)  Progress towards OT goals: Not progressing toward goals - comment;OT to reassess next treatment(limited participation, agitation)  Acute Rehab OT Goals Patient Stated Goal: none stated  Plan Discharge plan remains appropriate;Frequency remains appropriate    Co-evaluation    PT/OT/SLP Co-Evaluation/Treatment: Yes Reason for Co-Treatment: Complexity of the patient's impairments (multi-system involvement)          AM-PAC OT "6 Clicks" Daily Activity     Outcome Measure   Help from another person eating meals?: A Lot Help from another person taking care of personal grooming?: A Lot Help from another person toileting, which includes using toliet, bedpan, or urinal?: Total Help from another person bathing (including washing, rinsing, drying)?: A Lot Help from another person to put on and taking off regular upper body clothing?: A Lot Help from another person to put on and taking off regular lower body clothing?: A Lot 6 Click Score: 11    End of Session Equipment Utilized During Treatment: Oxygen(3L)  OT Visit Diagnosis: Other abnormalities of gait and mobility (R26.89);Muscle weakness (generalized) (M62.81);Other symptoms and signs involving cognitive function   Activity Tolerance Treatment limited secondary to agitation   Patient Left in bed;with call bell/phone within reach;with bed alarm set   Nurse Communication Mobility status        Time: 8841-6606 OT Time Calculation (min): 25 min  Charges: OT General Charges $OT Visit: 1 Visit OT Treatments $Therapeutic Activity: 8-22 mins  Philip Wells, OT Acute Rehabilitation Services Pager [phone] Office [phone]    Philip Wells 08/31/2019, 3:23 PM

## 2019-08-31 NOTE — Progress Notes (Signed)
Patient agitated. Patient does not want V/s or anything done. States, "leave me alone".  IV Ativan given per MD order at this time. V/s and BG checked after ativan given. Will continue to monitor.

## 2019-08-31 NOTE — Progress Notes (Signed)
Physical Therapy Treatment Patient Details Name: Philip Wells MRN: [id number] DOB: 1955/08/17 Today's Date: 08/31/2019    History of Present Illness Patient is a 64 y/o male admitted 08/24/2019 after unwitnessed cardiac arrest with 6 rounds of CPR and subsequent distributive shock, presumably due to sepsis. ETT 2/2-2/8. Chest tube insertion x2 on 2/2 and 2/3. CXR 2/6 with RLL infiltrate. Small residual left-sided PTX. 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 S/p bronchoscopy with extraction of tooth from airway on 2/10; remained intubated post-op with extubation 2/13. Pt with L thumb ischemia; LUE duplex 2/16 showed obstruction in distal radial artery. No PMH on file.    PT Comments    Patient seen for mobility progression. Pt continues to be confused and , at times, speech is unintelligible. Pt reports wanting to get OOB however resists all attempts to mobilize. PT will continue to follow acutely.   Follow Up Recommendations  CIR;Supervision/Assistance - 24 hour     Equipment Recommendations  (defer)    Recommendations for Other Services Rehab consult     Precautions / Restrictions Precautions Precautions: Fall;Other (comment) Precaution Comments: Multiple lines, 2x chest tubes, rectal tube Restrictions Weight Bearing Restrictions: No    Mobility  Bed Mobility Overal bed mobility: Needs Assistance             General bed mobility comments: pt resisting movement to EOB, total assist to bring legs towards EOB and pt pulls back to bed; total assist +2 to reposition towards [facility]  Transfers                 General transfer comment: deferred   Ambulation/Gait                 Stairs             Wheelchair Mobility    Modified Rankin (Stroke Patients Only)       Balance                                            Cognition Arousal/Alertness: Awake/alert;Lethargic Behavior During  Therapy: Flat affect Overall Cognitive Status: Impaired/Different from baseline Area of Impairment: Orientation;Attention;Memory;Following commands;Safety/judgement;Problem solving;Awareness                   Current Attention Level: Focused Memory: Decreased short-term memory;Decreased recall of precautions Following Commands: Follows one step commands inconsistently;Follows one step commands with increased time Safety/Judgement: Decreased awareness of safety;Decreased awareness of deficits Awareness: Intellectual Problem Solving: Slow processing;Decreased initiation;Difficulty sequencing;Requires verbal cues;Requires tactile cues General Comments: patient with eyes wide open and twitching, patient verbalizing throughout session with increased time but at times difficult to understand due to mumbling. Increased time to follow simple commands inconsistently; resisting mobility even though reporting wanting to get OOB.       Exercises      General Comments General comments (skin integrity, edema, etc.): VSS on [place] 3L       Pertinent Vitals/Pain Pain Assessment: Faces Faces Pain Scale: Hurts a little bit Pain Location: Grimacing and resisting ROM, unable to specify pain Pain Descriptors / Indicators: Grimacing Pain Intervention(s): Limited activity within patient's tolerance;Monitored during session;Repositioned    Home Living                      Prior Function  PT Goals (current goals can now be found in the care plan section) Acute Rehab PT Goals Patient Stated Goal: none stated Progress towards PT goals: Not progressing toward goals - comment    Frequency    Min 3X/week      PT Plan Current plan remains appropriate    Co-evaluation PT/OT/SLP Co-Evaluation/Treatment: Yes Reason for Co-Treatment: Complexity of the patient's impairments (multi-system involvement) PT goals addressed during session: Mobility/safety with mobility         AM-PAC PT "6 Clicks" Mobility   Outcome Measure  Help needed turning from your back to your side while in a flat bed without using bedrails?: Total Help needed moving from lying on your back to sitting on the side of a flat bed without using bedrails?: Total Help needed moving to and from a bed to a chair (including a wheelchair)?: Total Help needed standing up from a chair using your arms (e.g., wheelchair or bedside chair)?: Total Help needed to walk in hospital room?: Total Help needed climbing 3-5 steps with a railing? : Total 6 Click Score: 6    End of Session Equipment Utilized During Treatment: Oxygen Activity Tolerance: Treatment limited secondary to agitation Patient left: in bed;with call bell/phone within reach;with bed alarm set Nurse Communication: Mobility status PT Visit Diagnosis: Muscle weakness (generalized) (M62.81)     Time: 5364-6803 PT Time Calculation (min) (ACUTE ONLY): 25 min  Charges:  $Therapeutic Activity: 8-22 mins                     Philip Wells, PTA Acute Rehabilitation Services Pager: [phone] Office: [phone]     Philip Wells 08/31/2019, 4:20 PM

## 2019-08-31 NOTE — Progress Notes (Signed)
Patient's BG 76. Patient will not have anything by mouth. He refuses. Per Dr. Jomarie Longs, continue D5 at 50 ml/hr through the night. Will continue to monitor.   Ernestina Columbia, RN

## 2019-08-31 NOTE — Progress Notes (Signed)
Patient refused having wounds to face and lip measured.

## 2019-08-31 NOTE — Progress Notes (Addendum)
NAME:  Philip Wells, MRN:  [id number], DOB:  August 17, 1955, LOS: 29 ADMISSION DATE:  09/08/2019, CONSULTATION DATE:  08/25/2019 REFERRING MD:  ED - [facility], CHIEF COMPLAINT:  Status post cardiac arrest.   Brief History   [name] with PEA cardiac arrest and subsequent distributive shock, presumably due to sepsis, AFwRVR.  STEMI ruled out. [name].    Past Medical History  ETOH use- wine  Significant Hospital Events   2/02 admitted with undifferentiated shock following cardiac arrest 2/04 continued pressor requirements.  Completed 36 degree TTM 2/06 more awake today.  Weaning pressor requirements. 2/07 Off pressors 2/08 extubated  2/10 Reintubated, FOB with tooth removal. Diltiazem added for AF rate control. R thora 2/12 Weaning on 10/5 2/13 Apneic during wean, back on full support. Minimal chest tube output overnight  2/14 transfer out of ICU 2/15 small increase in left PTX, chest tube placed back to suction.  R thora by IR   Consults:  Cardiology - Patient R/O for STEMI  Procedures:  ETT 2/2 >> 2/8, 2/10 > 2/13 Foley 2/2 >> 2/12 L [place] TLC 2/5 >>  L CT 2/2 >>  L CT #2 2/3 >> L Radial Aline 2/2 >> 2/5 08/29/2019 right thoracentesis per interventional radiology with 200 cc fluid obtained  Significant Diagnostic Tests:  CT Head 2/2 >> severe atrophy but no hemorrhage  Renal US >> increased echogenicity consistent with medical renal disease ECHO 2/3 >> normal EF, Grade II diastolic dysfunction  EEG 2/4 >> diffuse encephalopathy but no seizures CXR 2/17 >  Moderate left pneumothorax, increased in size since prior exam. Unchanged position of 2 left-sided pleural pigtail catheters.  Micro Data:  COVID 2/2 >> negatve Flu A/B 2/2 >> negative  BCx2 2/3 >> NG Tracheal Aspirate 2/2 >> moderate serratia marcescens, S-ceftriaxone, cefepime BAL 2/10 >> GS with rare Gram - rods, 10,000 Candida Albicans  R Pleural Fluid 2/10 > ngtd   Antimicrobials:    Vanco 2/2 >> 2/5  Cefepime 2/2/ >> 2/9  Vanc 2/12> 2/14 Zosyn 2/12> 2/14  Interim history/subjective:  Lying in bed with no acute complaints, RN reports no acute events overnight   Objective   Blood pressure 120/74, pulse 95, temperature 97.6 F (36.4 C), temperature source Axillary, resp. rate (!) 22, height _0  (1.803 m), weight 63.1 kg, SpO2 100 %.        Intake/Output Summary (Last 24 hours) at 08/31/2019 1326 Last data filed at 08/31/2019 1145 Gross per 24 hour  Intake 516 ml  Output 4841 ml  Net -4325 ml   Filed Weights   08/27/19 0400 08/28/19 0500 08/29/19 0326  Weight: 70.7 kg 70.1 kg 63.1 kg    Examination:  General: Chronically ill appearing elderly deconditioned male lying in bed, in NAD HEENT: [place]/AT, MM pink/moist, PERRL,  Neuro: Alert and interactive but no cooroperative with questions  CV: s1s2 regular rate and rhythm, no murmur, rubs, or gallops,  PULM:  CTx2 in place, CT #2 placed back to suction, no added breath sounds, no increased work of breathing  GI: soft, bowel sounds active in all 4 quadrants, non-tender, non-distended Extremities: warm/dry, no edema  Skin: no rashes or lesions  Resolved Hospital Problem list   Cardiac arrest  Shock - suspect septic  Hypoglycemia  Sepsis secondary to Serratia PNA   Assessment & Plan:   Acute Hypoxemic Respiratory Failure  -Failed extubation 2/8, aspirated 2/9 with swallow evaluation  -Had FOB 2/10 for tooth extraction.  -  Extubated 2/13 Plan: Continue supplemental oxygen, wean as able  Supportive care  Encourage pulmonary hygiene   Left Hydropneumothorax - slight increase in PTX 2/15. -Moderate left pneumothorax, increased in size since prior exam with 2 chest tube in place as seen on CXR 2/17 Plan: Despite increasing pneumothorax patient remains stable on 3L [place] CTx2 back to suction Consult TCTS Serial CXR  Right Pleural Effusion s/p thoracentesis 2/10 and 2/15 (NGTD). Plan: Continue to  follow serial CXRs    Sepsis secondary to Serratia PNA  - s/p course cefepime (end date 2/9).  Had worsening leukocytosis following cessation of cefepime; however, patient is also on decadron (3m q6hr started 2/10). -BAL 2/10 with GS revealing rare Gram - rods, candida (likely colonization)  PCT 6.04, but in setting of impaired renal function this is not a great marker Plan: Continue to wean steroids  Trend WBC and fever curve    Rest per primary team.  WJohnsie Cancel NP-C [facility] Contact / Pager information can be found on Amion  08/31/2019, 1:45 PM   PCCM attending:  This is a 64year old gentleman status post PEA cardiac arrest, left-sided pneumothorax with prolonged persistent air leak and a hydropneumothorax on the left.  Ultimately end of having 2 separate chest tubes placed.  We had a trial overnight with both chest tubes on waterseal.  Unfortunately the air leak is still present and the pneumothorax was worsened by this morning.  The second chest tube was put back to suction.  BP 116/74 (BP Location: Right Arm)   Pulse (!) 50   Temp 97.6 F (36.4 C) (Axillary)   Resp (!) 28   Ht _0  (1.803 m)   Wt 63.1 kg   SpO2 100%   BMI 19.40 kg/m   General: Elderly cachectic male resting in bed HEENT: NCAT trachea midline tracking appropriately Heart: Regular rate rhythm S1-S2 Lungs: Clear to auscultation, left-sided chest drains in place.  Labs: Serum creatinine improving 2.19, white blood cell count 17.6 stable Chest x-ray: Worsening left-sided pneumothorax both tubes in place to waterseal.  I reviewed these images myself this morning.  Assessment: Acute hypoxemic respiratory failure back on 3 L nasal cannula Left-sided pneumothorax, air leak Failed trial on waterseal Right-sided effusion, transudate if  Plan: Discussed with Dr. JBroadus John Plans to consult cardiothoracic surgery for recommendations and persistent air leak. May need to  consider pleurodesis.  He has had chest tubes in for some time. Would consider CT of the chest. However we will wait and see what cardiothoracic surgery would like to do.  BGarner Nash DO [facility] 08/31/2019 3:38 PM

## 2019-08-31 NOTE — Plan of Care (Signed)
Continue to monitor

## 2019-08-31 NOTE — Progress Notes (Signed)
Inpatient Rehab Admissions Coordinator:   Met with pt at bedside.  Confused.  Chest tubes remain in place.  Will follow.   Shann Medal, PT, DPT Admissions Coordinator [phone] 08/31/19  12:54 PM

## 2019-08-31 NOTE — Progress Notes (Addendum)
PROGRESS NOTE    Philip Wells  GPQ:[id number] DOB: [birth year] DOA: 08/19/2019 PCP: Patient, No Pcp Per   Brief Narrative:  The patient is a 64 year old male with a PEA cardiac arrest and subsequent distributive shock presumably due to sepsis and A. fib with RVR.  He had a STEMI ruled out and he was also found to have Serratia pneumonia. Treated with targeted temperature management on 08/18/2019 and continued pressor requirement.  Weaned off pressors on 2/8,  on 2/8 he was extubated but then on 2/10 he was reintubated with FOB tooth removal.  He still had atrial fibrillation so diltiazem was added.  He was extubated 08/27/19 he was transferred to the medical floor on 08/28/2019 and transferred to [facility] service on 08/28/2018 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 fib RVR -Unlikely to be PE or ACS with relatively normal TTE, also preserved EF and normal wall motion -Initially had shock requiring vasopressors  -BP stable and improved now    Sepsis secondary to Serratia PNA  -completed course cefepime, end date 2/9 -sepsis physiology has resolved  Acute Hypoxemic Respiratory Failure requiring re-intubation status post 2 chest tubes Right pleural effusion status post thoracentesis Left hydropneumothorax -Failed extubation 2/8, aspirated 2/9 with swallow evaluation > worsening O2 needs after, re-intubated 2/10.   FOB 2/10 for tooth extraction.  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, CT head notes baseline cerebral atrophy  -In addition likely has delirium from prolonged hospitalization -Continue delirium precautions -Continue Keppra twice daily will decrease dose -Unclear why he is getting Decadron, started in the ICU on 2/10 will decrease dose  AKI, slowly improving  Hypokalemia Hypernatremia -Continue lasix , decrease dose, sodium and creatinine improving despite diuresis -Patient's BUN/creatinine is trending down and went from 104/3.53 and today 79/2.57 -Avoid further nephrotoxic medications, contrast dyes, hypotension and renally adjust medications -Continue to monitor and trend renal function -Repeat CMP in the a.m.  Hypomagnesemia -replaced  Atrial Fibrillation, rate controlled at present -continue Amiodarone  Mild Transaminitis  -Suspect multifactorial in setting of baseline ETOH use, shock from cardiac arrest  -Trend  Normocytic Anemia, stable  -Stable  Moderate Malnutrition in the Context of Chronic Illness -SLP evaluated and placed the patient on dysphagia 3 diet with nectar thick liquids -Continue to monitor for advancement -May need D5W free water given his hypernatremia but will hold off given that he is currently being diuresed -Dietitian consulted   Dry gangrene of the left thumb -Does not have any radial pulse on the left -Appreciate vascular and Ortho input -Recommended to continue to monitor at this time, not appropriate for arteriogram at this time given advanced CKD  DVT prophylaxis: Subcu heparin Code Status: Patient is a partial code and no CPR should be initiated no defibrillator and no ACLS medications; intubation and PPV is okay Family Communication: No family present at bedside Disposition Plan: Had a PEA arrest, ongoing agitation and hydropneumothorax with 2 chest tubes   Consultants:   PCCM Transfer/Pulmonary  Cardiology will rule the patient out for STEMI  Palliative care  Orthopedic hand  surgery  Vascular surgery  Procedures:  ETT 2/2 >> 2/8, 2/10 > 2/13 Foley 2/2 >>  L [place] TLC 2/5 >>  L CT 2/2 >>  L CT #2 2/3 >> L Radial Aline 2/2   Significant Diagnostic Tests:   CT Head 2/2 >> severe atrophy but no hemorrhage   Renal US >> increased echogenicity consistent with medical renal disease  ECHO 2/3 >> normal EF, Grade II diastolic dysfunction   EEG 2/4 >> diffuse encephalopathy but no seizures  R Thoracentesis 2/10 >>  Repeat right thoracentesis on 08/29/2019    Micro Data:  COVID 2/2 >> negatve Flu A/B 2/2 >> negative  BCx2 2/3 >> NG Tracheal Aspirate 2/2 >> moderate serratia marcescens, S-ceftriaxone, cefepime BAL 2/10 >> GS with rare Gram - rods, 10,000 Candida Albicans  R Pleural Fluid 2/10 > ngtd     Antimicrobials:  Anti-infectives (From admission, onward)   Start     Dose/Rate Route Frequency Ordered Stop   08/26/19 1400  vancomycin (VANCOREADY) IVPB 1500 mg/300 mL     1,500 mg 150 mL/hr over 120 Minutes Intravenous  Once 08/26/19 1145 08/26/19 1623   08/26/19 1230  piperacillin-tazobactam (ZOSYN) IVPB 2.25 g  Status:  Discontinued     2.25 g 100 mL/hr over 30 Minutes Intravenous Every 8 hours 08/26/19 1145 08/28/19 1433   08/26/19 1145  vancomycin variable dose per unstable renal function (pharmacist dosing)  Status:  Discontinued      Does not apply See admin instructions 08/26/19 1145 08/28/19 1433   08/23/19 1000  ceFEPIme (MAXIPIME) 2 g in sodium chloride 0.9 % 100 mL IVPB     2 g 200 mL/hr over 30 Minutes Intravenous Every 24 hours 08/22/19 1437 08/23/19 1250   08/20/19 1000  ceFEPIme (MAXIPIME) 2 g in sodium chloride 0.9 % 100 mL IVPB  Status:  Discontinued     2 g 200 mL/hr over 30 Minutes Intravenous Every 12 hours 08/20/19 0914 08/22/19 1437   08/19/19 0930  vancomycin (VANCOREADY) IVPB 1250 mg/250 mL     1,250 mg 166.7 mL/hr over 90 Minutes Intravenous  Once 08/19/19 0904 08/19/19 1154   08/18/19 0045  vancomycin (VANCOREADY)  IVPB 1250 mg/250 mL     1,250 mg 166.7 mL/hr over 90 Minutes Intravenous  Once 08/18/19 0031 08/18/19 0258   09/09/2019 2115  ceFEPIme (MAXIPIME) 2 g in sodium chloride 0.9 % 100 mL IVPB  Status:  Discontinued     2 g 200 mL/hr over 30 Minutes Intravenous Every 24 hours 08/28/2019 2114 08/20/19 0914   08/18/2019 2115  vancomycin (VANCOCIN) IVPB 1000 mg/200 mL premix     1,000 mg 200 mL/hr over 60 Minutes Intravenous  Once 08/18/2019 2114 08/17/19 0031   09/11/2019 2114  vancomycin variable dose per unstable renal function (pharmacist dosing)  Status:  Discontinued      Does not apply See admin instructions 09/07/2019 2114 08/20/19 0827     Subjective:  -Overnight with mild agitation, was trying to get out of bed, more calm this morning  -Denies any new complaints, mild discomfort at the chest tube site  -Staff reports poor p.o. intake  Objective: Vitals:   08/30/19 1953 08/30/19 2358 08/31/19 0605 08/31/19 0756  BP:  126/68 113/82 114/75  Pulse: (!) 131 100 90 96  Resp: (!) 24 (!) 24 20 (!) 22  Temp:    (!) 97.4 F (36.3 C)  TempSrc:    Axillary  SpO2: (!) 80% 100% 100% 100%  Weight:  Height:        Intake/Output Summary (Last 24 hours) at 08/31/2019 1128 Last data filed at 08/31/2019 2482 Gross per 24 hour  Intake 516 ml  Output 3841 ml  Net -3325 ml   Filed Weights   08/27/19 0400 08/28/19 0500 08/29/19 0326  Weight: 70.7 kg 70.1 kg 63.1 kg   Examination: Physical Exam:  Gen: Chronically ill appearing African-American male, uncomfortable, oriented to self and partly to place only HEENT: Poor oral hygiene and dental hygiene Lungs: Poor air movement bilaterally, 2 chest tubes noted on the right CVS: S1-S2, regular rate rhythm  abd: soft, Non tender, non distended, BS present Extremities: No edema, dry gangrene of his left index finger Skin: As above Psychiatric: Impaired judgment and insight.  Somewhat agitated mood and appropriate affect.   Data Reviewed: I have  personally reviewed following labs and imaging studies  CBC: Recent Labs  Lab 08/26/19 0341 08/26/19 0341 08/27/19 0455 08/28/19 0507 08/29/19 0348 08/30/19 0929 08/31/19 0438  WBC 28.7*   < > 22.4* 24.5* 20.6* 17.7* 17.6*  NEUTROABS 27.0*  --  21.0* 22.8*  --  15.7* 16.2*  HGB 10.1*   < > 10.0* 10.7* 10.9* 10.1* 11.0*  HCT 29.9*   < > 28.7* 32.0* 33.1* 31.1* 34.8*  MCV 93.7   < > 90.5 93.8 95.1 97.5 98.0  PLT 197   < > 243 286 314 312 352   < > = values in this interval not displayed.   Basic Metabolic Panel: Recent Labs  Lab 08/25/19 2211 08/26/19 0341 08/27/19 0455 08/27/19 0455 08/28/19 0507 08/29/19 0348 08/29/19 1650 08/30/19 0929 08/31/19 0438  NA 144   < > 144   < > 147* 149* 150* 153* 152*  K 3.5   < > 3.2*   < > 3.0* 3.3* 3.2* 3.3* 3.8  CL 102   < > 100   < > 103 104 104 109 110  CO2 30   < > 32   < > 32 32 33* 33* 34*  GLUCOSE 174*   < > 143*   < > 138* 108* 102* 100* 128*  BUN 103*   < > 111*   < > 104* 90* 82* 79* 66*  CREATININE 4.24*   < > 4.05*   < > 3.53* 3.01* 2.74* 2.57* 2.15*  CALCIUM 8.4*   < > 8.4*   < > 8.7* 8.5* 8.5* 8.5* 8.8*  MG 1.7  --  1.6*  --   --  1.4*  --  1.2* 1.8  PHOS  --   --   --   --   --   --   --  4.2 4.7*   < > = values in this interval not displayed.   GFR: Estimated Creatinine Clearance: 31 mL/min (A) (by C-G formula based on SCr of 2.15 mg/dL (H)). Liver Function Tests: Recent Labs  Lab 08/28/19 0507 08/29/19 0348 08/29/19 1650 08/30/19 0929 08/31/19 0438  AST 129* 92* 77* 61* 49*  ALT 112* 96* 88* 74* 68*  ALKPHOS 135* 112 103 102 99  BILITOT 0.9 0.8 1.0 1.1 0.9  PROT 5.3* 5.1* 5.1* 5.4* 5.8*  ALBUMIN 1.5* 1.5* 1.6* 1.6* 1.8*   No results for input(s): LIPASE, AMYLASE in the last 168 hours. No results for input(s): AMMONIA in the last 168 hours. Coagulation Profile: Recent Labs  Lab 08/26/19 1005  INR 1.0   Cardiac Enzymes: No results for input(s): CKTOTAL, CKMB, CKMBINDEX, TROPONINI in the last  168  hours. BNP (last 3 results) No results for input(s): PROBNP in the last 8760 hours. HbA1C: No results for input(s): HGBA1C in the last 72 hours. CBG: Recent Labs  Lab 08/30/19 1639 08/30/19 1951 08/31/19 0004 08/31/19 0416 08/31/19 0800  GLUCAP 165* 108* 118* 91 75   Lipid Profile: No results for input(s): CHOL, HDL, LDLCALC, TRIG, CHOLHDL, LDLDIRECT in the last 72 hours. Thyroid Function Tests: No results for input(s): TSH, T4TOTAL, FREET4, T3FREE, THYROIDAB in the last 72 hours. Anemia Panel: No results for input(s): VITAMINB12, FOLATE, FERRITIN, TIBC, IRON, RETICCTPCT in the last 72 hours. Sepsis Labs: Recent Labs  Lab 08/26/19 1005  PROCALCITON 6.04    Recent Results (from the past 240 hour(s))  Body fluid culture (includes gram stain)     Status: None   Collection Time: 08/24/19  9:40 AM   Specimen: Pleural Fluid  Result Value Ref Range Status   Specimen Description PLEURAL RIGHT  Final   Special Requests NONE  Final   Gram Stain   Final    RARE WBC PRESENT, PREDOMINANTLY MONONUCLEAR NO ORGANISMS SEEN    Culture   Final    NO GROWTH 3 DAYS Performed at [facility], [place] [address] [place], [place] [address]    Report Status 08/27/2019 FINAL  Final  Culture, bal-quantitative     Status: Abnormal   Collection Time: 08/24/19 10:39 AM   Specimen: Bronchoalveolar Lavage; Respiratory  Result Value Ref Range Status   Specimen Description BRONCHIAL ALVEOLAR LAVAGE RIGHT LOWER LUNG  Final   Special Requests NONE  Final   Gram Stain   Final    RARE WBC PRESENT,BOTH PMN AND MONONUCLEAR RARE GRAM NEGATIVE RODS Performed at [facility], [place] [address], [place], [place] [address]    Culture 10,000 COLONIES/mL CANDIDA ALBICANS (A)  Final   Report Status 08/26/2019 FINAL  Final  Gram stain     Status: None   Collection Time: 08/29/19 11:47 AM   Specimen: Lung, Right; Pleural Fluid  Result Value Ref Range Status   Specimen Description PLEURAL RIGHT   Final   Special Requests NONE  Final   Gram Stain   Final    RARE WBC PRESENT,BOTH PMN AND MONONUCLEAR NO ORGANISMS SEEN Performed at [facility], [place] [address], [place], [place] [address]    Report Status 08/29/2019 FINAL  Final  Culture, body fluid-bottle     Status: None (Preliminary result)   Collection Time: 08/29/19 11:47 AM   Specimen: Pleura  Result Value Ref Range Status   Specimen Description PLEURAL RIGHT  Final   Special Requests NONE  Final   Culture   Final    NO GROWTH 2 DAYS Performed at [facility] [address], [place], [place] [address]    Report Status PENDING  Incomplete     RN Pressure Injury Documentation: Pressure Injury 08/23/19 Head Left Stage 1 -  Intact skin with non-blanchable redness of a localized area usually over a bony prominence. Small Pressure Injury from Forehead Probe (Active)  08/23/19 0800  Location: Head  Location Orientation: Left  Staging: Stage 1 -  Intact skin with non-blanchable redness of a localized area usually over a bony prominence.  Wound Description (Comments): Small Pressure Injury from Forehead Probe  Present on Admission: No     Pressure Injury 08/24/19 Lip Upper Stage 2 -  Partial thickness loss of dermis presenting as a shallow open injury with a red, pink wound  bed without slough. pink with white/yellow center in middle (Active)  08/24/19 2000  Location: Lip  Location Orientation: Upper  Staging: Stage 2 -  Partial thickness loss of dermis presenting as a shallow open injury with a red, pink wound bed without slough.  Wound Description (Comments): pink with white/yellow center in middle  Present on Admission: No     Pressure Injury 08/27/19 Face Left Stage 3 -  Full thickness tissue loss. Subcutaneous fat may be visible but bone, tendon or muscle are NOT exposed. (Active)  08/27/19 1600  Location: Face  Location Orientation: Left  Staging: Stage 3 -  Full thickness tissue loss. Subcutaneous fat may  be visible but bone, tendon or muscle are NOT exposed.  Wound Description (Comments):   Present on Admission:      Estimated body mass index is 19.4 kg/m as calculated from the following:   Height as of this encounter: _0  (1.803 m).   Weight as of this encounter: 63.1 kg.  Malnutrition Type:  Nutrition Problem: Moderate Malnutrition Etiology: chronic illness, social / environmental circumstances   Malnutrition Characteristics:  Signs/Symptoms: mild fat depletion, moderate muscle depletion   Nutrition Interventions:  Interventions: Magic cup, Hormel Shake   Radiology Studies: DG Chest Port 1 View  Result Date: 08/31/2019 CLINICAL DATA:  Pneumothorax EXAM: PORTABLE CHEST 1 VIEW COMPARISON:  Chest radiograph from the prior day. FINDINGS: To left-side pleural pigtail catheters are unchanged in position. A moderate left pneumothorax is increased in size since prior exam. The heart size is unchanged in size. Mild bibasilar atelectasis is not significantly changed. Trace bilateral pleural effusions may contribute. There is pleural thickening along the right lung apex. There is no right pneumothorax. IMPRESSION: 1. Moderate left pneumothorax, increased in size since prior exam. Unchanged position of 2 left-sided pleural pigtail catheters. Electronically Signed   By: Zerita Boers M.D.   On: 08/31/2019 09:07   DG Chest Port 1 View  Result Date: 08/30/2019 CLINICAL DATA:  Pneumothorax.  Chest tubes. EXAM: PORTABLE CHEST 1 VIEW COMPARISON:  08/29/2019 08/28/2019.  08/23/2019.  08/20/2019. FINDINGS: Two left chest tubes in stable position. Left subclavian line in stable position. Tiny left apical pneumothorax, improved from prior exam. Trachea is slightly shifted to the right. A left paratracheal mass cannot be completely excluded. An upright PA and lateral chest x-ray suggested for further evaluation. Unchanged right upper lung and bibasilar infiltrates. Small bilateral pleural effusions  again noted. Right-sided pleural thickening again noted without interim change. Oral contrast in the colon. Degenerative change thoracic spine. IMPRESSION: 1. Two left chest tubes in stable position. Left subclavian line in stable position. Tiny left apical pneumothorax again noted. Interim improvement from prior exam. 2. The trachea is slightly shifted to the right. A left paratracheal mass cannot be excluded. An upright PA and lateral chest x-ray suggested for further evaluation. 3. Patchy bilateral pulmonary infiltrates unchanged from prior exam. Small bilateral pleural effusions again noted. Electronically Signed   By: Marcello Moores  Register   On: 08/30/2019 08:40   DG Chest Port 1 View  Result Date: 08/29/2019 CLINICAL DATA:  Chest tube positions, evaluate for pneumothorax EXAM: PORTABLE CHEST 1 VIEW COMPARISON:  08/29/2019 at 1122 hours FINDINGS: Two indwelling left chest tubes. Small left apical pneumothorax, unchanged. Small right pleural effusion and small left basilar pleural fluid component. Stable right upper lobe and bibasilar opacities. Mild cardiomegaly. Left subclavian venous catheter terminates in the mid SVC. IMPRESSION: Two indwelling left chest tubes. Small left apical pneumothorax, unchanged. Additional  stable findings as above. Electronically Signed   By: Julian Hy M.D.   On: 08/29/2019 19:30   VAS [place] UPPER EXTREMITY ARTERIAL DUPLEX  Result Date: 08/30/2019 UPPER EXTREMITY DUPLEX STUDY Indications: Patient complains of Ischemic thumb.  Comparison Study: No prior studies. Performing Technologist: Oliver Hum RVT  Examination Guidelines: A complete evaluation includes B-mode imaging, spectral Doppler, color Doppler, and power Doppler as needed of all accessible portions of each vessel. Bilateral testing is considered an integral part of a complete examination. Limited examinations for reoccurring indications may be performed as noted.  Left Doppler Findings:  +---------------+----------+--------+--------+----------------------------+ Site           PSV (cm/s)WaveformStenosisComments                     +---------------+----------+--------+--------+----------------------------+ Subclavian Prox73                                                     +---------------+----------+--------+--------+----------------------------+ Axillary       97.00                                                  +---------------+----------+--------+--------+----------------------------+ Brachial Prox  74                                                     +---------------+----------+--------+--------+----------------------------+ Brachial Dist  117                                                    +---------------+----------+--------+--------+----------------------------+ Radial Prox    135                                                    +---------------+----------+--------+--------+----------------------------+ Radial Mid     57                                                     +---------------+----------+--------+--------+----------------------------+ Radial Dist    14                        Occlusion noted in the wrist +---------------+----------+--------+--------+----------------------------+ Ulnar Prox     116                                                    +---------------+----------+--------+--------+----------------------------+ Ulnar Mid      81                                                     +---------------+----------+--------+--------+----------------------------+  Ulnar Dist     93                                                     +---------------+----------+--------+--------+----------------------------+ The brachial artery bifurcates into the radial and ulnar arteries in the mid upper arm.  Summary:  Left: Obstruction noted in the distal radial artery. *See table(s) above for measurements and  observations. Electronically signed by Deitra Mayo MD on 08/30/2019 at 4:11:30 PM.    Final    Scheduled Meds: . amiodarone  200 mg Oral Daily  . Chlorhexidine Gluconate Cloth  6 each Topical Daily  . dexamethasone (DECADRON) injection  4 mg Intravenous Q12H  . furosemide  40 mg Intravenous BID  . heparin  5,000 Units Subcutaneous Q8H  . insulin aspart  0-9 Units Subcutaneous Q4H  . levETIRAcetam  500 mg Oral BID  . mouth rinse  15 mL Mouth Rinse BID  . pantoprazole  40 mg Oral Daily  . sodium chloride flush  10-40 mL Intracatheter Q12H   Continuous Infusions: . sodium chloride 10 mL/hr at 08/29/19 0300  . amiodarone Stopped (08/25/19 2208)    LOS: 15 days    Domenic Polite, MD [facility]

## 2019-09-01 DIAGNOSIS — Z9689 Presence of other specified functional implants: Secondary | ICD-10-CM

## 2019-09-01 DIAGNOSIS — Z515 Encounter for palliative care: Secondary | ICD-10-CM

## 2019-09-01 LAB — CBC
HCT: 33.4 % — ABNORMAL LOW (ref 39.0–52.0)
Hemoglobin: 11 g/dL — ABNORMAL LOW (ref 13.0–17.0)
MCH: 31.7 pg (ref 26.0–34.0)
MCHC: 32.9 g/dL (ref 30.0–36.0)
MCV: 96.3 fL (ref 80.0–100.0)
Platelets: 348 10*3/uL (ref 150–400)
RBC: 3.47 MIL/uL — ABNORMAL LOW (ref 4.22–5.81)
RDW: 14 % (ref 11.5–15.5)
WBC: 28 10*3/uL — ABNORMAL HIGH (ref 4.0–10.5)
nRBC: 0 % (ref 0.0–0.2)

## 2019-09-01 LAB — BASIC METABOLIC PANEL
Anion gap: 11 (ref 5–15)
BUN: 51 mg/dL — ABNORMAL HIGH (ref 8–23)
CO2: 31 mmol/L (ref 22–32)
Calcium: 8.7 mg/dL — ABNORMAL LOW (ref 8.9–10.3)
Chloride: 110 mmol/L (ref 98–111)
Creatinine, Ser: 2.15 mg/dL — ABNORMAL HIGH (ref 0.61–1.24)
GFR calc Af Amer: 36 mL/min — ABNORMAL LOW (ref >60)
GFR calc non Af Amer: 31 mL/min — ABNORMAL LOW (ref >60)
Glucose, Bld: 121 mg/dL — ABNORMAL HIGH (ref 70–99)
Potassium: 3.2 mmol/L — ABNORMAL LOW (ref 3.5–5.1)
Sodium: 152 mmol/L — ABNORMAL HIGH (ref 135–145)

## 2019-09-01 LAB — GLUCOSE, CAPILLARY
Glucose-Capillary: 107 mg/dL — ABNORMAL HIGH (ref 70–99)
Glucose-Capillary: 51 mg/dL — ABNORMAL LOW (ref 70–99)
Glucose-Capillary: 75 mg/dL (ref 70–99)
Glucose-Capillary: 88 mg/dL (ref 70–99)

## 2019-09-01 MED ORDER — AMIODARONE IV BOLUS ONLY 150 MG/100ML
150.0000 mg | Freq: Once | INTRAVENOUS | Status: AC
  Administered 2019-09-01: 150 mg via INTRAVENOUS
  Filled 2019-09-01: qty 100

## 2019-09-01 MED ORDER — HYDROMORPHONE HCL 1 MG/ML IJ SOLN
0.5000 mg | INTRAMUSCULAR | Status: AC | PRN
  Administered 2019-09-01 – 2019-09-02 (×7): 1 mg via INTRAVENOUS
  Filled 2019-09-01 (×7): qty 1

## 2019-09-01 MED ORDER — GLYCOPYRROLATE 0.2 MG/ML IJ SOLN
0.2000 mg | INTRAMUSCULAR | Status: DC | PRN
  Administered 2019-09-01 – 2019-09-02 (×5): 0.2 mg via INTRAVENOUS
  Filled 2019-09-01 (×6): qty 1

## 2019-09-01 MED ORDER — GLYCOPYRROLATE 1 MG PO TABS
1.0000 mg | ORAL_TABLET | ORAL | Status: DC | PRN
  Filled 2019-09-01: qty 1

## 2019-09-01 MED ORDER — GLYCOPYRROLATE 0.2 MG/ML IJ SOLN: 0.2000 mg | INTRAMUSCULAR | Status: DC | PRN

## 2019-09-01 MED ORDER — ONDANSETRON HCL 4 MG/2ML IJ SOLN: 4.0000 mg | Freq: Four times a day (QID) | INTRAMUSCULAR | Status: DC | PRN

## 2019-09-01 MED ORDER — DEXTROSE 50 % IV SOLN
INTRAVENOUS | Status: AC
  Administered 2019-09-01: 50 mL
  Filled 2019-09-01: qty 50

## 2019-09-01 MED ORDER — ACETAMINOPHEN 650 MG RE SUPP: 650.0000 mg | Freq: Four times a day (QID) | RECTAL | Status: DC | PRN

## 2019-09-01 MED ORDER — DEXTROSE 5 % IV SOLN: INTRAVENOUS | Status: AC

## 2019-09-01 MED ORDER — LORAZEPAM 2 MG/ML IJ SOLN
2.0000 mg | INTRAMUSCULAR | Status: AC | PRN
  Administered 2019-09-02 (×4): 2 mg via INTRAVENOUS
  Filled 2019-09-01 (×5): qty 1

## 2019-09-01 MED ORDER — POLYVINYL ALCOHOL 1.4 % OP SOLN
1.0000 [drp] | Freq: Four times a day (QID) | OPHTHALMIC | Status: DC | PRN
  Filled 2019-09-01: qty 15

## 2019-09-01 MED ORDER — SODIUM CHLORIDE 0.9 % IV SOLN
3.0000 g | Freq: Three times a day (TID) | INTRAVENOUS | Status: DC
  Administered 2019-09-01: 09:00:00 3 g via INTRAVENOUS
  Filled 2019-09-01: qty 8
  Filled 2019-09-01: qty 3

## 2019-09-01 MED ORDER — BIOTENE DRY MOUTH MT LIQD: 15.0000 mL | OROMUCOSAL | Status: DC | PRN

## 2019-09-01 MED ORDER — ACETAMINOPHEN 325 MG PO TABS: 650.0000 mg | ORAL_TABLET | Freq: Four times a day (QID) | ORAL | Status: DC | PRN

## 2019-09-01 MED ORDER — ONDANSETRON 4 MG PO TBDP: 4.0000 mg | ORAL_TABLET | Freq: Four times a day (QID) | ORAL | Status: DC | PRN

## 2019-09-01 MED ORDER — SODIUM CHLORIDE 0.9 % IV SOLN
250.0000 mg | Freq: Two times a day (BID) | INTRAVENOUS | Status: DC
  Administered 2019-09-01: 250 mg via INTRAVENOUS
  Filled 2019-09-01 (×3): qty 2.5

## 2019-09-01 NOTE — Progress Notes (Signed)
  Subjective: Patient with minimal response Breath sounds clear CT chest images reviewed- R lung with traumatic small   pneumatocele of RUL- doesnot need a tube. L chest with small pneumothorax No air leak from chest tubes Placed to water seal  Patient is now comfort care.  No further thoracic surgical input needed  Objective: Vital signs in last 24 hours: Temp:  [97.3 F (36.3 C)-100.1 F (37.8 C)] 98 F (36.7 C) (02/18 1133) Pulse Rate:  [43-103] 78 (02/18 1133) Cardiac Rhythm: Sinus tachycardia (02/18 0704) Resp:  [18-32] 24 (02/18 1133) BP: (106-176)/(52-161) 114/60 (02/18 1133) SpO2:  [95 %-100 %] 98 % (02/18 1133)  Hemodynamic parameters for last 24 hours:    Intake/Output from previous day: 02/17 0701 - 02/18 0700 In: 1577.5 [I.V.:1577.5] Out: 3124 [Urine:3050; Chest Tube:74] Intake/Output this shift: No intake/output data recorded.  EXAM  Minimal responsiveness Breath sounds equal No cardiac murmur  Lab Results: Recent Labs    08/31/19 0438 09/01/19 0400  WBC 17.6* 28.0*  HGB 11.0* 11.0*  HCT 34.8* 33.4*  PLT 352 348   BMET:  Recent Labs    08/31/19 0438 09/01/19 0400  NA 152* 152*  K 3.8 3.2*  CL 110 110  CO2 34* 31  GLUCOSE 128* 121*  BUN 66* 51*  CREATININE 2.15* 2.15*  CALCIUM 8.8* 8.7*    PT/INR: No results for input(s): LABPROT, INR in the last 72 hours. ABG    Component Value Date/Time   PHART 7.432 08/24/2019 1224   HCO3 34.4 (H) 08/24/2019 1224   TCO2 36 (H) 08/24/2019 1224   ACIDBASEDEF 5.0 (H) 08/17/2019 2152   O2SAT 95.0 08/24/2019 1224   CBG (last 3)  Recent Labs    09/01/19 0738 09/01/19 1133 09/01/19 1136  GLUCAP 107* 42* 51*    Assessment/Plan: S/P PEA arrest, L pntx and chest tubes x2 Surgery not planned Will see if needed  LOS: 16 days    Kathlee Nations Trigt III 09/01/2019

## 2019-09-01 NOTE — Progress Notes (Signed)
Pt mildly agitated this pm 2/2 to cough, RR 30s. Given PRN robinul and dilaudid. Pt swats nurse away at any attempts to assess. Alert to self only. Currently sleeping HR 120s RR 16. Will continue to monitor.

## 2019-09-01 NOTE — Progress Notes (Signed)
Per Raymon Mutton, PA leave chest tubes until discharge.

## 2019-09-01 NOTE — Progress Notes (Signed)
Pharmacy Antibiotic Note  Philip Wells is a 64 y.o. male admitted on 08/28/2019 with pneumonia.  Pharmacy has been consulted for unasyn dosing.  Plan: Unasyn 3gm IV q8 hours F/u renal function, cultures and clinical course  Height: 5\' 11"  (180.3 cm) Weight: 139 lb 1.8 oz (63.1 kg) IBW/kg (Calculated) : 75.3  Temp (24hrs), Avg:98.1 F (36.7 C), Min:97.3 F (36.3 C), Max:100.1 F (37.8 C)  Recent Labs  Lab 08/28/19 0507 08/28/19 0507 08/29/19 0348 08/29/19 1650 08/30/19 0929 08/31/19 0438 09/01/19 0400  WBC 24.5*  --  20.6*  --  17.7* 17.6* 28.0*  CREATININE 3.53*   < > 3.01* 2.74* 2.57* 2.15* 2.15*  VANCOTROUGH 20  --   --   --   --   --   --    < > = values in this interval not displayed.    Estimated Creatinine Clearance: 31 mL/min (A) (by C-G formula based on SCr of 2.15 mg/dL (H)).    No Known Allergies  Thank you for allowing pharmacy to be a part of this patient's care.  08/22/2019 [place] 09/01/2019 7:08 AM

## 2019-09-01 NOTE — TOC Initial Note (Signed)
Transition of Care (TOC) - Initial/Assessment Note  Marvetta Gibbons RN, BSN Transitions of Care Unit 4E- RN Case Manager [phone]   Patient Details  Name: Philip Wells MRN: [id number] Date of Birth: [birth year]  Transition of Care [facility]) CM/SW Contact:    Dawayne Patricia, RN Phone Number: 09/01/2019, 3:24 PM  Clinical Narrative:                 Pt admitted s/p cardiac arrest, referral received today for [facility])- per MD family has chosen to move to comfort care and per choice would like [employer]- call made to Audrea Muscat with [facility] for Residential Hospice referral- per [place] does not have any beds available today- she will review chart for hospice referral and reassess bed availability tomorrow.    Expected Discharge Plan: Hospice Medical Facility Barriers to Discharge: Other (comment)(No Hospice bed today)   Patient Goals and CMS Choice Patient states their goals for this hospitalization and ongoing recovery are:: Comfort Care per family CMS Medicare.gov Compare Post Acute Care list provided to:: Patient Represenative (must comment)(daughter) Choice offered to / list presented to : Adult Children  Expected Discharge Plan and Services Expected Discharge Plan: [place]     Post Acute Care Choice: Hospice                                        Prior Living Arrangements/Services   Lives with:: Self Patient language and need for interpreter reviewed:: Yes              Criminal Activity/Legal Involvement Pertinent to Current Situation/Hospitalization: No - Comment as needed  Activities of Daily Living Home Assistive Devices/Equipment: None ADL Screening (condition at time of admission) Patient's cognitive ability adequate to safely complete daily activities?: Yes Is the patient deaf or have difficulty hearing?: No Does the patient have difficulty seeing, even when wearing  glasses/contacts?: No Does the patient have difficulty concentrating, remembering, or making decisions?: Yes Patient able to express need for assistance with ADLs?: Yes Does the patient have difficulty dressing or bathing?: Yes Independently performs ADLs?: No Communication: Independent Dressing (OT): Needs assistance Is this a change from baseline?: Change from baseline, expected to last >3 days Grooming: Needs assistance Is this a change from baseline?: Change from baseline, expected to last >3 days Feeding: Independent Bathing: Needs assistance Is this a change from baseline?: Change from baseline, expected to last >3 days Toileting: Needs assistance Is this a change from baseline?: Change from baseline, expected to last >3days In/Out Bed: Needs assistance, Dependent Is this a change from baseline?: Change from baseline, expected to last >3 days Walks in Home: Needs assistance, Dependent Is this a change from baseline?: Change from baseline, expected to last >3 days Does the patient have difficulty walking or climbing stairs?: Yes Weakness of Legs: Both Weakness of Arms/Hands: Both  Permission Sought/Granted                  Emotional Assessment Appearance:: Appears older than stated age Attitude/Demeanor/Rapport: Unable to Assess Affect (typically observed): Unable to Assess     Psych Involvement: No (comment)  Admission diagnosis:  Cardiac arrest [facility]) [I46.9] Patient Active Problem List   Diagnosis Date Noted  . Pressure injury of skin 08/23/2019  . Malnutrition of moderate degree 08/19/2019  . AKI (acute kidney injury) ([place])   . Encounter  for chest tube placement   . History of placement of chest tube   . Septic shock (HCC)   . Respiratory failure (HCC)   . Pneumothorax   . Cardiac arrest (HCC) 08-22-19  . Hydropneumothorax    PCP:  Patient, No Pcp Per Pharmacy:   [facility] [phone] - [place] Otto, [place] - [address] AT [facility] [address]  [address] [place] [address] Phone: [phone] Fax: [phone]     Social Determinants of Health (SDOH) Interventions    Readmission Risk Interventions No flowsheet data found.

## 2019-09-01 NOTE — Progress Notes (Signed)
After the pt got back from his CT scan, his heart rate jumped up to around 130s-140s with an irregular rhythm.  The pt, with a history of AFib, refused to take his amiodarone medication in the day time.  Paged the on call hospitalist, who ordered a one time Amiodarone IV bolus only 150 mg/100 mL over 10 minutes.  The bolus of Amiodarone was given.  At completion, the heart rate was in the 120s.  About 20 minutes after the bolus was complete, the pt's heart rate dropped to high 100 - mid 110s.  Will continue to monitor.  Harriet Masson, RN

## 2019-09-01 NOTE — Progress Notes (Signed)
Dr. Donata Clay at bedside. Placed both chest tubes to water seal.

## 2019-09-01 NOTE — Plan of Care (Signed)
Continue to monitor

## 2019-09-01 NOTE — Progress Notes (Signed)
   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  He does have biphasic flow in the ulnar artery and palmar arch.  Really nothing to do from a vascular standpoint.  I if he had problems healing a thumb amputation and we could consider arteriography if his function improves.  Vascular surgery will be available as needed.  I have arranged follow-up with me in 6 weeks.   SUBJECTIVE:   No change in mental status.  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  LABS:   Lab Results  Component Value Date   WBC 28.0 (H) 09/01/2019   HGB 11.0 (L) 09/01/2019   HCT 33.4 (L) 09/01/2019   MCV 96.3 09/01/2019   PLT 348 09/01/2019   Lab Results  Component Value Date   CREATININE 2.15 (H) 09/01/2019   Lab Results  Component Value Date   INR 1.0 08/26/2019   CBG (last 3)  Recent Labs    09/01/19 0009 09/01/19 0427 09/01/19 0738  GLUCAP 75 88 107*    PROBLEM LIST:    Active Problems:   Cardiac arrest (HCC)   Hydropneumothorax   Pneumothorax   AKI (acute kidney injury) (HCC)   Encounter for chest tube placement   History of placement of chest tube   Septic shock (HCC)   Respiratory failure (HCC)   Malnutrition of moderate degree   Pressure injury of skin   CURRENT MEDS:   . amiodarone  200 mg Oral Daily  . Chlorhexidine Gluconate Cloth  6 each Topical Daily  . dexamethasone (DECADRON) injection  4 mg Intravenous Q24H  . furosemide  40 mg Intravenous BID  . heparin  5,000 Units Subcutaneous Q8H  . insulin aspart  0-9 Units Subcutaneous Q4H  . levETIRAcetam  250 mg Oral BID  . mouth  rinse  15 mL Mouth Rinse BID  . pantoprazole  40 mg Oral Daily  . sodium chloride flush  10-40 mL Intracatheter Q12H    Waverly Ferrari Office: [phone] 09/01/2019

## 2019-09-01 NOTE — Progress Notes (Signed)
SLP Cancellation Note  Patient Details Name: Lorn Butcher MRN: [id number] DOB: 03-02-1956   Cancelled treatment:       Reason Eval/Treat Not Completed: Other (comment) Patient comfort care at this time, SLP orders discontinued.  Shella Spearing, M.Ed., CCC-SLP Speech Therapy Acute Rehabilitation [phone]: Acute Rehab office [phone] - pager    Shella Spearing 09/01/2019, 2:34 PM

## 2019-09-01 NOTE — Progress Notes (Signed)
NAME:  Philip Wells, MRN:  [id number], DOB:  10/26/1955, LOS: 64 ADMISSION DATE:  08/27/2019, CONSULTATION DATE:  08/27/2019 REFERRING MD:  ED - [facility], CHIEF COMPLAINT:  Status post cardiac arrest.   Brief History   [name] with PEA cardiac arrest and subsequent distributive shock, presumably due to sepsis, AFwRVR.  STEMI ruled out. [name].    Past Medical History  ETOH use- wine  Significant Hospital Events   2/02 admitted with undifferentiated shock following cardiac arrest 2/04 continued pressor requirements.  Completed 36 degree TTM 2/06 more awake today.  Weaning pressor requirements. 2/07 Off pressors 2/08 extubated  2/10 Reintubated, FOB with tooth removal. Diltiazem added for AF rate control. R thora 2/12 Weaning on 10/5 2/13 Apneic during wean, back on full support. Minimal chest tube output overnight  2/14 transfer out of ICU 2/15 small increase in left PTX, chest tube placed back to suction.  R thora by IR   Consults:  Cardiology - Patient R/O for STEMI  Procedures:  ETT 2/2 >> 2/8, 2/10 > 2/13 Foley 2/2 >> 2/12 L [place] TLC 2/5 >>  L CT 2/2 >>  L CT #2 2/3 >> L Radial Aline 2/2 >> 2/5 08/29/2019 right thoracentesis per interventional radiology with 200 cc fluid obtained  Significant Diagnostic Tests:  CT Head 2/2 >> severe atrophy but no hemorrhage  Renal US >> increased echogenicity consistent with medical renal disease ECHO 2/3 >> normal EF, Grade II diastolic dysfunction  EEG 2/4 >> diffuse encephalopathy but no seizures CXR 2/17 >  Moderate left pneumothorax, increased in size since prior exam. Unchanged position of 2 left-sided pleural pigtail catheters.  Micro Data:  COVID 2/2 >> negatve Flu A/B 2/2 >> negative  BCx2 2/3 >> NG Tracheal Aspirate 2/2 >> moderate serratia marcescens, S-ceftriaxone, cefepime BAL 2/10 >> GS with rare Gram - rods, 10,000 Candida Albicans  R Pleural Fluid 2/10 > ngtd   Antimicrobials:    Vanco 2/2 >> 2/5  Cefepime 2/2/ >> 2/9  Vanc 2/12> 2/14 Zosyn 2/12> 2/14  Interim history/subjective:   Somewhat encephalopathic this morning.  He will open his eyes to voice.  Moans a little bit.  Says that he is in a little bit of pain.  Objective   Blood pressure (!) 106/52, pulse 88, temperature 98 F (36.7 C), temperature source Oral, resp. rate (!) 22, height _0  (1.803 m), weight 63.1 kg, SpO2 100 %.        Intake/Output Summary (Last 24 hours) at 09/01/2019 1011 Last data filed at 09/01/2019 0430 Gross per 24 hour  Intake 1567.47 ml  Output 3124 ml  Net -1556.53 ml   Filed Weights   08/27/19 0400 08/28/19 0500 08/29/19 0326  Weight: 70.7 kg 70.1 kg 63.1 kg    Examination:  General: Chronically ill-appearing, deconditioned cachectic male HEENT: NCAT, tracking appropriately Neuro: Alert to voice, moves all 4 extremities CV: Regular rate rhythm, S1-S2 PULM: 2 chest tubes in place bilateral breath sounds GI: Soft nontender nondistended Extremities: No significant edema, cachectic Skin: No rashes   Resolved Hospital Problem list   Cardiac arrest  Shock - suspect septic  Hypoglycemia  Sepsis secondary to Serratia PNA   Assessment & Plan:   Acute Hypoxemic Respiratory Failure  With his encephalopathy I am concerned potentially for his inability to protect his airway. -Failed extubation 2/8, aspirated 2/9 with swallow evaluation  -Had FOB 2/10 for tooth extraction.  -Extubated 2/13 Plan: Continue to wean  FiO2 as possible Long discussion with sister via phone. At this time CODE STATUS changed to full DNR. She agrees and understands with the communicating cavity and recurrent air leak within the chest not candidate for surgical intervention that reintubation at this time for a third time would not be beneficial for him. She is [place] in the bed the rest of the family members. They are considering discharge with home hospice  Left Hydropneumothorax - slight  increase in PTX 2/15. -Moderate left pneumothorax, increased in size since prior exam with 2 chest tube in place as seen on CXR 2/17 Plan: Bilateral chest tubes in place 1 to suction.  Right Pleural Effusion s/p thoracentesis 2/10 and 2/15 (NGTD). Plan: Follow on chest x-ray if needed at this time    Sepsis secondary to Serratia PNA  - s/p course cefepime (end date 2/9).  Had worsening leukocytosis following cessation of cefepime; however, patient is also on decadron (47m q6hr started 2/10). -BAL 2/10 with GS revealing rare Gram - rods, candida (likely colonization)  PCT 6.04, but in setting of impaired renal function this is not a great marker Plan: Off antibiotics. Continue to observe  I spoke with the patient's primary hospitalist Dr. JBroadus John  Hopefully we can coordinate with potential discharge to home hospice.  We will need to figure out how to be able to supply a suction device for the chest tubes.  Palliative care may be able to help with this.  BMcMullinPulmonary Critical Care 09/01/2019 10:12 AM

## 2019-09-01 NOTE — Progress Notes (Addendum)
PROGRESS NOTE    Philip Wells  HYQ:[id number] DOB: 04-19-1956 DOA: 08/17/2019 PCP: Patient, No Pcp Per   Brief Narrative:  The patient is a 64 year old male with a PEA cardiac arrest and subsequent distributive shock presumably due to sepsis and A. fib with RVR.  He had a STEMI ruled out and he was also found to have Serratia pneumonia. Treated with targeted temperature management on 08/18/2019 and continued pressor requirement.  Weaned off pressors on 2/8,  on 2/8 he was extubated but then on 2/10 he was reintubated with FOB tooth removal.  He still had atrial fibrillation so diltiazem was added.  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 fib RVR -Unlikely to be PE or ACS with relatively normal TTE, also preserved EF and normal wall motion -Initially had shock requiring vasopressors  -BP stable     Sepsis secondary to Serratia PNA  -completed course cefepime on 2/9  Acute Hypoxemic Respiratory Failure requiring re-intubation status post 2 chest tubes Right pleural effusion status post thoracentesis Left hydropneumothorax Aspiration pneumonia -Failed extubation 2/8, aspirated 2/9 with swallow evaluation > worsening O2 needs after, re-intubated 2/10.   FOB 2/10 for tooth extraction.  -Extubated 2/13  -Underwent thoracentesis 2/15 pleural fluid culture negative, fluid analysis consistent with transudate -Subsequently has been on IV Lasix -Continues to have 2 chest tubes on waterseal, 2/17 pneumothorax is worse, CVTS consulted, not a candidate for VATS  given anoxic encephalopathy -Follow-up CT chest 2/17 with large right upper lobe cavity with direct communication with the right upper bronchus, aspiration pneumonia and mucous, white count has bumped up to 20 8K this morning, will start IV Unasyn, n.p.o., SLP evaluation -Prognosis remains very poor, explained to Ortencia Kick that intubation under this circumstance would be counterproductive -Remains at high risk of decompensation, death-this was explained to Ortencia Kick again today  Acute metabolic encephalopathy with behavioral disturbances -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, CT head notes baseline cerebral atrophy  -In addition likely has delirium from prolonged hospitalization -Continue delirium precautions -Continue Keppra twice daily will decrease dose -Unclear why he is getting Decadron, started in the ICU on 2/10, dose decreased, will stop this today  AKI, slowly improving  Hypokalemia Hypernatremia -Patient's BUN/creatinine is trending down and went from 104/3.53 and today /2.15 -Avoid further nephrotoxic medications, contrast dyes, hypotension and renally adjust medications -Hold Lasix today  Hypomagnesemia -replaced  Atrial Fibrillation, rate controlled at present -continue Amiodarone  Mild Transaminitis  -Suspect multifactorial in setting of baseline ETOH use, shock from cardiac arrest  -Trend  Normocytic Anemia, stable  -Stable  Severe protein calorie malnutrition -SLP evaluated and placed the patient on dysphagia 3 diet with nectar thick liquids -Severe hypoalbuminemia, albumin is 1.5 -Continue D5 water today, n.p.o. now given aspiration  Dry gangrene of the left thumb -Does not have any radial pulse on the left -Appreciate vascular and Ortho input -Recommended to continue to monitor at this time, not appropriate for arteriogram at this time given advanced CKD  DVT prophylaxis: Subcu heparin Code Status: Patient is a partial code and no CPR should be  initiated no defibrillator and no  ACLS medications; intubation and PPV is okay, called and updated Hassan Rowan regarding ongoing lip decline and strongly recommended consideration of DNR, she will discuss with her family Family Communication: No family present at bedside, called and updated Sister Hassan Rowan 2/18 Disposition Plan: High risk of decompensation, death with worsening aspiration, pneumothorax, encephalopathy, post PEA arrest  Consultants:   PCCM Transfer/Pulmonary  Cardiology will rule the patient out for STEMI  Palliative care  Orthopedic hand surgery  Vascular surgery  Procedures:  ETT 2/2 >> 2/8, 2/10 > 2/13 Foley 2/2 >>  L [place] TLC 2/5 >>  L CT 2/2 >>  L CT #2 2/3 >> L Radial Aline 2/2   Significant Diagnostic Tests:   CT Head 2/2 >> severe atrophy but no hemorrhage   Renal US >> increased echogenicity consistent with medical renal disease  ECHO 2/3 >> normal EF, Grade II diastolic dysfunction   EEG 2/4 >> diffuse encephalopathy but no seizures  R Thoracentesis 2/10 >>  Repeat right thoracentesis on 08/29/2019    Micro Data:  COVID 2/2 >> negatve Flu A/B 2/2 >> negative  BCx2 2/3 >> NG Tracheal Aspirate 2/2 >> moderate serratia marcescens, S-ceftriaxone, cefepime BAL 2/10 >> GS with rare Gram - rods, 10,000 Candida Albicans  R Pleural Fluid 2/10 > ngtd     Antimicrobials:  Anti-infectives (From admission, onward)   Start     Dose/Rate Route Frequency Ordered Stop   09/01/19 0800  Ampicillin-Sulbactam (UNASYN) 3 g in sodium chloride 0.9 % 100 mL IVPB     3 g 200 mL/hr over 30 Minutes Intravenous Every 8 hours 09/01/19 0708     08/26/19 1400  vancomycin (VANCOREADY) IVPB 1500 mg/300 mL     1,500 mg 150 mL/hr over 120 Minutes Intravenous  Once 08/26/19 1145 08/26/19 1623   08/26/19 1230  piperacillin-tazobactam (ZOSYN) IVPB 2.25 g  Status:  Discontinued     2.25 g 100 mL/hr over 30 Minutes Intravenous Every 8 hours 08/26/19 1145 08/28/19 1433    08/26/19 1145  vancomycin variable dose per unstable renal function (pharmacist dosing)  Status:  Discontinued      Does not apply See admin instructions 08/26/19 1145 08/28/19 1433   08/23/19 1000  ceFEPIme (MAXIPIME) 2 g in sodium chloride 0.9 % 100 mL IVPB     2 g 200 mL/hr over 30 Minutes Intravenous Every 24 hours 08/22/19 1437 08/23/19 1250   08/20/19 1000  ceFEPIme (MAXIPIME) 2 g in sodium chloride 0.9 % 100 mL IVPB  Status:  Discontinued     2 g 200 mL/hr over 30 Minutes Intravenous Every 12 hours 08/20/19 0914 08/22/19 1437   08/19/19 0930  vancomycin (VANCOREADY) IVPB 1250 mg/250 mL     1,250 mg 166.7 mL/hr over 90 Minutes Intravenous  Once 08/19/19 0904 08/19/19 1154   08/18/19 0045  vancomycin (VANCOREADY) IVPB 1250 mg/250 mL     1,250 mg 166.7 mL/hr over 90 Minutes Intravenous  Once 08/18/19 0031 08/18/19 0258   08/27/2019 2115  ceFEPIme (MAXIPIME) 2 g in sodium chloride 0.9 % 100 mL IVPB  Status:  Discontinued     2 g 200 mL/hr over 30 Minutes Intravenous Every 24 hours 08/25/2019 2114 08/20/19 0914   09/01/2019 2115  vancomycin (VANCOCIN) IVPB 1000 mg/200 mL premix     1,000 mg 200 mL/hr over 60 Minutes Intravenous  Once 09/05/2019 2114 08/17/19 0031   08/29/2019 2114  vancomycin variable dose per unstable renal function (pharmacist dosing)  Status:  Discontinued  Does not apply See admin instructions 09/06/2019 2114 08/20/19 0827     Subjective: -Mental status is worse, unable to eat anything, worsening tachycardia and tachypnea  Objective: Vitals:   09/01/19 0742 09/01/19 0800 09/01/19 0806 09/01/19 0858  BP: (!) 141/113  (!) 106/56 (!) 106/52  Pulse: 76 (!) 102 88   Resp: (!) 26 (!) 25 (!) 21 (!) 22  Temp: 98 F (36.7 C)     TempSrc: Oral     SpO2: 100% 100% 100%   Weight:      Height:        Intake/Output Summary (Last 24 hours) at 09/01/2019 1003 Last data filed at 09/01/2019 0430 Gross per 24 hour  Intake 1567.47 ml  Output 3124 ml  Net -1556.53 ml   Filed  Weights   08/27/19 0400 08/28/19 0500 08/29/19 0326  Weight: 70.7 kg 70.1 kg 63.1 kg   Examination: Physical Exam: Gen: Chronically ill-appearing African-American male laying in bed, uncomfortable, oriented to self only HEENT: Poor oral and dental hygiene, no JVD Lungs: Scattered rhonchi bilaterally, 2 chest tubes noted CVS: S1-S2, regular rate rhythm Abd: soft, Non tender, non distended, BS present Extremities: No edema, dry gangrene of his left index finger Skin: As above Psychiatric: Impaired judgment and insight.  Somewhat agitated mood and appropriate affect.   Data Reviewed: I have personally reviewed following labs and imaging studies  CBC: Recent Labs  Lab 08/26/19 0341 08/26/19 0341 08/27/19 0455 08/27/19 0455 08/28/19 0507 08/29/19 0348 08/30/19 0929 08/31/19 0438 09/01/19 0400  WBC 28.7*   < > 22.4*   < > 24.5* 20.6* 17.7* 17.6* 28.0*  NEUTROABS 27.0*  --  21.0*  --  22.8*  --  15.7* 16.2*  --   HGB 10.1*   < > 10.0*   < > 10.7* 10.9* 10.1* 11.0* 11.0*  HCT 29.9*   < > 28.7*   < > 32.0* 33.1* 31.1* 34.8* 33.4*  MCV 93.7   < > 90.5   < > 93.8 95.1 97.5 98.0 96.3  PLT 197   < > 243   < > 286 314 312 352 348   < > = values in this interval not displayed.   Basic Metabolic Panel: Recent Labs  Lab 08/25/19 2211 08/26/19 0341 08/27/19 0455 08/28/19 0507 08/29/19 0348 08/29/19 1650 08/30/19 0929 08/31/19 0438 09/01/19 0400  NA 144   < > 144   < > 149* 150* 153* 152* 152*  K 3.5   < > 3.2*   < > 3.3* 3.2* 3.3* 3.8 3.2*  CL 102   < > 100   < > 104 104 109 110 110  CO2 30   < > 32   < > 32 33* 33* 34* 31  GLUCOSE 174*   < > 143*   < > 108* 102* 100* 128* 121*  BUN 103*   < > 111*   < > 90* 82* 79* 66* 51*  CREATININE 4.24*   < > 4.05*   < > 3.01* 2.74* 2.57* 2.15* 2.15*  CALCIUM 8.4*   < > 8.4*   < > 8.5* 8.5* 8.5* 8.8* 8.7*  MG 1.7  --  1.6*  --  1.4*  --  1.2* 1.8  --   PHOS  --   --   --   --   --   --  4.2 4.7*  --    < > = values in this interval not  displayed.   GFR: Estimated  Creatinine Clearance: 31 mL/min (A) (by C-G formula based on SCr of 2.15 mg/dL (H)). Liver Function Tests: Recent Labs  Lab 08/28/19 0507 08/29/19 0348 08/29/19 1650 08/30/19 0929 08/31/19 0438  AST 129* 92* 77* 61* 49*  ALT 112* 96* 88* 74* 68*  ALKPHOS 135* 112 103 102 99  BILITOT 0.9 0.8 1.0 1.1 0.9  PROT 5.3* 5.1* 5.1* 5.4* 5.8*  ALBUMIN 1.5* 1.5* 1.6* 1.6* 1.8*   No results for input(s): LIPASE, AMYLASE in the last 168 hours. No results for input(s): AMMONIA in the last 168 hours. Coagulation Profile: Recent Labs  Lab 08/26/19 1005  INR 1.0   Cardiac Enzymes: No results for input(s): CKTOTAL, CKMB, CKMBINDEX, TROPONINI in the last 168 hours. BNP (last 3 results) No results for input(s): PROBNP in the last 8760 hours. HbA1C: No results for input(s): HGBA1C in the last 72 hours. CBG: Recent Labs  Lab 08/31/19 2030 08/31/19 2143 09/01/19 0009 09/01/19 0427 09/01/19 0738  GLUCAP 56* 127* 75 88 107*   Lipid Profile: No results for input(s): CHOL, HDL, LDLCALC, TRIG, CHOLHDL, LDLDIRECT in the last 72 hours. Thyroid Function Tests: No results for input(s): TSH, T4TOTAL, FREET4, T3FREE, THYROIDAB in the last 72 hours. Anemia Panel: No results for input(s): VITAMINB12, FOLATE, FERRITIN, TIBC, IRON, RETICCTPCT in the last 72 hours. Sepsis Labs: Recent Labs  Lab 08/26/19 1005  PROCALCITON 6.04    Recent Results (from the past 240 hour(s))  Body fluid culture (includes gram stain)     Status: None   Collection Time: 08/24/19  9:40 AM   Specimen: Pleural Fluid  Result Value Ref Range Status   Specimen Description PLEURAL RIGHT  Final   Special Requests NONE  Final   Gram Stain   Final    RARE WBC PRESENT, PREDOMINANTLY MONONUCLEAR NO ORGANISMS SEEN    Culture   Final    NO GROWTH 3 DAYS Performed at [facility], [place] [address] [place], [place] [address]    Report Status 08/27/2019 FINAL  Final  Culture,  bal-quantitative     Status: Abnormal   Collection Time: 08/24/19 10:39 AM   Specimen: Bronchoalveolar Lavage; Respiratory  Result Value Ref Range Status   Specimen Description BRONCHIAL ALVEOLAR LAVAGE RIGHT LOWER LUNG  Final   Special Requests NONE  Final   Gram Stain   Final    RARE WBC PRESENT,BOTH PMN AND MONONUCLEAR RARE GRAM NEGATIVE RODS Performed at [facility], [place] [address] [place], [place] [address]    Culture 10,000 COLONIES/mL CANDIDA ALBICANS (A)  Final   Report Status 08/26/2019 FINAL  Final  Gram stain     Status: None   Collection Time: 08/29/19 11:47 AM   Specimen: Lung, Right; Pleural Fluid  Result Value Ref Range Status   Specimen Description PLEURAL RIGHT  Final   Special Requests NONE  Final   Gram Stain   Final    RARE WBC PRESENT,BOTH PMN AND MONONUCLEAR NO ORGANISMS SEEN Performed at [facility], [place] [address], [place], [place] [address]    Report Status 08/29/2019 FINAL  Final  Culture, body fluid-bottle     Status: None (Preliminary result)   Collection Time: 08/29/19 11:47 AM   Specimen: Pleura  Result Value Ref Range Status   Specimen Description PLEURAL RIGHT  Final   Special Requests NONE  Final   Culture   Final    NO GROWTH 3 DAYS Performed at [facility] [place],  [place] [address]    Report Status PENDING  Incomplete     RN Pressure Injury Documentation: Pressure Injury 08/23/19 Head Left Stage 1 -  Intact skin with non-blanchable redness of a localized area usually over a bony prominence. Small Pressure Injury from Forehead Probe (Active)  08/23/19 0800  Location: Head  Location Orientation: Left  Staging: Stage 1 -  Intact skin with non-blanchable redness of a localized area usually over a bony prominence.  Wound Description (Comments): Small Pressure Injury from Forehead Probe  Present on Admission: No     Pressure Injury 08/24/19 Lip Upper Stage 2 -  Partial thickness loss of dermis  presenting as a shallow open injury with a red, pink wound bed without slough. pink with white/yellow center in middle (Active)  08/24/19 2000  Location: Lip  Location Orientation: Upper  Staging: Stage 2 -  Partial thickness loss of dermis presenting as a shallow open injury with a red, pink wound bed without slough.  Wound Description (Comments): pink with white/yellow center in middle  Present on Admission: No     Pressure Injury 08/27/19 Face Left Stage 3 -  Full thickness tissue loss. Subcutaneous fat may be visible but bone, tendon or muscle are NOT exposed. (Active)  08/27/19 1600  Location: Face  Location Orientation: Left  Staging: Stage 3 -  Full thickness tissue loss. Subcutaneous fat may be visible but bone, tendon or muscle are NOT exposed.  Wound Description (Comments):   Present on Admission:      Estimated body mass index is 19.4 kg/m as calculated from the following:   Height as of this encounter: _0  (1.803 m).   Weight as of this encounter: 63.1 kg.  Malnutrition Type:  Nutrition Problem: Moderate Malnutrition Etiology: chronic illness, social / environmental circumstances   Malnutrition Characteristics:  Signs/Symptoms: mild fat depletion, moderate muscle depletion   Nutrition Interventions:  Interventions: Magic cup, Hormel Shake   Radiology Studies: CT chest without contrast  Result Date: 08/31/2019 CLINICAL DATA:  Pneumothorax. EXAM: CT CHEST WITHOUT CONTRAST TECHNIQUE: Multidetector CT imaging of the chest was performed following the standard protocol without IV contrast. COMPARISON:  Prior radiographs most recently earlier this day. FINDINGS: Cardiovascular: Left subclavian central line with tip in the upper SVC. Aortic atherosclerosis without aortic aneurysm. Heart is normal in size. Small pericardial effusion measures up to 12 mm in depth adjacent to the right ventricle. Mediastinum/Nodes: Small mediastinal lymph nodes not enlarged by size  criteria. Limited evaluation for hilar adenopathy given lack of IV contrast. The esophagus is decompressed. Lungs/Pleura: 2 left pigtail catheter is in place, more anterior catheter courses anteriorly with tip coiled medially adjacent to the mediastinum. More posterior chest tube courses posteriorly with tip abutting the interlobar fissure in the left lung. Small left hydropneumothorax with layering pleural fluid. Pleural fluid appears simple density. There is adjacent compressive atelectasis in the left upper and lower lobes. Right pleural effusion is small to moderate partially loculated and tracking laterally. Dense airspace consolidation throughout the right lower lobe which appears slightly nodular extends to the hilum. Thick-walled irregularly shaped cavitary lesion in the right upper lobe measures 2.8 x 4.3 x 2.1 cm with surrounding spiculation, additional irregular air-filled projections extend superiorly. Direct communication to the right upper lobe bronchus, series 9, image 51. Spiculations extend to the pleural surface laterally. Cavitary lesion extends to the minor fissure. There is no right pneumothorax. Mild emphysema. Retained mucus within the trachea, right mainstem bronchus and possibly filling segmental bronchi in  the right lower lobe. Upper Abdomen: Possible small volume ascites in the right upper quadrant. Dense contrast in the colon from prior barium swallow. Musculoskeletal: There are no acute or suspicious osseous abnormalities. IMPRESSION: 1. Thick-walled 4.3 cm cavitary lesion in the right upper lobe with surrounding spiculation and direct communication to the right upper lobe bronchus. Findings may represent cavitary pneumonia, neoplasm, or less likely scarring from sequela of prior chest tube placement. 2. Small to moderate partially loculated right pleural effusion. 3. Small left hydropneumothorax with 2 chest tubes in place. 4. Dense nodular airspace consolidations throughout the right  lower lobe. Differential consideration includes pneumonia or neoplasm. 5. Retained mucus in the trachea, right mainstem bronchus and possibly filling segmental bronchi in the right lower lobe. 6. Small pericardial effusion. Aortic Atherosclerosis (ICD10-I70.0) and Emphysema (ICD10-J43.9). Electronically Signed   By: Keith Rake M.D.   On: 08/31/2019 23:50   DG Chest Port 1 View  Result Date: 08/31/2019 CLINICAL DATA:  Pneumothorax EXAM: PORTABLE CHEST 1 VIEW COMPARISON:  Chest radiograph from the prior day. FINDINGS: To left-side pleural pigtail catheters are unchanged in position. A moderate left pneumothorax is increased in size since prior exam. The heart size is unchanged in size. Mild bibasilar atelectasis is not significantly changed. Trace bilateral pleural effusions may contribute. There is pleural thickening along the right lung apex. There is no right pneumothorax. IMPRESSION: 1. Moderate left pneumothorax, increased in size since prior exam. Unchanged position of 2 left-sided pleural pigtail catheters. Electronically Signed   By: Zerita Boers M.D.   On: 08/31/2019 09:07   VAS [place] UPPER EXTREMITY ARTERIAL DUPLEX  Result Date: 08/30/2019 UPPER EXTREMITY DUPLEX STUDY Indications: Patient complains of Ischemic thumb.  Comparison Study: No prior studies. Performing Technologist: Oliver Hum RVT  Examination Guidelines: A complete evaluation includes B-mode imaging, spectral Doppler, color Doppler, and power Doppler as needed of all accessible portions of each vessel. Bilateral testing is considered an integral part of a complete examination. Limited examinations for reoccurring indications may be performed as noted.  Left Doppler Findings: +---------------+----------+--------+--------+----------------------------+ Site           PSV (cm/s)WaveformStenosisComments                     +---------------+----------+--------+--------+----------------------------+ Subclavian Prox73                                                      +---------------+----------+--------+--------+----------------------------+ Axillary       97.00                                                  +---------------+----------+--------+--------+----------------------------+ Brachial Prox  74                                                     +---------------+----------+--------+--------+----------------------------+ Brachial Dist  117                                                    +---------------+----------+--------+--------+----------------------------+  Radial Prox    135                                                    +---------------+----------+--------+--------+----------------------------+ Radial Mid     57                                                     +---------------+----------+--------+--------+----------------------------+ Radial Dist    14                        Occlusion noted in the wrist +---------------+----------+--------+--------+----------------------------+ Ulnar Prox     116                                                    +---------------+----------+--------+--------+----------------------------+ Ulnar Mid      81                                                     +---------------+----------+--------+--------+----------------------------+ Ulnar Dist     93                                                     +---------------+----------+--------+--------+----------------------------+ The brachial artery bifurcates into the radial and ulnar arteries in the mid upper arm.  Summary:  Left: Obstruction noted in the distal radial artery. *See table(s) above for measurements and observations. Electronically signed by Deitra Mayo MD on 08/30/2019 at 4:11:30 PM.    Final    Scheduled Meds: . amiodarone  200 mg Oral Daily  . Chlorhexidine Gluconate Cloth  6 each Topical Daily  . dexamethasone (DECADRON) injection  4 mg Intravenous  Q24H  . furosemide  40 mg Intravenous BID  . heparin  5,000 Units Subcutaneous Q8H  . insulin aspart  0-9 Units Subcutaneous Q4H  . mouth rinse  15 mL Mouth Rinse BID  . pantoprazole  40 mg Oral Daily  . sodium chloride flush  10-40 mL Intracatheter Q12H   Continuous Infusions: . sodium chloride 10 mL/hr at 08/29/19 0300  . ampicillin-sulbactam (UNASYN) IV 3 g (09/01/19 0850)  . dextrose 50 mL/hr at 09/01/19 0852  . levETIRAcetam      LOS: 16 days    Domenic Polite, MD [facility]

## 2019-09-01 NOTE — Progress Notes (Signed)
The pt's blood glucose level at 2030 was 56.  Since pt refuses to eat or drink, the nurse gave a full amp (50 mL) of IV 50% Dextrose fluid.  Thirty minutes after the added dextrose was administered, his blood glucose was at 127.  Will continue to monitor.  Harriet Masson, RN

## 2019-09-01 NOTE — Progress Notes (Addendum)
Civil engineer, contracting [facility]) Hospital Liaison Note:  Received request from Donn Pierini TOC/CM  for family interest in [facility]. Unfortnately [facility] is not able to offer a room today.  Chart reviewed. Unable to reach family via telephone to acknowledge referral. Left message on patient sister's telephone voicemail  to acknowledge referral, leave a call back number for this afternoon and stating that an [facility] Liaison will also follow up with family/TOC tomorrow about bed availability.    Please do not hesitate to call with questions. Thank you.   Roda Shutters, RN [facility] Liaison  [phone] [employer] are on [facility]  UPDATE:  Able to reach patient's sister Steward Drone to confirm interest in [facility] and make aware of no available beds this evening. Answered questions and discussed hospice services and philosophy and let her know [facility] will touch base with her in the morning about bed availability.

## 2019-09-01 NOTE — Progress Notes (Addendum)
Hypoglycemic Event  CBG: 42, recheck 51  Treatment: D50 50 mL (25 gm)  Symptoms: None  Follow-up CBG: Time: Now comfort care CBG Result:  Possible Reasons for Event: Inadequate meal intake  Comments/MD notified: Now comfort care    Philip Wells

## 2019-09-01 NOTE — Progress Notes (Addendum)
Palliative:  HPI: 64 y.o.malewith past medical history of unknown medical historyadmitted on 2/2/2021with PEA cardiac arrest receiving ROSC after 6 rounds of CPR followed by hypotension, unresponsiveness, atrial fibrillation RVR with septic shock due to Serratia pneumonia. He has been extubated and off vasopressors. Concern for aspiration with increasing oxygen requirementsand a tooth was extracted from his lungs via bronchoscopy 08/24/19 and was extubated again 08/27/19. Repeat right thoracentesis 08/29/19.   Mr. Barajas is sedated and unable to interact with me or converse with me. He continues to decline even more so since yesterday.   I called and spoke with sister, Steward Drone. Steward Drone has been in conversation with Dr. Jomarie Longs and Dr. Tonia Brooms this morning. She understands that Mr. Holloway continues to decline and prognosis is very poor. I explained that I feel he is at end of life and I agree that the best we can do to care for him is to ensure he is comfortable and not suffering. She agrees and does not want him to suffer. We discussed comfort care and visitation policy for end of life. We discussed hospice options and she does not feel they would be able to care for him at home and I agree that this would be difficult. We discussed transition to [facility] as the best option for end of life care.   All questions/concerns addressed. Emotional support provided. Discussed plan with Melvyn Neth, Dr. Jomarie Longs, Dr. Tonia Brooms.   Exam: Sedated, minimally responsive. Thin, frail. Temporal muscle wasting. HR afib. Breathing irregular. Abd flat.   Plan: - Comfort care. Orders changed to reflect comfort. PRN medications added to ensure comfort.  - Transition to [facility]. May have hospice death prior to transfer.   25 min  Yong Channel, NP Palliative Medicine Team Pager [phone] (Please see [email or web address] for schedule) Team Phone [phone]    Greater than 50%  of this time was spent counseling and  coordinating care related to the above assessment and plan

## 2019-09-02 MED ORDER — GLYCOPYRROLATE 1 MG PO TABS
1.0000 mg | ORAL_TABLET | ORAL | Status: DC
  Filled 2019-09-02 (×10): qty 1

## 2019-09-02 MED ORDER — LORAZEPAM 2 MG/ML IJ SOLN
0.5000 mg/h | INTRAVENOUS | Status: DC
  Administered 2019-09-02: 0.5 mg/h via INTRAVENOUS
  Filled 2019-09-02 (×2): qty 25

## 2019-09-02 MED ORDER — LORAZEPAM 2 MG/ML IJ SOLN
2.0000 mg | INTRAMUSCULAR | Status: AC
  Administered 2019-09-02: 15:00:00 2 mg via INTRAVENOUS

## 2019-09-02 MED ORDER — GLYCOPYRROLATE 0.2 MG/ML IJ SOLN
0.2000 mg | INTRAMUSCULAR | Status: DC
  Administered 2019-09-02 (×4): 0.2 mg via INTRAVENOUS
  Filled 2019-09-02 (×9): qty 1

## 2019-09-02 MED ORDER — LORAZEPAM BOLUS VIA INFUSION
0.5000 mg | INTRAVENOUS | Status: DC | PRN
  Filled 2019-09-02: qty 1

## 2019-09-02 MED ORDER — HYDROMORPHONE BOLUS VIA INFUSION
1.0000 mg | INTRAVENOUS | Status: DC | PRN
  Filled 2019-09-02: qty 1

## 2019-09-02 MED ORDER — GLYCOPYRROLATE 0.2 MG/ML IJ SOLN: 0.4000 mg | INTRAMUSCULAR | Status: DC

## 2019-09-02 MED ORDER — SODIUM CHLORIDE 0.9 % IV SOLN
1.0000 mg/h | INTRAVENOUS | Status: DC
  Administered 2019-09-02: 16:00:00 1 mg/h via INTRAVENOUS
  Filled 2019-09-02: qty 5

## 2019-09-02 MED ORDER — GLYCOPYRROLATE 0.2 MG/ML IJ SOLN
0.1000 mg | Freq: Once | INTRAMUSCULAR | Status: AC
  Administered 2019-09-02: 0.1 mg via INTRAVENOUS

## 2019-09-02 NOTE — Plan of Care (Signed)
Continue to monitor

## 2019-09-02 NOTE — Progress Notes (Signed)
Nutrition Brief Note  Chart reviewed. Pt now transitioning to comfort care.  No further nutrition interventions warranted at this time.  Please re-consult as needed.   Estelle Skibicki W, RD, LDN, CDCES Registered Dietitian II Certified Diabetes Care and Education Specialist Please refer to AMION for RD and/or RD on-call/weekend/after hours pager  

## 2019-09-02 NOTE — Progress Notes (Signed)
Palliative Medicine RN Note: Symptom check. There is not a bed available at [facility] today.  Patient has rec'd max doses of hydromorphone & lorazepam. RR mid-40s, HR in the 140s. Philip Wells is using all abdominal muscles to breathe and showing significant work of breathing, including perspiration, and he is grimacing. RN is at bedside. I spoke with Dr Phillips Odor.   While I was there, it was time for next dose of hydromorphone, and Dr Phillips Odor gave order for stat dose of lorazepam.   Chest tubes remain to water seal. Patient is not responsive to my voice or touch. I did not check pain response, considering comfort care status and his significant distress.   Dr Phillips Odor initiated lorazepam and hydromorphone infusions. RN called pharmacy while I was still in the room to confirm that they got the order and are starting on it. Philip Wells has a Mediport that is accessed and can be used for continuous infusions.  I am concerned about Philip Wells moving to a hospice facility as planned. I discussed this with Dr Phillips Odor, as well as with our NP Lamarr Lulas, who will be following him this weekend. Due to rapidly changing and increasing symptoms and the presence of chest tubes, PMT no longer supports the referral to residential hospice. Philip Wells is likely to experience significant pain, dyspnea, and distress during and after transport, as well as risking dislodging of his chest tubes, and it could take hours to resolve those symptoms. We discussed these concerns with his family, and they are not willing to risk that. Unless his family changes their minds, we recommend allowing a hospital death.   We have cancelled the referral to residential hospice. Should he survive the evening, we will follow up tomorrow.  Philip Chance Emmalee Solivan, RN, BSN, [facility] Palliative Medicine Team 09/09/2019 3:38 PM Office [phone]

## 2019-09-02 NOTE — Progress Notes (Signed)
Patient seen and examined -Now comfort care -Continue as needed Dilaudid and Robinul -Anticipate hospital demise  Zannie Cove, MD

## 2019-09-02 NOTE — Progress Notes (Addendum)
Pt expired at 2215, this RN and Eyvonne Left RN verified. Karlyne Greenspan (sister) was notified, and referral was made to CDS. All lines removed and pt transported to morgue, death certificate signed. Pt placement notified.  dilaudid gtt and ativan gtt wasted in stericycle w/ Corrie Mckusick RN witnessing

## 2019-09-02 NOTE — Consult Note (Addendum)
Chart reviewed and events noted.  At present juncture without infection we will plan to monitor and consider formal revision amputation in the weeks or months ahead as symptoms dictate.  I will be happy to see him office for follow-up if we get to that point.  Wentworth Edelen MD

## 2019-09-02 NOTE — Progress Notes (Signed)
Chaplain responded to rounding notification that Philip Wells was reaching end of life. Chaplain visited with Philip Wells who was resting and was able to respond by opening his eyes and moving his head slightly. Chaplain offered ministry of presence and prayer.  Chaplains remain available for support as needs arise.   Chaplain Resident, Amado Coe, M Div [phone] personal pager [phone] on-call chaplain

## 2019-09-03 LAB — CULTURE, BODY FLUID W GRAM STAIN -BOTTLE: Culture: NO GROWTH

## 2019-09-05 DIAGNOSIS — J189 Pneumonia, unspecified organism: Secondary | ICD-10-CM

## 2019-09-05 DIAGNOSIS — G931 Anoxic brain damage, not elsewhere classified: Secondary | ICD-10-CM | POA: Diagnosis present

## 2019-09-05 DIAGNOSIS — R6520 Severe sepsis without septic shock: Secondary | ICD-10-CM | POA: Diagnosis present

## 2019-09-05 DIAGNOSIS — J9602 Acute respiratory failure with hypercapnia: Secondary | ICD-10-CM | POA: Diagnosis present

## 2019-09-05 DIAGNOSIS — A419 Sepsis, unspecified organism: Secondary | ICD-10-CM | POA: Diagnosis present

## 2019-09-05 DIAGNOSIS — J690 Pneumonitis due to inhalation of food and vomit: Secondary | ICD-10-CM | POA: Diagnosis present

## 2019-09-05 DIAGNOSIS — J984 Other disorders of lung: Secondary | ICD-10-CM

## 2019-09-05 DIAGNOSIS — J9601 Acute respiratory failure with hypoxia: Secondary | ICD-10-CM | POA: Diagnosis present

## 2019-09-05 DIAGNOSIS — R410 Disorientation, unspecified: Secondary | ICD-10-CM | POA: Diagnosis present

## 2019-09-05 LAB — GLUCOSE, CAPILLARY: Glucose-Capillary: 42 mg/dL — CL (ref 70–99)

## 2019-09-12 NOTE — Discharge Summary (Signed)
Death Summary  Maxi Rodas Mcree OAC:[id number] DOB: [birth year] DOA: 09-05-19  PCP: Patient, No Pcp Per  Admit date: 2019/09/05 Date of Death: 09/22/19  Final Diagnoses:  Active Problems:   Cardiac arrest (HCC)   Hydropneumothorax   Pneumothorax   AKI (acute kidney injury) (HCC)   Encounter for chest tube placement   History of placement of chest tube   Septic shock (HCC)   Respiratory failure (HCC)   Severe protein-calorie malnutrition (HCC)   Pressure injury of skin   Palliative care by specialist   Aspiration pneumonia (HCC)   Acute respiratory failure with hypoxia and hypercapnia (HCC)   Sepsis with metabolic encephalopathy (HCC)   Acute delirium   Anoxic encephalopathy (HCC)   Cavitary pneumonia   History of present illness:  The patient is a 64 year old male with a PEA cardiac arrest and subsequent distributive shock presumably due to sepsis and A. fib with RVR, he was also found to have Serratia pneumonia. Treated with targeted temperature management on 08/18/2019 and continued pressor requirement.  Weaned off pressors on 2/8,  on 2/8 he was extubated but then on 2/10 he was reintubated with FOB tooth removal.  He still had atrial fibrillation so diltiazem was added.  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 fib RVR -Unlikely to be PE or ACS with relatively normal TTE, also preserved EF and normal wall motion -Initially had shock  requiring vasopressors in the ICU  Sepsis secondary to Serratia PNA -completed course cefepime on 2/9  Acute Hypoxemic Respiratory Failure requiring re-intubation status post 2 chest tubes Right pleural effusion status post thoracentesis Left hydropneumothorax Aspiration pneumonia -Admitted to the ICU intubated, failed extubation 2/8, aspirated 2/9 with swallow evaluation >worsening O2 needs after, re-intubated 2/10. FOB 2/10 for tooth extraction.  -Extubated 2/13  -Underwent thoracentesis 2/15 pleural fluid culture negative, fluid analysis consistent with transudate -Subsequently had been on IV Lasix for diuresis -Continues to have 2 chest tubes on waterseal,  repeat x-ray on 2/17 noted that pneumothorax is worse, CVTS consulted, not a candidate for VATS given anoxic encephalopathy -Follow-up CT chest 2/17 with large right upper lobe cavity with direct communication with the right upper bronchus, aspiration pneumonia and mucous, white count has bumped up to 28K this morning, started back on IV antibiotics -Prognosis remains very poor, explained to Stephanie Coup that intubation under this circumstance would be counterproductive -Followed by pulmonary Dr. Tonia Brooms as well, he also called patient's sister and recommended comfort care, hospice as we did not think patient would survive this hospitalization. -Subsequently seen by palliative care and transitioned to comfort care -He expired on 2022/09/21  Anoxic encephalopathy Acute metabolic encephalopathy with behavioral disturbances -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, CT head notes baseline cerebral atrophy  -In addition likely has delirium from prolonged hospitalization -Continued on Keppra twice daily  AKI Hypokalemia Hypernatremia  Hypomagnesemia  Atrial Fibrillation, rate controlled at present  Normocytic Anemia, stable   Severe protein calorie malnutrition  Dry gangrene of the left thumb  Time:  45min  Signed:  Domenic Polite  [facility] 09/05/2019, 1:50 PM

## 2019-09-12 DEATH — deceased

## 2021-12-09 IMAGING — DX DG CHEST 1V PORT
1 series · 1 of 1 positions shown · non-contrast
Comparison: 08/29/2019 at 5588 hours

CLINICAL DATA: Chest tube positions, evaluate for pneumothorax

EXAM:
PORTABLE CHEST 1 VIEW

[chest ap]
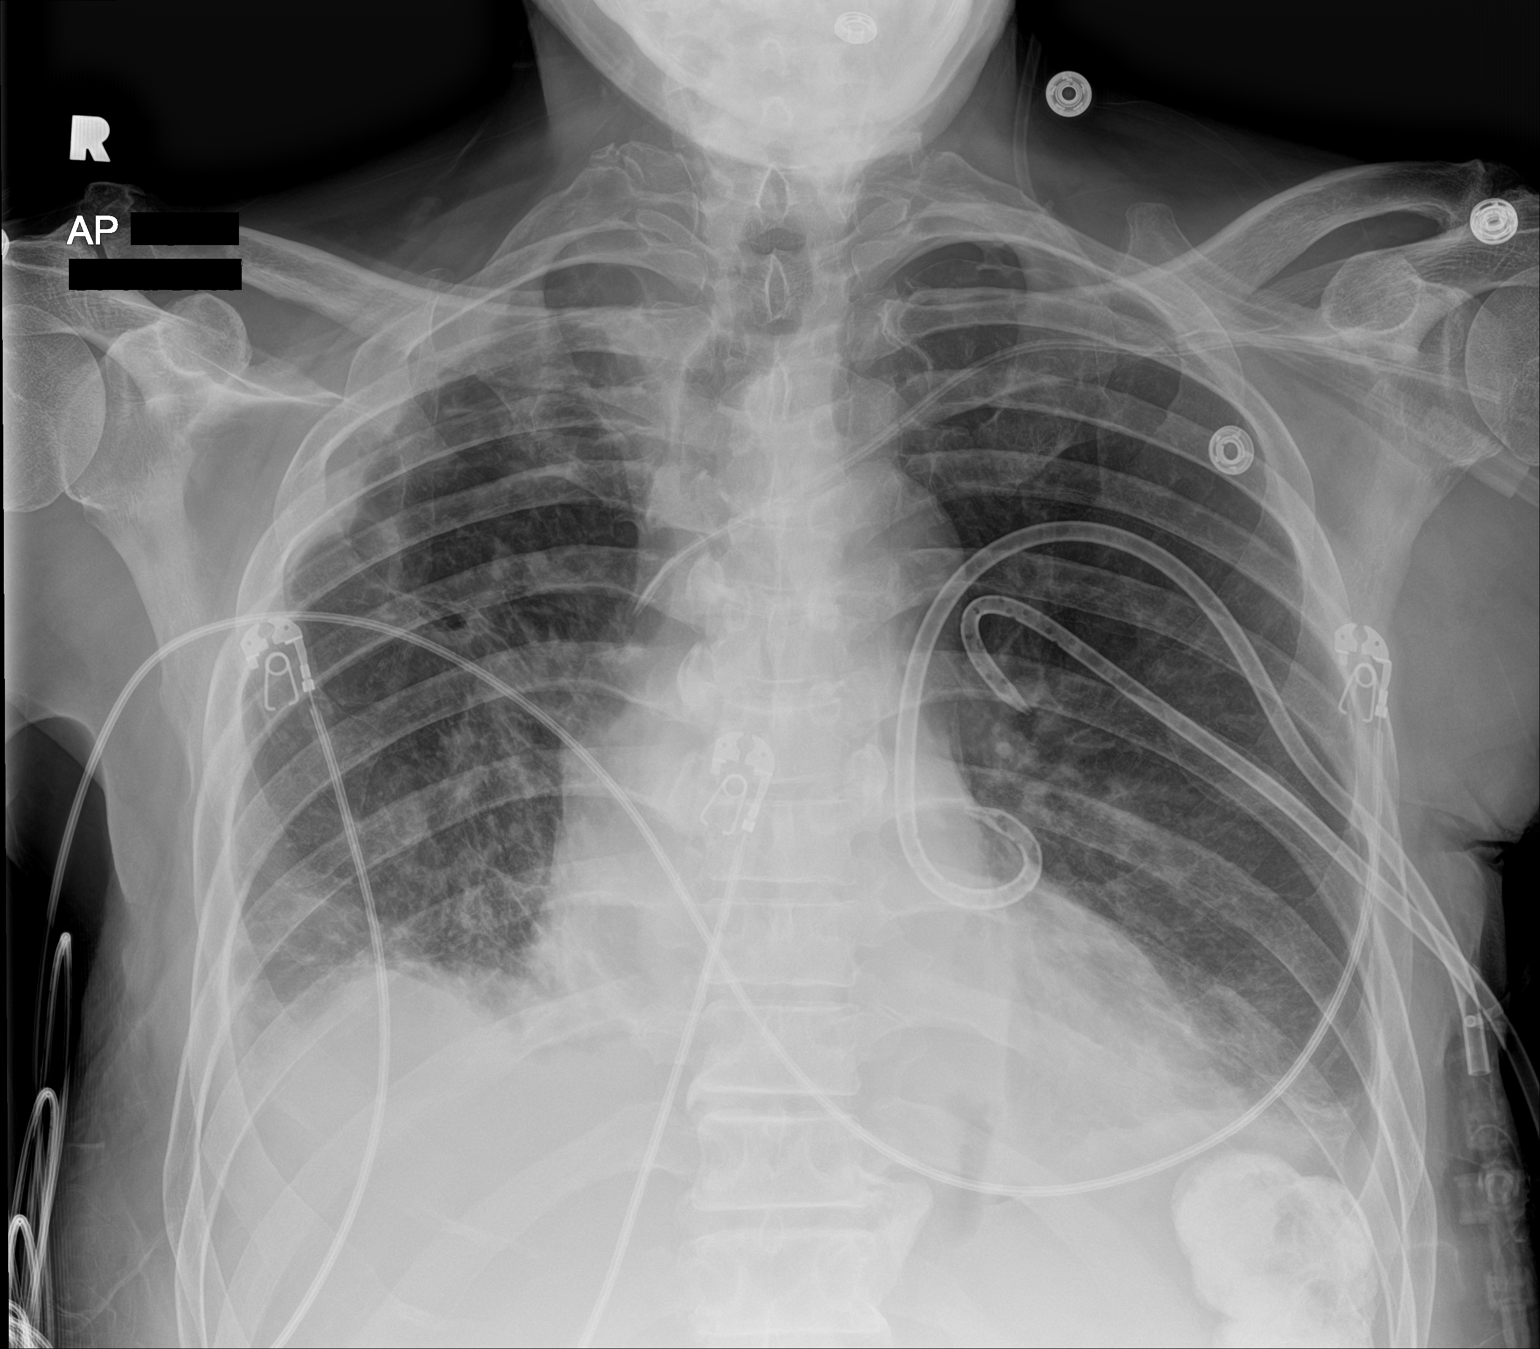

[1 of 1 positions shown; findings below may reference images not displayed]

FINDINGS: Two indwelling left chest tubes. Small left apical pneumothorax,
unchanged.

Small right pleural effusion and small left basilar pleural fluid
component.

Stable right upper lobe and bibasilar opacities.

Mild cardiomegaly.

Left subclavian venous catheter terminates in the mid SVC.
IMPRESSION: Two indwelling left chest tubes. Small left apical pneumothorax,
unchanged.

Additional stable findings as above.
